# Patient Record
Sex: Male | Born: 1965 | Race: Black or African American | Hispanic: No | Marital: Single | Smoking: Never smoker
Health system: Southern US, Community
[De-identification: ages and names within clinical notes are randomized; demographics above are authoritative.]

## PROBLEM LIST (undated history)

## (undated) DIAGNOSIS — I219 Acute myocardial infarction, unspecified: Secondary | ICD-10-CM

## (undated) DIAGNOSIS — Z9289 Personal history of other medical treatment: Secondary | ICD-10-CM

## (undated) DIAGNOSIS — I442 Atrioventricular block, complete: Secondary | ICD-10-CM

## (undated) DIAGNOSIS — I214 Non-ST elevation (NSTEMI) myocardial infarction: Secondary | ICD-10-CM

## (undated) DIAGNOSIS — F419 Anxiety disorder, unspecified: Secondary | ICD-10-CM

## (undated) DIAGNOSIS — F32A Depression, unspecified: Secondary | ICD-10-CM

## (undated) DIAGNOSIS — I10 Essential (primary) hypertension: Secondary | ICD-10-CM

## (undated) DIAGNOSIS — F329 Major depressive disorder, single episode, unspecified: Secondary | ICD-10-CM

## (undated) DIAGNOSIS — T8859XA Other complications of anesthesia, initial encounter: Secondary | ICD-10-CM

## (undated) DIAGNOSIS — T4145XA Adverse effect of unspecified anesthetic, initial encounter: Secondary | ICD-10-CM

## (undated) DIAGNOSIS — I255 Ischemic cardiomyopathy: Secondary | ICD-10-CM

## (undated) DIAGNOSIS — I639 Cerebral infarction, unspecified: Secondary | ICD-10-CM

## (undated) DIAGNOSIS — I499 Cardiac arrhythmia, unspecified: Secondary | ICD-10-CM

## (undated) DIAGNOSIS — N189 Chronic kidney disease, unspecified: Secondary | ICD-10-CM

## (undated) DIAGNOSIS — J189 Pneumonia, unspecified organism: Secondary | ICD-10-CM

## (undated) DIAGNOSIS — K219 Gastro-esophageal reflux disease without esophagitis: Secondary | ICD-10-CM

## (undated) DIAGNOSIS — H547 Unspecified visual loss: Secondary | ICD-10-CM

## (undated) HISTORY — PX: ARTERIOVENOUS GRAFT PLACEMENT: SUR1029

## (undated) HISTORY — PX: CARDIAC CATHETERIZATION: SHX172

## (undated) HISTORY — PX: PERITONEAL CATHETER REMOVAL: SUR1106

## (undated) HISTORY — PX: NEPHROSTOMY: SHX1014

## (undated) HISTORY — PX: PERITONEAL CATHETER INSERTION: SHX2223

## (undated) HISTORY — PX: COLON RESECTION: SHX5231

## (undated) HISTORY — PX: OTHER SURGICAL HISTORY: SHX169

## (undated) HISTORY — PX: KIDNEY TRANSPLANT: SHX239

---

## 1998-04-09 ENCOUNTER — Ambulatory Visit (HOSPITAL_COMMUNITY): Admission: RE | Admit: 1998-04-09 | Discharge: 1998-04-09 | Payer: Self-pay | Admitting: *Deleted

## 1998-04-10 ENCOUNTER — Ambulatory Visit (HOSPITAL_COMMUNITY): Admission: RE | Admit: 1998-04-10 | Discharge: 1998-04-10 | Payer: Self-pay | Admitting: *Deleted

## 1998-06-28 ENCOUNTER — Emergency Department (HOSPITAL_COMMUNITY): Admission: EM | Admit: 1998-06-28 | Discharge: 1998-06-28 | Payer: Self-pay | Admitting: Emergency Medicine

## 1998-09-14 ENCOUNTER — Ambulatory Visit: Admission: RE | Admit: 1998-09-14 | Discharge: 1998-09-14 | Payer: Self-pay | Admitting: Nephrology

## 1998-10-07 ENCOUNTER — Ambulatory Visit (HOSPITAL_COMMUNITY): Admission: RE | Admit: 1998-10-07 | Discharge: 1998-10-07 | Payer: Self-pay | Admitting: Vascular Surgery

## 1998-10-18 HISTORY — PX: ARTERIOVENOUS GRAFT PLACEMENT: SUR1029

## 1998-10-27 DIAGNOSIS — I219 Acute myocardial infarction, unspecified: Secondary | ICD-10-CM

## 1998-10-27 HISTORY — DX: Acute myocardial infarction, unspecified: I21.9

## 1998-11-06 ENCOUNTER — Inpatient Hospital Stay (HOSPITAL_COMMUNITY): Admission: RE | Admit: 1998-11-06 | Discharge: 1998-11-09 | Payer: Self-pay

## 1998-11-07 HISTORY — PX: ARTERIOVENOUS GRAFT PLACEMENT: SUR1029

## 1999-04-07 ENCOUNTER — Inpatient Hospital Stay (HOSPITAL_COMMUNITY): Admission: EM | Admit: 1999-04-07 | Discharge: 1999-04-13 | Payer: Self-pay | Admitting: Emergency Medicine

## 1999-04-07 ENCOUNTER — Encounter: Payer: Self-pay | Admitting: Nephrology

## 1999-04-12 ENCOUNTER — Encounter: Payer: Self-pay | Admitting: Emergency Medicine

## 1999-05-15 ENCOUNTER — Inpatient Hospital Stay (HOSPITAL_COMMUNITY): Admission: AD | Admit: 1999-05-15 | Discharge: 1999-05-18 | Payer: Self-pay | Admitting: Nephrology

## 2001-01-18 ENCOUNTER — Encounter: Payer: Self-pay | Admitting: General Surgery

## 2001-01-20 ENCOUNTER — Encounter: Payer: Self-pay | Admitting: General Surgery

## 2001-01-20 ENCOUNTER — Inpatient Hospital Stay (HOSPITAL_COMMUNITY): Admission: RE | Admit: 2001-01-20 | Discharge: 2001-01-30 | Payer: Self-pay | Admitting: General Surgery

## 2001-01-20 ENCOUNTER — Encounter (INDEPENDENT_AMBULATORY_CARE_PROVIDER_SITE_OTHER): Payer: Self-pay | Admitting: Specialist

## 2001-01-28 ENCOUNTER — Encounter: Payer: Self-pay | Admitting: General Surgery

## 2001-10-27 DIAGNOSIS — I639 Cerebral infarction, unspecified: Secondary | ICD-10-CM

## 2001-10-27 HISTORY — DX: Cerebral infarction, unspecified: I63.9

## 2004-08-10 ENCOUNTER — Ambulatory Visit: Payer: Self-pay | Admitting: Nephrology

## 2004-08-20 ENCOUNTER — Ambulatory Visit: Payer: Self-pay

## 2004-09-17 ENCOUNTER — Ambulatory Visit: Payer: Self-pay | Admitting: Nephrology

## 2004-09-26 ENCOUNTER — Ambulatory Visit: Payer: Self-pay | Admitting: Nephrology

## 2004-11-02 ENCOUNTER — Ambulatory Visit: Payer: Self-pay | Admitting: Nephrology

## 2004-11-05 ENCOUNTER — Ambulatory Visit: Payer: Self-pay | Admitting: Nephrology

## 2004-11-07 ENCOUNTER — Ambulatory Visit: Payer: Self-pay | Admitting: Nephrology

## 2004-11-12 ENCOUNTER — Ambulatory Visit: Payer: Self-pay | Admitting: Nephrology

## 2004-11-16 ENCOUNTER — Ambulatory Visit: Payer: Self-pay | Admitting: Nephrology

## 2004-11-21 ENCOUNTER — Ambulatory Visit: Payer: Self-pay | Admitting: Nephrology

## 2004-11-23 ENCOUNTER — Ambulatory Visit: Payer: Self-pay | Admitting: Nephrology

## 2004-11-26 ENCOUNTER — Ambulatory Visit: Payer: Self-pay | Admitting: Nephrology

## 2006-03-26 ENCOUNTER — Ambulatory Visit: Payer: Self-pay | Admitting: Nephrology

## 2007-03-04 ENCOUNTER — Ambulatory Visit: Payer: Self-pay | Admitting: Nephrology

## 2007-03-05 ENCOUNTER — Ambulatory Visit (HOSPITAL_COMMUNITY): Admission: RE | Admit: 2007-03-05 | Discharge: 2007-03-05 | Payer: Self-pay | Admitting: Nephrology

## 2007-05-08 ENCOUNTER — Ambulatory Visit: Payer: Self-pay | Admitting: Nephrology

## 2007-06-05 ENCOUNTER — Ambulatory Visit: Payer: Self-pay | Admitting: Nephrology

## 2007-06-08 ENCOUNTER — Ambulatory Visit: Payer: Self-pay | Admitting: Nephrology

## 2007-08-07 ENCOUNTER — Ambulatory Visit: Payer: Self-pay | Admitting: Nephrology

## 2007-10-16 ENCOUNTER — Ambulatory Visit: Payer: Self-pay | Admitting: Nephrology

## 2007-12-18 ENCOUNTER — Ambulatory Visit: Payer: Self-pay | Admitting: Nephrology

## 2008-03-25 ENCOUNTER — Ambulatory Visit: Payer: Self-pay | Admitting: Nephrology

## 2008-03-28 ENCOUNTER — Ambulatory Visit: Payer: Self-pay | Admitting: Nephrology

## 2008-09-30 ENCOUNTER — Ambulatory Visit: Payer: Self-pay | Admitting: Nephrology

## 2008-11-21 ENCOUNTER — Ambulatory Visit: Payer: Self-pay | Admitting: Nephrology

## 2008-12-22 ENCOUNTER — Ambulatory Visit: Payer: Self-pay | Admitting: Nephrology

## 2008-12-23 ENCOUNTER — Ambulatory Visit (HOSPITAL_COMMUNITY): Admission: RE | Admit: 2008-12-23 | Discharge: 2008-12-23 | Payer: Self-pay | Admitting: Nephrology

## 2008-12-25 ENCOUNTER — Ambulatory Visit (HOSPITAL_COMMUNITY): Admission: RE | Admit: 2008-12-25 | Discharge: 2008-12-25 | Payer: Self-pay | Admitting: Nephrology

## 2009-01-06 ENCOUNTER — Ambulatory Visit: Payer: Self-pay | Admitting: Nephrology

## 2009-02-02 ENCOUNTER — Ambulatory Visit: Payer: Self-pay | Admitting: Vascular Surgery

## 2009-02-06 ENCOUNTER — Ambulatory Visit: Payer: Self-pay | Admitting: Nephrology

## 2009-03-02 ENCOUNTER — Ambulatory Visit: Payer: Self-pay | Admitting: Vascular Surgery

## 2009-03-30 ENCOUNTER — Ambulatory Visit: Payer: Self-pay | Admitting: Vascular Surgery

## 2009-05-16 ENCOUNTER — Ambulatory Visit: Payer: Self-pay | Admitting: Vascular Surgery

## 2009-08-17 ENCOUNTER — Ambulatory Visit: Payer: Self-pay | Admitting: Vascular Surgery

## 2009-08-29 ENCOUNTER — Inpatient Hospital Stay (HOSPITAL_COMMUNITY): Admission: EM | Admit: 2009-08-29 | Discharge: 2009-09-05 | Payer: Self-pay | Admitting: Emergency Medicine

## 2009-09-06 ENCOUNTER — Ambulatory Visit: Payer: Self-pay | Admitting: Nephrology

## 2009-09-17 ENCOUNTER — Ambulatory Visit: Payer: Self-pay | Admitting: Nephrology

## 2009-11-01 ENCOUNTER — Ambulatory Visit: Payer: Self-pay | Admitting: Nephrology

## 2009-11-05 ENCOUNTER — Ambulatory Visit: Payer: Self-pay | Admitting: Vascular Surgery

## 2009-11-21 ENCOUNTER — Ambulatory Visit: Payer: Self-pay | Admitting: Vascular Surgery

## 2009-11-30 ENCOUNTER — Ambulatory Visit: Payer: Self-pay | Admitting: Vascular Surgery

## 2009-11-30 ENCOUNTER — Ambulatory Visit (HOSPITAL_COMMUNITY): Admission: RE | Admit: 2009-11-30 | Discharge: 2009-11-30 | Payer: Self-pay | Admitting: Vascular Surgery

## 2009-12-28 ENCOUNTER — Ambulatory Visit (HOSPITAL_COMMUNITY): Admission: RE | Admit: 2009-12-28 | Discharge: 2009-12-28 | Payer: Self-pay | Admitting: Nephrology

## 2010-01-07 ENCOUNTER — Ambulatory Visit: Payer: Self-pay | Admitting: Surgery

## 2010-01-08 ENCOUNTER — Ambulatory Visit: Payer: Self-pay | Admitting: Nephrology

## 2010-02-01 ENCOUNTER — Ambulatory Visit: Payer: Self-pay | Admitting: Pulmonary Disease

## 2010-02-01 ENCOUNTER — Inpatient Hospital Stay (HOSPITAL_COMMUNITY): Admission: EM | Admit: 2010-02-01 | Discharge: 2010-02-09 | Payer: Self-pay | Admitting: Emergency Medicine

## 2010-02-01 ENCOUNTER — Ambulatory Visit: Payer: Self-pay | Admitting: Cardiovascular Disease

## 2010-02-04 ENCOUNTER — Ambulatory Visit: Payer: Self-pay | Admitting: Infectious Diseases

## 2010-02-14 ENCOUNTER — Ambulatory Visit: Payer: Self-pay | Admitting: Nephrology

## 2010-03-08 ENCOUNTER — Ambulatory Visit (HOSPITAL_COMMUNITY)
Admission: RE | Admit: 2010-03-08 | Discharge: 2010-03-08 | Payer: Self-pay | Source: Home / Self Care | Admitting: Nephrology

## 2010-04-03 ENCOUNTER — Ambulatory Visit: Payer: Self-pay | Admitting: Vascular Surgery

## 2010-04-10 ENCOUNTER — Ambulatory Visit: Payer: Self-pay | Admitting: Vascular Surgery

## 2010-04-19 ENCOUNTER — Ambulatory Visit (HOSPITAL_COMMUNITY): Admission: RE | Admit: 2010-04-19 | Discharge: 2010-04-19 | Payer: Self-pay | Admitting: Vascular Surgery

## 2010-05-01 ENCOUNTER — Emergency Department: Payer: Self-pay | Admitting: Emergency Medicine

## 2010-05-01 ENCOUNTER — Inpatient Hospital Stay: Payer: Self-pay | Admitting: Internal Medicine

## 2010-05-05 ENCOUNTER — Inpatient Hospital Stay (HOSPITAL_COMMUNITY): Admission: EM | Admit: 2010-05-05 | Discharge: 2010-05-16 | Payer: Self-pay | Admitting: Pulmonary Disease

## 2010-05-05 ENCOUNTER — Ambulatory Visit: Payer: Self-pay | Admitting: Surgery

## 2010-05-05 ENCOUNTER — Ambulatory Visit: Payer: Self-pay | Admitting: Pulmonary Disease

## 2010-07-30 ENCOUNTER — Ambulatory Visit (HOSPITAL_COMMUNITY): Admission: RE | Admit: 2010-07-30 | Discharge: 2010-07-30 | Payer: Self-pay | Admitting: Vascular Surgery

## 2010-07-30 ENCOUNTER — Ambulatory Visit: Payer: Self-pay | Admitting: Vascular Surgery

## 2010-08-01 ENCOUNTER — Ambulatory Visit (HOSPITAL_COMMUNITY): Admission: RE | Admit: 2010-08-01 | Discharge: 2010-08-01 | Payer: Self-pay | Admitting: Vascular Surgery

## 2010-08-09 ENCOUNTER — Ambulatory Visit: Payer: Self-pay | Admitting: Vascular Surgery

## 2010-08-19 ENCOUNTER — Ambulatory Visit (HOSPITAL_COMMUNITY): Admission: RE | Admit: 2010-08-19 | Discharge: 2010-08-19 | Payer: Self-pay | Admitting: Vascular Surgery

## 2010-09-06 ENCOUNTER — Ambulatory Visit: Payer: Self-pay | Admitting: Vascular Surgery

## 2010-10-11 ENCOUNTER — Ambulatory Visit (HOSPITAL_COMMUNITY): Admission: RE | Admit: 2010-10-11 | Payer: Self-pay | Source: Home / Self Care | Admitting: Vascular Surgery

## 2010-11-17 ENCOUNTER — Encounter: Payer: Self-pay | Admitting: Nephrology

## 2010-11-22 ENCOUNTER — Ambulatory Visit: Admit: 2010-11-22 | Payer: Self-pay | Admitting: Vascular Surgery

## 2010-11-30 ENCOUNTER — Emergency Department: Payer: Self-pay | Admitting: Emergency Medicine

## 2010-12-04 ENCOUNTER — Inpatient Hospital Stay (HOSPITAL_COMMUNITY)
Admission: RE | Admit: 2010-12-04 | Discharge: 2010-12-04 | DRG: 314 | Disposition: A | Discharge status: Home / Self Care | Payer: Medicare Other | Source: Ambulatory Visit | Attending: Vascular Surgery | Admitting: Vascular Surgery

## 2010-12-04 ENCOUNTER — Inpatient Hospital Stay (HOSPITAL_COMMUNITY): Payer: Medicare Other

## 2010-12-04 DIAGNOSIS — H548 Legal blindness, as defined in USA: Secondary | ICD-10-CM | POA: Diagnosis present

## 2010-12-04 DIAGNOSIS — T80212A Local infection due to central venous catheter, initial encounter: Secondary | ICD-10-CM

## 2010-12-04 DIAGNOSIS — Y849 Medical procedure, unspecified as the cause of abnormal reaction of the patient, or of later complication, without mention of misadventure at the time of the procedure: Secondary | ICD-10-CM | POA: Diagnosis present

## 2010-12-04 DIAGNOSIS — N186 End stage renal disease: Secondary | ICD-10-CM

## 2010-12-04 DIAGNOSIS — T82898A Other specified complication of vascular prosthetic devices, implants and grafts, initial encounter: Secondary | ICD-10-CM

## 2010-12-04 DIAGNOSIS — I509 Heart failure, unspecified: Secondary | ICD-10-CM | POA: Diagnosis present

## 2010-12-04 DIAGNOSIS — Z9861 Coronary angioplasty status: Secondary | ICD-10-CM

## 2010-12-04 DIAGNOSIS — I120 Hypertensive chronic kidney disease with stage 5 chronic kidney disease or end stage renal disease: Secondary | ICD-10-CM

## 2010-12-04 DIAGNOSIS — Z940 Kidney transplant status: Secondary | ICD-10-CM

## 2010-12-04 DIAGNOSIS — I2510 Atherosclerotic heart disease of native coronary artery without angina pectoris: Secondary | ICD-10-CM | POA: Diagnosis present

## 2010-12-04 LAB — POCT I-STAT 4, (NA,K, GLUC, HGB,HCT)
Glucose, Bld: 98 mg/dL (ref 70–99)
Sodium: 138 mEq/L (ref 135–145)

## 2010-12-04 LAB — PROTIME-INR: Prothrombin Time: 16.2 seconds — ABNORMAL HIGH (ref 11.6–15.2)

## 2010-12-04 LAB — SURGICAL PCR SCREEN: Staphylococcus aureus: NEGATIVE

## 2010-12-06 LAB — CATH TIP CULTURE

## 2010-12-11 NOTE — Op Note (Signed)
NAME:  [name] NO.:  [id number]  MEDICAL RECORD NO.:  [id number]           PATIENT TYPE:  I  LOCATION:  2899                         FACILITY:  [facility]  PHYSICIAN:  Fransisco Hertz, MD       DATE OF BIRTH:  [birth year]  DATE OF PROCEDURE:  12/04/2010 DATE OF DISCHARGE:  12/04/2010                              OPERATIVE REPORT   PROCEDURE:  Exchange of right femoral tunneled dialysis catheter x 2.  PREOPERATIVE DIAGNOSIS:  Possible infected right tunneled dialysis catheter.  POSTOPERATIVE DIAGNOSIS:  Possible infected right tunneled dialysis catheter.  SURGEON:  Arlys John L. Imogene Burn, MD  ANESTHESIA:  Monitored anesthesia care and local.  ESTIMATED BLOOD LOSS:  Minimal.  FINDINGS: tips of the tunneled dialysis catheter in the right atrium and early clotting of first tunneled dialysis catheter placed.  SPECIMEN: tip of tunneled dialysis catheter for anaerobic and aerobic cultures.  INDICATIONS:  This is a 45 year old gentleman who has end-stage renal disease requiring hemodialysis.  He previously has undergone bilateral upper arm graft placements.  At this point, his options in his upper arm are exhausted.  He also had a right femoral graft placed previously that has thrombosed.  His only option at this point is a left thigh graft.  He previously has had a right femoral tunneled dialysis catheter placed. Per discussion with the patient, he was currently getting antibiotics with dialysis with concern of possible graft infection.  This morning also he had a mild temperature at 100.2 F.  I had concerns with placement  of a thigh graft in the setting of possibly infected tunneled dialysis catheter. So I discussed with this patient exchanging out the tunneled dialysis catheter  and possibly placement of a temporary dialysis catheter if I felt that the findings  were consistent with an infected tunneled dialysis catheter.  He was fine with proceeding  with just an  exchange of the catheter.  DESCRIPTION OF OPERATION:  After full informed written consent was obtained, the patient was brought back to the operating room, placed supine upon the operating table.  He was prepped and draped in the standard fashion for right femoral tunneled dialysis catheter exchange. The entirety of the catheter was prepped into the field.  I had originally planned to possibly cut down onto the catheter, but it became evident immediately that the radiologists had placed this tunneled dialysis catheter immediately adjacent to the previous arteriovenous graft and that if I cut down to the catheter I would in fact be exposing the femoral graft, so this was not an option.  I opened one of the ports of this catheter. It became apparent to me that the blue port was in fact clotted, the red port however had blood flow that could be aspirated, so I passed a wire up through this port.  Under fluoroscopic guidance, this wire came out the end of the catheter up into the right atrium/inferior vena cava junction.  Over the wire, this catheter was removed leaving the wire in place.  I then at this point had a 55-cm Diatek catheter placed over the wire.  This  was advanced over the wire back into the inferior vena cava and under fluoroscopic guidance advanced back into the right atrium/inferior vena cava boundary.  At this point, then I transected the back end of this catheter revealing the two lumens.  The two ports were then docked onto this and then the catheter hub was then screwed into place.  I aspirated the red port.  It appeared flush and aspirated without any difficulties.  The blue port, however, initially I was sucking essential clot out of this and eventually it clotted off altogether and I was no longer able to aspirate.  At this point, I had some concern that there was presence of some clot within the catheter and then flushing it out would in fact result in  embolization, so I drew the red port which was aspirated and flushed without any difficulties.  I then placed a wire through this catheter.  This catheter was then exchanged for another catheter.  The removed catheter demonstrated clot  at the end of the catheter.  In a similar fashion, I had placed a new 55-cm Diatek  catheter over the wire and advanced it back in the right atrium.  In a similar  fashion, I transected the back end of this catheter and connected it to the two ports, and then at this point I was able to flush and aspirate both lumens of this catheter.  Initially, I did once again get a small amount of clot in the blue port after rotating it slightly; however, I was able to aspirate freely and flush without any difficulties.  Each port was then flushed with heparinized saline.  At this point immediately loaded each port with concentrated heparin at 1000 units per milliliter at the manufacturer recommended volumes and sterilely capped off each port.  I then secured this catheter in place with the cuff about a centimeter away from the exit point with interrupted stitches of 3-0 nylon tied to the catheter.  The patient tolerated this without any difficulties and then we applied sterile bandages to his right groin.  COMPLICATIONS:  None.  CONDITION:  Stable.  I then after completing this case, I called the patient's nephrologist and discussed the findings on this case.  The feeling was that there was no clear-cut evidence of an infected tunneled dialysis catheter.  I had passed off the tops of the original tunneled dialysis catheter as a specimen  for anaerobic and aerobic cultures, so we will be awaiting the outcomes of  those cultures.  Meanwhile, the nephrologist will continue with outpatient  dialysis and antibiotics during that period.  I also strongly recommended the  patient once again resume his Coumadin.  I discussed the findings of clot on  aspiration of the  catheter with the nephrologist.  They felt that the best  route of action in this case would be to have him resume his anticoagulation. Unfortunately due to the patient's end-stage renal status, he is not a candidate for the subcutaneous administration of either Lovenox or Arixtra.  Additionally as he is an access challenge and his only other remaining easily accessible access would be his left femoral vein which is the vein to be used for his permanent access.  Placement of a access in this vein would compromise that surgical option.  Placement of such access would have been necessary to start him on IV heparin, so I discussed options with the nephrologist and the feeling at this point was that he would best off  just being anticoagulated on Coumadin.     Fransisco Hertz, MD     BLC/MEDQ  D:  12/04/2010  T:  12/05/2010  Job:  [id number]  Electronically Signed by Leonides Sake MD on 12/11/2010 10:35:41 AM

## 2010-12-14 ENCOUNTER — Other Ambulatory Visit: Payer: Self-pay | Admitting: Nephrology

## 2010-12-19 ENCOUNTER — Other Ambulatory Visit: Payer: Self-pay

## 2010-12-20 ENCOUNTER — Ambulatory Visit: Payer: Medicare Other | Admitting: Vascular Surgery

## 2011-01-01 ENCOUNTER — Inpatient Hospital Stay (HOSPITAL_COMMUNITY): Payer: Medicare Other

## 2011-01-01 ENCOUNTER — Inpatient Hospital Stay (HOSPITAL_COMMUNITY)
Admission: RE | Admit: 2011-01-01 | Discharge: 2011-01-02 | DRG: 673 | Disposition: A | Discharge status: Home / Self Care | Payer: Medicare Other | Source: Ambulatory Visit | Attending: Vascular Surgery | Admitting: Vascular Surgery

## 2011-01-01 DIAGNOSIS — Z992 Dependence on renal dialysis: Secondary | ICD-10-CM

## 2011-01-01 DIAGNOSIS — Z9861 Coronary angioplasty status: Secondary | ICD-10-CM

## 2011-01-01 DIAGNOSIS — T82898A Other specified complication of vascular prosthetic devices, implants and grafts, initial encounter: Secondary | ICD-10-CM

## 2011-01-01 DIAGNOSIS — N186 End stage renal disease: Secondary | ICD-10-CM | POA: Diagnosis present

## 2011-01-01 DIAGNOSIS — E875 Hyperkalemia: Secondary | ICD-10-CM | POA: Diagnosis present

## 2011-01-01 DIAGNOSIS — I120 Hypertensive chronic kidney disease with stage 5 chronic kidney disease or end stage renal disease: Principal | ICD-10-CM | POA: Diagnosis present

## 2011-01-01 DIAGNOSIS — N2581 Secondary hyperparathyroidism of renal origin: Secondary | ICD-10-CM | POA: Diagnosis present

## 2011-01-01 DIAGNOSIS — D649 Anemia, unspecified: Secondary | ICD-10-CM | POA: Diagnosis present

## 2011-01-01 DIAGNOSIS — I2510 Atherosclerotic heart disease of native coronary artery without angina pectoris: Secondary | ICD-10-CM | POA: Diagnosis present

## 2011-01-01 LAB — POCT I-STAT 4, (NA,K, GLUC, HGB,HCT)
Glucose, Bld: 89 mg/dL (ref 70–99)
Hemoglobin: 10.2 g/dL — ABNORMAL LOW (ref 13.0–17.0)
Potassium: 4.3 mEq/L (ref 3.5–5.1)

## 2011-01-02 ENCOUNTER — Inpatient Hospital Stay (HOSPITAL_COMMUNITY): Payer: Medicare Other

## 2011-01-02 LAB — CBC
HCT: 29.9 % — ABNORMAL LOW (ref 39.0–52.0)
Hemoglobin: 9 g/dL — ABNORMAL LOW (ref 13.0–17.0)
RBC: 3.18 MIL/uL — ABNORMAL LOW (ref 4.22–5.81)
RDW: 17.1 % — ABNORMAL HIGH (ref 11.5–15.5)
WBC: 7.2 10*3/uL (ref 4.0–10.5)

## 2011-01-02 LAB — POTASSIUM: Potassium: 3.1 mEq/L — ABNORMAL LOW (ref 3.5–5.1)

## 2011-01-03 LAB — RENAL FUNCTION PANEL
Chloride: 97 mEq/L (ref 96–112)
Glucose, Bld: 74 mg/dL (ref 70–99)
Phosphorus: 5.1 mg/dL — ABNORMAL HIGH (ref 2.3–4.6)
Potassium: >7.5 mEq/L (ref 3.5–5.1)
Sodium: 135 mEq/L (ref 135–145)

## 2011-01-03 NOTE — Op Note (Signed)
NAME:  [name], [name] NO.:  [id number]  MEDICAL RECORD NO.:  [id number]           PATIENT TYPE:  I  LOCATION:  2002                         FACILITY:  [facility]  PHYSICIAN:  Fransisco Hertz, MD       DATE OF BIRTH:  Nov 15, 1965  DATE OF PROCEDURE:  01/01/2011 DATE OF DISCHARGE:                              OPERATIVE REPORT   PROCEDURE:   1. Creation of left thigh arteriovenous graft and inferior vena cavogram. 2. Left iliac venogram 3. Inferior vena cavogram  SURGEON:  Fransisco Hertz, MD  ASSISTANT:  Della Goo, PA-C  ANESTHESIA:  General.  FINDINGS: at the end of the case a palpable pulse in his left foot and also a palpable thrill in his femoral vein.  SPECIMENS:  None.  ESTIMATED BLOOD LOSS:  100 mL.  COMPLICATIONS:  None.  CONDITION:  Stable.  INDICATIONS:  This is a 45 year old gentleman who has now unfortunately undergone multiple bilateral upper extremity access procedures and also right thigh graft.  His only remaining option was placement of a left thigh graft.  Previously, I performed venograms on him that demonstrated patent iliac venous system even though there is an area of narrowing about 6 mm in the left iliac system.  Unfortunately, this gentleman continues to have multiple medical problems which have resulted in cancelation of nearly three of his previous attempts at placing the thigh graft.  He came in today finely afebrile, with no active signs of any type of infection.  I felt due to some issues in regard to clot from a previous right femoral tunneled dialysis exchange that an inferior vena cavogram would be necessary in order to determine whether or not placement of this graft would be of any value to him as if he had an inferior vena caval stenosis or thrombosis that any graft placement would be fundamentally flawed.  He is aware of the risks of this procedure includes bleeding, infection, possible embolization of clot to his  lung, possible steal syndrome resulting in malperfusion of his left leg, possible need for additional procedures, and possible nerve damage.  He is aware of all these risks and agreed to proceed forward with such.  DESCRIPTION OF THE OPERATION:  After full informed written consent was obtained from the patient, he was brought back to the operating room, placed supine upon the operating table.  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  Both of his legs had to be propped upon pillows as he could not fully extend his leg.  There is a permanent contracture in both legs and arms, so extensive amount of cushioning was needed in order to secure his body from developing any contracture-related injuries.  Prior to induction, he had received IV antibiotics.  He was then prepped and draped in the standard fashion for a left groin and thigh graft after obtaining adequate anesthesia, which was difficult also.  I turned my attention first to his left groin.  I made a longitudinal incision over where I felt the common femoral artery to be. Using  blunt dissection and electrocautery, I developed a plane into the area of the femoral sheath.  Note, there was significant scar tissue everywhere.  Due to his fibrosis related to his gadolinium reaction, this was the equivalent of a redo exploration and this dissection was extremely difficult.  Eventually, I was able to identify the femoral sheath and opened it up.  I was able to dissect out first the common femoral vein and then dissected it distally down to the saphenofemoral junction. The saphenous vein was noted to be relatively small at about 4 mm so I did not feel I could use the distal saphenous as inflow.  I was able to dissect out enough of the saphenous vein entereing into the common femoral vein  to utilize a branching point which I felt that once spatulated would be about  5-6 mm in diameter.  I at this point cannulated the saphenous vein distal to this point  that I was going to use as the outflow and then placed in a microwire through the microneedle and then exchanged the micro needle for the micro sheath, connected the extension tubing and then I completed this patient's inferior vena cavogram and also the left iliac venogram.  This demonstrated no obvious stenosis or any clot within this patient's superior vena cava.  At this point, I felt that his outflow was patent, so at this point it made sense to make an attempt at placement of this graft.  It took almost another 2 hours to dissect out the common femoral artery.  This is extremely difficult and multiple venous or arterial side  branches had to be repaired in this dissection.  Eventually, I was able  to get down to a segment of the common femoral artery just distal to the inguinal ligament that was patent without any disease.  At this point, I marked out on the skin the route for a thigh graft, and took a 6-mm Propaten graft, stretched to the full length, and then marked out the route.  I then made stab incisions medially and laterally and dissected little pockets.  I then using metal tunneler dissected from the lateral tunnel to the groin and then placed the metal tunnel.  I then passed the graft from the groin to the lateral incision. Then taking a curved metal tunneler, I dissected from the lateral incision to the medial incision placing the metal tunneler and then placed a graft through this metal tunnel.  Then, I finally completed the route by tunneling from the groin to the medial incision and then passed the graft.  In this process, I maintained orientation of the graft.  The patient was given 5000 units of heparin intravenously for a therapeutic bolus.  After waiting 3 minutes, then I clamped the artery proximally and distally, and the left common femoral artery and made an arteriotomy with  11 blade, extended it with Potts scissor for about a 6-mm arteriotomy, spatulated the graft, and meet the dimensions of this arteriotomy.  The graft was sewn to the artery in an end-to-side fashion with running stitch of 5-0 Prolene.  I had completed this anastomosis. Prior to completing it, I pulled up on the suture line with a nerve hook and then tied it down as usual.  I then allowed the graft to pressurize. After releasing all the clamps, there was good pulsatile flow throughout this graft.  At this point, then I clamped the graft near its arterial anastomosis and then sucked out all the blood,  then instilled heparinized saline throughout this graft.  At this point, then I turned my attention to the saphenous vein.  I clamped the saphenous vein as it entered into the saphenofemoral junction and I removed the sheath, and at this point I tied off the two branches of the saphenous vein and then transected the saphenous vein.  I opened up the proximal saphenous vein through this bifurcation point and this was easily a lumen greater than 6 mm.  I then passed a 5-mm dilator through this open saphenous vein. There was a little bit of clot that came out immediately and a little bit of resistance with passing the 5 mm, but after allowing the clot out and readjusting where I clamped it, actually the 5 mm passed without any difficulties.  There was good backbleeding from the vein, so at this point I reclamped the saphenofemoral junction in a more deep position. I irrigated out the saphenous vein and then I anastomosed the graft to the vein in end-to-end fashion after cutting the graft to appropriate length.  Prior to completing anastomosis, I allowed the vein to back bleed  and I blew out the graft in an antegrade fashion and then filled the graft up with heparinized saline.  At this point, the anastomosis was completed in the usual fashion.  I then released all the clamps and there  was immediately a good thrill in the femoral vein and also there was a palpable pulse in the graft.  At this point, we applied thrombin and Gelfoam to all anastomoses, gave the patient 15 mg of protamine initially to start reversing the heparin.  After waiting another 3 minutes, there continued to be some raw surface bleeding, so I gave another 15 mg of protamine and then irrigated out the wound and applied more thrombin and Gelfoam.  There were few areas of diffuse surface bleeding that I controlled with electrocautery.  At this point, I irrigated out the groin.  There was no more active bleeding.  There was some limited raw surface bleeding which I felt would stop with reapproximation of tissue, so then I repaired the left groin with a double layer of 2-0 Vicryl in a running fashion.  Then, I placed an interrupted layer of 3-0 Vicryl in the subdermal tissue to reapproximate that tissue.  I then closed the skin with a running 4-0 Monocryl in a running subcuticular fashion.  The skin was then cleaned, dried at the groin and reinforced with Dermabond.  I then turned my attention to the two stab incisions that were repaired with a layer of 3-0 Vicryl and then the skin reapproximated with running subcuticular of 4-0 Monocryl.  The skin was cleaned, dried, and also reinforced with Dermabond.  COMPLICATIONS:  None.  CONDITION:  Stable. Fransisco Hertz, MD     BLC/MEDQ  D:  01/01/2011  T:  01/02/2011  Job:  [id number]  Electronically Signed by Leonides Sake MD on 01/02/2011 10:19:31 AM

## 2011-01-04 ENCOUNTER — Inpatient Hospital Stay: Payer: Self-pay | Admitting: Internal Medicine

## 2011-01-05 ENCOUNTER — Inpatient Hospital Stay (HOSPITAL_COMMUNITY): Payer: Medicare Other

## 2011-01-05 ENCOUNTER — Inpatient Hospital Stay (HOSPITAL_COMMUNITY)
Admission: AD | Admit: 2011-01-05 | Discharge: 2011-01-17 | DRG: 314 | Disposition: A | Discharge status: Home Health | Payer: Medicare Other | Source: Other Acute Inpatient Hospital | Attending: Internal Medicine | Admitting: Internal Medicine

## 2011-01-05 DIAGNOSIS — T827XXA Infection and inflammatory reaction due to other cardiac and vascular devices, implants and grafts, initial encounter: Principal | ICD-10-CM | POA: Diagnosis present

## 2011-01-05 DIAGNOSIS — R894 Abnormal immunological findings in specimens from other organs, systems and tissues: Secondary | ICD-10-CM | POA: Diagnosis present

## 2011-01-05 DIAGNOSIS — A419 Sepsis, unspecified organism: Secondary | ICD-10-CM | POA: Diagnosis present

## 2011-01-05 DIAGNOSIS — Z9861 Coronary angioplasty status: Secondary | ICD-10-CM

## 2011-01-05 DIAGNOSIS — I120 Hypertensive chronic kidney disease with stage 5 chronic kidney disease or end stage renal disease: Secondary | ICD-10-CM | POA: Diagnosis present

## 2011-01-05 DIAGNOSIS — Y841 Kidney dialysis as the cause of abnormal reaction of the patient, or of later complication, without mention of misadventure at the time of the procedure: Secondary | ICD-10-CM | POA: Diagnosis present

## 2011-01-05 DIAGNOSIS — T8131XA Disruption of external operation (surgical) wound, not elsewhere classified, initial encounter: Secondary | ICD-10-CM | POA: Diagnosis not present

## 2011-01-05 DIAGNOSIS — N039 Chronic nephritic syndrome with unspecified morphologic changes: Secondary | ICD-10-CM | POA: Diagnosis present

## 2011-01-05 DIAGNOSIS — D631 Anemia in chronic kidney disease: Secondary | ICD-10-CM | POA: Diagnosis present

## 2011-01-05 DIAGNOSIS — R7989 Other specified abnormal findings of blood chemistry: Secondary | ICD-10-CM

## 2011-01-05 DIAGNOSIS — N186 End stage renal disease: Secondary | ICD-10-CM | POA: Diagnosis present

## 2011-01-05 DIAGNOSIS — I252 Old myocardial infarction: Secondary | ICD-10-CM

## 2011-01-05 DIAGNOSIS — H543 Unqualified visual loss, both eyes: Secondary | ICD-10-CM | POA: Diagnosis present

## 2011-01-05 DIAGNOSIS — R6520 Severe sepsis without septic shock: Secondary | ICD-10-CM | POA: Diagnosis present

## 2011-01-05 DIAGNOSIS — G4700 Insomnia, unspecified: Secondary | ICD-10-CM | POA: Diagnosis not present

## 2011-01-05 DIAGNOSIS — E785 Hyperlipidemia, unspecified: Secondary | ICD-10-CM | POA: Diagnosis present

## 2011-01-05 DIAGNOSIS — K219 Gastro-esophageal reflux disease without esophagitis: Secondary | ICD-10-CM | POA: Diagnosis present

## 2011-01-05 DIAGNOSIS — I509 Heart failure, unspecified: Secondary | ICD-10-CM | POA: Diagnosis present

## 2011-01-05 DIAGNOSIS — N2581 Secondary hyperparathyroidism of renal origin: Secondary | ICD-10-CM | POA: Diagnosis present

## 2011-01-05 DIAGNOSIS — Z992 Dependence on renal dialysis: Secondary | ICD-10-CM

## 2011-01-05 DIAGNOSIS — R6521 Severe sepsis with septic shock: Secondary | ICD-10-CM

## 2011-01-05 DIAGNOSIS — I2510 Atherosclerotic heart disease of native coronary artery without angina pectoris: Secondary | ICD-10-CM | POA: Diagnosis present

## 2011-01-05 DIAGNOSIS — I4891 Unspecified atrial fibrillation: Secondary | ICD-10-CM | POA: Diagnosis present

## 2011-01-05 LAB — CBC
HCT: 28.7 % — ABNORMAL LOW (ref 39.0–52.0)
Hemoglobin: 8.9 g/dL — ABNORMAL LOW (ref 13.0–17.0)
MCH: 28.4 pg (ref 26.0–34.0)
MCHC: 31 g/dL (ref 30.0–36.0)
MCV: 91.7 fL (ref 78.0–100.0)
Platelets: 208 10*3/uL (ref 150–400)
RBC: 3.13 MIL/uL — ABNORMAL LOW (ref 4.22–5.81)
RDW: 16.9 % — ABNORMAL HIGH (ref 11.5–15.5)
WBC: 12.2 10*3/uL — ABNORMAL HIGH (ref 4.0–10.5)

## 2011-01-05 LAB — CARDIAC PANEL(CRET KIN+CKTOT+MB+TROPI): CK, MB: 1.9 ng/mL (ref 0.3–4.0)

## 2011-01-05 LAB — BASIC METABOLIC PANEL
BUN: 34 mg/dL — ABNORMAL HIGH (ref 6–23)
CO2: 22 mEq/L (ref 19–32)
Calcium: 8.6 mg/dL (ref 8.4–10.5)
Chloride: 96 mEq/L (ref 96–112)
Creatinine, Ser: 10.7 mg/dL — ABNORMAL HIGH (ref 0.4–1.5)
GFR calc Af Amer: 6 mL/min — ABNORMAL LOW (ref >60)
GFR calc non Af Amer: 5 mL/min — ABNORMAL LOW (ref >60)
Glucose, Bld: 113 mg/dL — ABNORMAL HIGH (ref 70–99)
Potassium: 4.2 mEq/L (ref 3.5–5.1)
Sodium: 130 mEq/L — ABNORMAL LOW (ref 135–145)

## 2011-01-05 LAB — MRSA PCR SCREENING: MRSA by PCR: NEGATIVE

## 2011-01-05 LAB — GLUCOSE, CAPILLARY
Glucose-Capillary: 118 mg/dL — ABNORMAL HIGH (ref 70–99)
Glucose-Capillary: 101 mg/dL — ABNORMAL HIGH (ref 70–99)

## 2011-01-05 LAB — PROCALCITONIN: Procalcitonin: 164.4 ng/mL

## 2011-01-05 LAB — PHOSPHORUS: Phosphorus: 4.4 mg/dL (ref 2.3–4.6)

## 2011-01-05 LAB — MAGNESIUM: Magnesium: 2.9 mg/dL — ABNORMAL HIGH (ref 1.5–2.5)

## 2011-01-06 ENCOUNTER — Inpatient Hospital Stay (HOSPITAL_COMMUNITY): Payer: Medicare Other

## 2011-01-06 DIAGNOSIS — N186 End stage renal disease: Secondary | ICD-10-CM

## 2011-01-06 DIAGNOSIS — A419 Sepsis, unspecified organism: Secondary | ICD-10-CM

## 2011-01-06 DIAGNOSIS — T827XXA Infection and inflammatory reaction due to other cardiac and vascular devices, implants and grafts, initial encounter: Secondary | ICD-10-CM

## 2011-01-06 DIAGNOSIS — R6521 Severe sepsis with septic shock: Secondary | ICD-10-CM

## 2011-01-06 LAB — GLUCOSE, CAPILLARY
Glucose-Capillary: 104 mg/dL — ABNORMAL HIGH (ref 70–99)
Glucose-Capillary: 94 mg/dL (ref 70–99)
Glucose-Capillary: 95 mg/dL (ref 70–99)
Glucose-Capillary: 98 mg/dL (ref 70–99)

## 2011-01-06 LAB — CBC
HCT: 28.9 % — ABNORMAL LOW (ref 39.0–52.0)
Hemoglobin: 8.8 g/dL — ABNORMAL LOW (ref 13.0–17.0)
MCH: 27.8 pg (ref 26.0–34.0)
MCHC: 30.4 g/dL (ref 30.0–36.0)
MCV: 91.5 fL (ref 78.0–100.0)
Platelets: 233 10*3/uL (ref 150–400)
RBC: 3.16 MIL/uL — ABNORMAL LOW (ref 4.22–5.81)
RDW: 16.7 % — ABNORMAL HIGH (ref 11.5–15.5)
WBC: 12.1 10*3/uL — ABNORMAL HIGH (ref 4.0–10.5)

## 2011-01-06 LAB — CARDIAC PANEL(CRET KIN+CKTOT+MB+TROPI)
CK, MB: 2.2 ng/mL (ref 0.3–4.0)
Relative Index: INVALID (ref 0.0–2.5)
Total CK: 36 U/L (ref 7–232)
Total CK: 55 U/L (ref 7–232)
Troponin I: 0.15 ng/mL — ABNORMAL HIGH (ref 0.00–0.06)
Troponin I: 0.29 ng/mL — ABNORMAL HIGH (ref 0.00–0.06)

## 2011-01-06 LAB — PROTIME-INR: Prothrombin Time: 28.9 seconds — ABNORMAL HIGH (ref 11.6–15.2)

## 2011-01-06 LAB — RENAL FUNCTION PANEL
CO2: 19 mEq/L (ref 19–32)
Calcium: 8.6 mg/dL (ref 8.4–10.5)
Creatinine, Ser: 11.22 mg/dL — ABNORMAL HIGH (ref 0.4–1.5)
Glucose, Bld: 105 mg/dL — ABNORMAL HIGH (ref 70–99)

## 2011-01-07 ENCOUNTER — Inpatient Hospital Stay (HOSPITAL_COMMUNITY): Payer: Medicare Other

## 2011-01-07 LAB — HEPATIC FUNCTION PANEL
AST: 12 U/L (ref 0–37)
Albumin: 2.2 g/dL — ABNORMAL LOW (ref 3.5–5.2)

## 2011-01-07 LAB — RENAL FUNCTION PANEL
CO2: 22 mEq/L (ref 19–32)
Calcium: 8.8 mg/dL (ref 8.4–10.5)
Creatinine, Ser: 13.58 mg/dL — ABNORMAL HIGH (ref 0.4–1.5)
GFR calc Af Amer: 5 mL/min — ABNORMAL LOW (ref >60)
GFR calc non Af Amer: 4 mL/min — ABNORMAL LOW (ref >60)

## 2011-01-07 LAB — CBC
HCT: 29.8 % — ABNORMAL LOW (ref 39.0–52.0)
HCT: 27.7 % — ABNORMAL LOW (ref 39.0–52.0)
Hemoglobin: 9.2 g/dL — ABNORMAL LOW (ref 13.0–17.0)
MCH: 27.8 pg (ref 26.0–34.0)
MCHC: 31 g/dL (ref 30.0–36.0)
Platelets: 268 10*3/uL (ref 150–400)
RBC: 3.31 MIL/uL — ABNORMAL LOW (ref 4.22–5.81)
RDW: 16.9 % — ABNORMAL HIGH (ref 11.5–15.5)

## 2011-01-07 LAB — GLUCOSE, CAPILLARY
Glucose-Capillary: 104 mg/dL — ABNORMAL HIGH (ref 70–99)
Glucose-Capillary: 76 mg/dL (ref 70–99)
Glucose-Capillary: 76 mg/dL (ref 70–99)
Glucose-Capillary: 78 mg/dL (ref 70–99)
Glucose-Capillary: 83 mg/dL (ref 70–99)
Glucose-Capillary: 94 mg/dL (ref 70–99)

## 2011-01-07 LAB — BASIC METABOLIC PANEL
GFR calc non Af Amer: 4 mL/min — ABNORMAL LOW (ref >60)
Potassium: 4.7 mEq/L (ref 3.5–5.1)
Sodium: 131 mEq/L — ABNORMAL LOW (ref 135–145)

## 2011-01-07 LAB — PROTIME-INR
INR: 3.72 — ABNORMAL HIGH (ref 0.00–1.49)
Prothrombin Time: 36.8 seconds — ABNORMAL HIGH (ref 11.6–15.2)

## 2011-01-07 LAB — CORTISOL: Cortisol, Plasma: 11.2 ug/dL

## 2011-01-07 MED ORDER — IOHEXOL 300 MG/ML  SOLN
50.0000 mL | Freq: Once | INTRAMUSCULAR | Status: AC | PRN
  Administered 2011-01-07: 20 mL via INTRAVENOUS

## 2011-01-08 ENCOUNTER — Inpatient Hospital Stay (HOSPITAL_COMMUNITY): Payer: Medicare Other

## 2011-01-08 DIAGNOSIS — I059 Rheumatic mitral valve disease, unspecified: Secondary | ICD-10-CM

## 2011-01-08 DIAGNOSIS — A419 Sepsis, unspecified organism: Secondary | ICD-10-CM

## 2011-01-08 DIAGNOSIS — R6520 Severe sepsis without septic shock: Secondary | ICD-10-CM

## 2011-01-08 LAB — CBC
HCT: 29.2 % — ABNORMAL LOW (ref 39.0–52.0)
Hemoglobin: 8.9 g/dL — ABNORMAL LOW (ref 13.0–17.0)
MCH: 28 pg (ref 26.0–34.0)
MCHC: 30.5 g/dL (ref 30.0–36.0)

## 2011-01-08 LAB — RENAL FUNCTION PANEL
BUN: 15 mg/dL (ref 6–23)
CO2: 28 mEq/L (ref 19–32)
Chloride: 100 mEq/L (ref 96–112)
Glucose, Bld: 102 mg/dL — ABNORMAL HIGH (ref 70–99)
Potassium: 3.7 mEq/L (ref 3.5–5.1)

## 2011-01-08 LAB — PROTIME-INR
INR: 1.05 (ref 0.00–1.49)
INR: 1.1 (ref 0.00–1.49)
Prothrombin Time: 21.5 seconds — ABNORMAL HIGH (ref 11.6–15.2)

## 2011-01-08 LAB — POCT I-STAT 4, (NA,K, GLUC, HGB,HCT)
HCT: 44 % (ref 39.0–52.0)
Sodium: 138 mEq/L (ref 135–145)

## 2011-01-08 LAB — APTT: aPTT: 30 seconds (ref 24–37)

## 2011-01-08 LAB — CATH TIP CULTURE

## 2011-01-08 LAB — POCT I-STAT, CHEM 8
Glucose, Bld: 102 mg/dL — ABNORMAL HIGH (ref 70–99)
HCT: 40 % (ref 39.0–52.0)
Hemoglobin: 13.6 g/dL (ref 13.0–17.0)
Potassium: 4 mEq/L (ref 3.5–5.1)
Sodium: 135 mEq/L (ref 135–145)

## 2011-01-08 LAB — GLUCOSE, CAPILLARY

## 2011-01-08 MED ORDER — IOHEXOL 300 MG/ML  SOLN
50.0000 mL | Freq: Once | INTRAMUSCULAR | Status: AC | PRN
  Administered 2011-01-08: 40 mL via INTRAVENOUS

## 2011-01-09 ENCOUNTER — Inpatient Hospital Stay (HOSPITAL_COMMUNITY): Payer: Medicare Other

## 2011-01-09 LAB — COMPREHENSIVE METABOLIC PANEL
ALT: 13 U/L (ref 0–53)
AST: 13 U/L (ref 0–37)
Albumin: 2.2 g/dL — ABNORMAL LOW (ref 3.5–5.2)
Alkaline Phosphatase: 269 U/L — ABNORMAL HIGH (ref 39–117)
GFR calc Af Amer: 8 mL/min — ABNORMAL LOW (ref >60)
Glucose, Bld: 109 mg/dL — ABNORMAL HIGH (ref 70–99)
Potassium: 3.9 mEq/L (ref 3.5–5.1)
Sodium: 135 mEq/L (ref 135–145)
Total Protein: 7.3 g/dL (ref 6.0–8.3)

## 2011-01-09 LAB — PROTIME-INR
INR: 2.18 — ABNORMAL HIGH (ref 0.00–1.49)
Prothrombin Time: 24.4 seconds — ABNORMAL HIGH (ref 11.6–15.2)

## 2011-01-10 LAB — HEPATIC FUNCTION PANEL
ALT: 10 U/L (ref 0–53)
AST: 11 U/L (ref 0–37)
Albumin: 2.3 g/dL — ABNORMAL LOW (ref 3.5–5.2)
Bilirubin, Direct: <0.1 mg/dL (ref 0.0–0.3)
Total Bilirubin: 0.8 mg/dL (ref 0.3–1.2)

## 2011-01-10 LAB — RENAL FUNCTION PANEL
BUN: 16 mg/dL (ref 6–23)
CO2: 24 mEq/L (ref 19–32)
Calcium: 8.9 mg/dL (ref 8.4–10.5)
Glucose, Bld: 106 mg/dL — ABNORMAL HIGH (ref 70–99)
Phosphorus: 5.1 mg/dL — ABNORMAL HIGH (ref 2.3–4.6)

## 2011-01-11 ENCOUNTER — Inpatient Hospital Stay (HOSPITAL_COMMUNITY): Payer: Medicare Other

## 2011-01-11 LAB — BASIC METABOLIC PANEL
BUN: 32 mg/dL — ABNORMAL HIGH (ref 6–23)
BUN: 30 mg/dL — ABNORMAL HIGH (ref 6–23)
BUN: 34 mg/dL — ABNORMAL HIGH (ref 6–23)
CO2: 25 mEq/L (ref 19–32)
Calcium: 8.9 mg/dL (ref 8.4–10.5)
Calcium: 9.2 mg/dL (ref 8.4–10.5)
Calcium: 9.7 mg/dL (ref 8.4–10.5)
Chloride: 100 mEq/L (ref 96–112)
Chloride: 101 mEq/L (ref 96–112)
Creatinine, Ser: 6.86 mg/dL — ABNORMAL HIGH (ref 0.4–1.5)
GFR calc Af Amer: 11 mL/min — ABNORMAL LOW (ref >60)
GFR calc Af Amer: 9 mL/min — ABNORMAL LOW (ref >60)
GFR calc non Af Amer: 7 mL/min — ABNORMAL LOW (ref >60)
GFR calc non Af Amer: 11 mL/min — ABNORMAL LOW (ref >60)
Glucose, Bld: 120 mg/dL — ABNORMAL HIGH (ref 70–99)
Glucose, Bld: 96 mg/dL (ref 70–99)
Potassium: 5.3 mEq/L — ABNORMAL HIGH (ref 3.5–5.1)
Sodium: 133 mEq/L — ABNORMAL LOW (ref 135–145)
Sodium: 138 mEq/L (ref 135–145)

## 2011-01-11 LAB — PROTIME-INR
INR: 1.93 — ABNORMAL HIGH (ref 0.00–1.49)
INR: 1.2 (ref 0.00–1.49)
INR: 1.31 (ref 0.00–1.49)
INR: 1.44 (ref 0.00–1.49)
INR: 1.67 — ABNORMAL HIGH (ref 0.00–1.49)
INR: 2.99 — ABNORMAL HIGH (ref 0.00–1.49)
INR: 3.22 — ABNORMAL HIGH (ref 0.00–1.49)
Prothrombin Time: 15.1 seconds (ref 11.6–15.2)
Prothrombin Time: 30.8 seconds — ABNORMAL HIGH (ref 11.6–15.2)
Prothrombin Time: 32.7 seconds — ABNORMAL HIGH (ref 11.6–15.2)

## 2011-01-11 LAB — RENAL FUNCTION PANEL
Albumin: 2.5 g/dL — ABNORMAL LOW (ref 3.5–5.2)
CO2: 23 mEq/L (ref 19–32)
CO2: 26 mEq/L (ref 19–32)
Calcium: 9.4 mg/dL (ref 8.4–10.5)
Chloride: 101 mEq/L (ref 96–112)
GFR calc Af Amer: 7 mL/min — ABNORMAL LOW (ref >60)
GFR calc Af Amer: 8 mL/min — ABNORMAL LOW (ref >60)
GFR calc non Af Amer: 5 mL/min — ABNORMAL LOW (ref >60)
GFR calc non Af Amer: 7 mL/min — ABNORMAL LOW (ref >60)
Phosphorus: 9 mg/dL — ABNORMAL HIGH (ref 2.3–4.6)
Potassium: 4.7 mEq/L (ref 3.5–5.1)
Sodium: 134 mEq/L — ABNORMAL LOW (ref 135–145)
Sodium: 138 mEq/L (ref 135–145)

## 2011-01-11 LAB — CBC
HCT: 31.7 % — ABNORMAL LOW (ref 39.0–52.0)
HCT: 26.1 % — ABNORMAL LOW (ref 39.0–52.0)
HCT: 26.7 % — ABNORMAL LOW (ref 39.0–52.0)
Hemoglobin: 9.8 g/dL — ABNORMAL LOW (ref 13.0–17.0)
Hemoglobin: 10.4 g/dL — ABNORMAL LOW (ref 13.0–17.0)
Hemoglobin: 10.7 g/dL — ABNORMAL LOW (ref 13.0–17.0)
Hemoglobin: 8.9 g/dL — ABNORMAL LOW (ref 13.0–17.0)
Hemoglobin: 9.8 g/dL — ABNORMAL LOW (ref 13.0–17.0)
MCH: 30.9 pg (ref 26.0–34.0)
MCH: 30.9 pg (ref 26.0–34.0)
MCHC: 33.2 g/dL (ref 30.0–36.0)
MCHC: 33.2 g/dL (ref 30.0–36.0)
MCHC: 33.5 g/dL (ref 30.0–36.0)
MCHC: 33.6 g/dL (ref 30.0–36.0)
MCV: 91.1 fL (ref 78.0–100.0)
MCV: 90.6 fL (ref 78.0–100.0)
Platelets: 221 10*3/uL (ref 150–400)
Platelets: 226 10*3/uL (ref 150–400)
RBC: 2.92 MIL/uL — ABNORMAL LOW (ref 4.22–5.81)
RBC: 2.99 MIL/uL — ABNORMAL LOW (ref 4.22–5.81)
RBC: 3.19 MIL/uL — ABNORMAL LOW (ref 4.22–5.81)
RBC: 3.46 MIL/uL — ABNORMAL LOW (ref 4.22–5.81)
RDW: 17.1 % — ABNORMAL HIGH (ref 11.5–15.5)
RDW: 16.6 % — ABNORMAL HIGH (ref 11.5–15.5)
RDW: 16.7 % — ABNORMAL HIGH (ref 11.5–15.5)
RDW: 16.8 % — ABNORMAL HIGH (ref 11.5–15.5)
RDW: 17 % — ABNORMAL HIGH (ref 11.5–15.5)
WBC: 8.4 10*3/uL (ref 4.0–10.5)
WBC: 10.1 10*3/uL (ref 4.0–10.5)
WBC: 10.9 10*3/uL — ABNORMAL HIGH (ref 4.0–10.5)
WBC: 12.6 10*3/uL — ABNORMAL HIGH (ref 4.0–10.5)
WBC: 7.9 10*3/uL (ref 4.0–10.5)

## 2011-01-11 LAB — GLUCOSE, CAPILLARY
Glucose-Capillary: 101 mg/dL — ABNORMAL HIGH (ref 70–99)
Glucose-Capillary: 103 mg/dL — ABNORMAL HIGH (ref 70–99)
Glucose-Capillary: 103 mg/dL — ABNORMAL HIGH (ref 70–99)
Glucose-Capillary: 110 mg/dL — ABNORMAL HIGH (ref 70–99)
Glucose-Capillary: 117 mg/dL — ABNORMAL HIGH (ref 70–99)
Glucose-Capillary: 120 mg/dL — ABNORMAL HIGH (ref 70–99)
Glucose-Capillary: 128 mg/dL — ABNORMAL HIGH (ref 70–99)
Glucose-Capillary: 99 mg/dL (ref 70–99)

## 2011-01-11 LAB — IRON AND TIBC: Saturation Ratios: 11 % — ABNORMAL LOW (ref 20–55)

## 2011-01-11 LAB — HEPARIN LEVEL (UNFRACTIONATED)
Heparin Unfractionated: 0.23 IU/mL — ABNORMAL LOW (ref 0.30–0.70)
Heparin Unfractionated: 0.37 IU/mL (ref 0.30–0.70)
Heparin Unfractionated: 0.56 IU/mL (ref 0.30–0.70)
Heparin Unfractionated: >2 IU/mL — ABNORMAL HIGH (ref 0.30–0.70)

## 2011-01-11 LAB — CULTURE, BLOOD (ROUTINE X 2)
Culture  Setup Time: 201203112355
Culture: NO GROWTH

## 2011-01-11 LAB — MAGNESIUM: Magnesium: 2.8 mg/dL — ABNORMAL HIGH (ref 1.5–2.5)

## 2011-01-12 LAB — CBC
HCT: 30.5 % — ABNORMAL LOW (ref 39.0–52.0)
HCT: 30.9 % — ABNORMAL LOW (ref 39.0–52.0)
Hemoglobin: 10.6 g/dL — ABNORMAL LOW (ref 13.0–17.0)
Hemoglobin: 10.8 g/dL — ABNORMAL LOW (ref 13.0–17.0)
MCH: 27.7 pg (ref 26.0–34.0)
MCH: 30.5 pg (ref 26.0–34.0)
MCH: 31.1 pg (ref 26.0–34.0)
MCH: 31.1 pg (ref 26.0–34.0)
MCHC: 30.5 g/dL (ref 30.0–36.0)
MCHC: 33.2 g/dL (ref 30.0–36.0)
MCHC: 33.3 g/dL (ref 30.0–36.0)
MCHC: 34.3 g/dL (ref 30.0–36.0)
MCV: 91 fL (ref 78.0–100.0)
MCV: 90.5 fL (ref 78.0–100.0)
MCV: 91.6 fL (ref 78.0–100.0)
MCV: 91.8 fL (ref 78.0–100.0)
Platelets: 373 10*3/uL (ref 150–400)
Platelets: 219 10*3/uL (ref 150–400)
Platelets: 227 10*3/uL (ref 150–400)
RBC: 3.46 MIL/uL — ABNORMAL LOW (ref 4.22–5.81)
RBC: 3.54 MIL/uL — ABNORMAL LOW (ref 4.22–5.81)
RBC: 3.6 MIL/uL — ABNORMAL LOW (ref 4.22–5.81)
RDW: 16.7 % — ABNORMAL HIGH (ref 11.5–15.5)
RDW: 16.9 % — ABNORMAL HIGH (ref 11.5–15.5)
RDW: 17.2 % — ABNORMAL HIGH (ref 11.5–15.5)
WBC: 5.9 10*3/uL (ref 4.0–10.5)

## 2011-01-12 LAB — MAGNESIUM
Magnesium: 2.6 mg/dL — ABNORMAL HIGH (ref 1.5–2.5)
Magnesium: 2 mg/dL (ref 1.5–2.5)
Magnesium: 2.5 mg/dL (ref 1.5–2.5)
Magnesium: 2.6 mg/dL — ABNORMAL HIGH (ref 1.5–2.5)

## 2011-01-12 LAB — PROTIME-INR
INR: 1.6 — ABNORMAL HIGH (ref 0.00–1.49)
INR: 3.11 — ABNORMAL HIGH (ref 0.00–1.49)
INR: 3.93 — ABNORMAL HIGH (ref 0.00–1.49)
INR: 4.2 — ABNORMAL HIGH (ref 0.00–1.49)
Prothrombin Time: 19.2 seconds — ABNORMAL HIGH (ref 11.6–15.2)

## 2011-01-12 LAB — CARDIAC PANEL(CRET KIN+CKTOT+MB+TROPI)
CK, MB: 1.8 ng/mL (ref 0.3–4.0)
Relative Index: INVALID (ref 0.0–2.5)
Total CK: 23 U/L (ref 7–232)
Total CK: 24 U/L (ref 7–232)
Troponin I: 0.01 ng/mL (ref 0.00–0.06)
Troponin I: 0.02 ng/mL (ref 0.00–0.06)

## 2011-01-12 LAB — BASIC METABOLIC PANEL
BUN: 30 mg/dL — ABNORMAL HIGH (ref 6–23)
CO2: 27 mEq/L (ref 19–32)
CO2: 29 mEq/L (ref 19–32)
Calcium: 9.1 mg/dL (ref 8.4–10.5)
Chloride: 87 mEq/L — ABNORMAL LOW (ref 96–112)
Chloride: 91 mEq/L — ABNORMAL LOW (ref 96–112)
Creatinine, Ser: 6.94 mg/dL — ABNORMAL HIGH (ref 0.4–1.5)
Creatinine, Ser: 6.5 mg/dL — ABNORMAL HIGH (ref 0.4–1.5)
GFR calc Af Amer: 11 mL/min — ABNORMAL LOW (ref >60)
GFR calc Af Amer: 7 mL/min — ABNORMAL LOW (ref >60)
GFR calc non Af Amer: 9 mL/min — ABNORMAL LOW (ref >60)
Glucose, Bld: 109 mg/dL — ABNORMAL HIGH (ref 70–99)
Glucose, Bld: 104 mg/dL — ABNORMAL HIGH (ref 70–99)
Potassium: 4.1 mEq/L (ref 3.5–5.1)
Sodium: 128 mEq/L — ABNORMAL LOW (ref 135–145)

## 2011-01-12 LAB — COMPREHENSIVE METABOLIC PANEL
ALT: <8 U/L (ref 0–53)
Alkaline Phosphatase: 102 U/L (ref 39–117)
CO2: 27 mEq/L (ref 19–32)
Chloride: 93 mEq/L — ABNORMAL LOW (ref 96–112)
GFR calc non Af Amer: 7 mL/min — ABNORMAL LOW (ref >60)
Glucose, Bld: 132 mg/dL — ABNORMAL HIGH (ref 70–99)
Potassium: 4.9 mEq/L (ref 3.5–5.1)
Sodium: 135 mEq/L (ref 135–145)
Total Bilirubin: 1.1 mg/dL (ref 0.3–1.2)

## 2011-01-12 LAB — RENAL FUNCTION PANEL
BUN: 47 mg/dL — ABNORMAL HIGH (ref 6–23)
CO2: 27 mEq/L (ref 19–32)
Calcium: 8.6 mg/dL (ref 8.4–10.5)
Creatinine, Ser: 10.84 mg/dL — ABNORMAL HIGH (ref 0.4–1.5)
Glucose, Bld: 79 mg/dL (ref 70–99)
Phosphorus: 7.4 mg/dL — ABNORMAL HIGH (ref 2.3–4.6)
Sodium: 126 mEq/L — ABNORMAL LOW (ref 135–145)

## 2011-01-12 LAB — GLUCOSE, CAPILLARY
Glucose-Capillary: 109 mg/dL — ABNORMAL HIGH (ref 70–99)
Glucose-Capillary: 109 mg/dL — ABNORMAL HIGH (ref 70–99)
Glucose-Capillary: 121 mg/dL — ABNORMAL HIGH (ref 70–99)
Glucose-Capillary: 126 mg/dL — ABNORMAL HIGH (ref 70–99)
Glucose-Capillary: 128 mg/dL — ABNORMAL HIGH (ref 70–99)
Glucose-Capillary: 68 mg/dL — ABNORMAL LOW (ref 70–99)
Glucose-Capillary: 77 mg/dL (ref 70–99)
Glucose-Capillary: 80 mg/dL (ref 70–99)
Glucose-Capillary: 81 mg/dL (ref 70–99)
Glucose-Capillary: 84 mg/dL (ref 70–99)
Glucose-Capillary: 85 mg/dL (ref 70–99)
Glucose-Capillary: 90 mg/dL (ref 70–99)
Glucose-Capillary: 98 mg/dL (ref 70–99)

## 2011-01-12 LAB — CULTURE, BLOOD (ROUTINE X 2): Culture: NO GROWTH

## 2011-01-12 LAB — VANCOMYCIN, RANDOM
Vancomycin Rm: 25.5 ug/mL
Vancomycin Rm: 43.5 ug/mL

## 2011-01-12 LAB — LACTIC ACID, PLASMA: Lactic Acid, Venous: 1.4 mmol/L (ref 0.5–2.2)

## 2011-01-12 LAB — ALKALINE PHOSPHATASE: Alkaline Phosphatase: 169 U/L — ABNORMAL HIGH (ref 39–117)

## 2011-01-12 LAB — CORTISOL: Cortisol, Plasma: 27.8 ug/dL

## 2011-01-12 LAB — PREPARE FRESH FROZEN PLASMA

## 2011-01-12 LAB — MRSA PCR SCREENING: MRSA by PCR: NEGATIVE

## 2011-01-12 LAB — PHOSPHORUS: Phosphorus: 9.1 mg/dL (ref 2.3–4.6)

## 2011-01-13 LAB — MAGNESIUM: Magnesium: 2.6 mg/dL — ABNORMAL HIGH (ref 1.5–2.5)

## 2011-01-13 LAB — BASIC METABOLIC PANEL
Calcium: 8.8 mg/dL (ref 8.4–10.5)
Creatinine, Ser: 8.96 mg/dL — ABNORMAL HIGH (ref 0.4–1.5)
GFR calc Af Amer: 8 mL/min — ABNORMAL LOW (ref >60)

## 2011-01-13 LAB — CULTURE, BLOOD (ROUTINE X 2)
Culture: NO GROWTH
Culture: NO GROWTH

## 2011-01-13 LAB — CBC
MCH: 27.9 pg (ref 26.0–34.0)
MCHC: 30.2 g/dL (ref 30.0–36.0)
Platelets: 444 10*3/uL — ABNORMAL HIGH (ref 150–400)
RBC: 3.4 MIL/uL — ABNORMAL LOW (ref 4.22–5.81)
RDW: 17.8 % — ABNORMAL HIGH (ref 11.5–15.5)

## 2011-01-13 LAB — PHOSPHORUS: Phosphorus: 4.7 mg/dL — ABNORMAL HIGH (ref 2.3–4.6)

## 2011-01-14 ENCOUNTER — Inpatient Hospital Stay (HOSPITAL_COMMUNITY): Payer: Self-pay

## 2011-01-14 LAB — RENAL FUNCTION PANEL
Albumin: 2.8 g/dL — ABNORMAL LOW (ref 3.5–5.2)
Albumin: 3.1 g/dL — ABNORMAL LOW (ref 3.5–5.2)
BUN: 75 mg/dL — ABNORMAL HIGH (ref 6–23)
CO2: 18 mEq/L — ABNORMAL LOW (ref 19–32)
CO2: 22 mEq/L (ref 19–32)
Calcium: 8.9 mg/dL (ref 8.4–10.5)
Calcium: 8.5 mg/dL (ref 8.4–10.5)
Calcium: 8.8 mg/dL (ref 8.4–10.5)
Chloride: 96 mEq/L (ref 96–112)
Creatinine, Ser: 10.93 mg/dL — ABNORMAL HIGH (ref 0.4–1.5)
GFR calc Af Amer: 6 mL/min — ABNORMAL LOW (ref >60)
GFR calc Af Amer: 9 mL/min — ABNORMAL LOW (ref >60)
GFR calc non Af Amer: 5 mL/min — ABNORMAL LOW (ref >60)
GFR calc non Af Amer: 7 mL/min — ABNORMAL LOW (ref >60)
Glucose, Bld: 108 mg/dL — ABNORMAL HIGH (ref 70–99)
Glucose, Bld: 82 mg/dL (ref 70–99)
Phosphorus: 5.3 mg/dL — ABNORMAL HIGH (ref 2.3–4.6)
Potassium: 3.5 mEq/L (ref 3.5–5.1)
Sodium: 135 mEq/L (ref 135–145)
Sodium: 137 mEq/L (ref 135–145)

## 2011-01-14 LAB — CBC
HCT: 30.7 % — ABNORMAL LOW (ref 39.0–52.0)
Hemoglobin: 10.5 g/dL — ABNORMAL LOW (ref 13.0–17.0)
MCH: 28.5 pg (ref 26.0–34.0)
MCHC: 30.7 g/dL (ref 30.0–36.0)
MCV: 90 fL (ref 78.0–100.0)
Platelets: 479 10*3/uL — ABNORMAL HIGH (ref 150–400)
Platelets: 196 10*3/uL (ref 150–400)
WBC: 7.5 10*3/uL (ref 4.0–10.5)

## 2011-01-14 LAB — GLUCOSE, CAPILLARY: Glucose-Capillary: 82 mg/dL (ref 70–99)

## 2011-01-14 LAB — PROTIME-INR
INR: 2.62 — ABNORMAL HIGH (ref 0.00–1.49)
Prothrombin Time: 27.8 seconds — ABNORMAL HIGH (ref 11.6–15.2)

## 2011-01-14 NOTE — Consult Note (Signed)
NAME:  EYTAN, CARRIGAN NO.:  [id number]  MEDICAL RECORD NO.:  [id number]           PATIENT TYPE:  LOCATION:                                 FACILITY:  PHYSICIAN:  Fransisco Hertz, MD       DATE OF BIRTH:  Jul 21, 1966  DATE OF CONSULTATION: DATE OF DISCHARGE:                                CONSULTATION   This consultation is from the Pulmonary Critical Care Team.  REASON FOR CONSULTATION:  Rule out left arteriovenous graft infection.  HISTORY OF PRESENT ILLNESS:  This is a 45 year old gentleman well known to our Vascular Service from multiple access procedures that I recently placed a left thigh arteriovenous graft on January 01, 2011.  The patient's postoperative course from that procedure was unremarkable and he was discharged today after his graft was placed after undergoing hemodialysis via his right graft.  At that point of his discharge, he did not have any wound issues and had a normal white count.  Apparently since then about Saturday, he started having some drainage from the left groin.  He denies any subjective fevers, or chills.  Apparently, he was seen at [facility] with generalized weakness.  He [place] was febrile up to a temperature 103.5 F and was tachycardic and hypotensive. He was subsequently transferred to [facility] for management of his presumed sepsis.  MEDICAL HISTORY: 1. Hypertension. 2. Coronary artery disease. 3. Previous MI. 4. Hyperlipidemia. 5. Legally blind. 6. Nephrogenic systemic fibrosis. 7. Atrial fibrillation. 8. Congestive heart failure. 9. History of partial small-bowel obstruction. 10.Antiphospholipid syndrome. 11.End-stage renal disease.  PAST SURGICAL HISTORY:  Multiple on the upper extremity fistulas and grafts, also a right thigh graft and now a left thigh graft.  Had coronary artery stenting and a prior history of left nephrectomy for unknown reasons.  Most recently also had a tunneled dialysis  catheter exchanged on December 04, 2010, in the right groin.  SOCIAL HISTORY:  No tobacco, alcohol, or illicit drug use.  FAMILY HISTORY:  He does not know if his mother or father had any medical problems.  MEDICATIONS:  Zocor, Renedil, Patanol, Nephro-Vite, midodrine, Ativan, hydralazine, Pepcid, Coumadin, Atarax, aspirin, and Ambien.  ALLERGIES:  He had no known drug allergies.  REVIEW OF SYSTEMS:  As noted above, otherwise noted to be negative.  PHYSICAL EXAMINATION:  VITAL SIGNS:  He came in with a temperature of 97.9 with a blood pressure of 88/45, heart rate of 95, respirations were 19 with 100% saturation on room air.  Note this blood pressure is with norepinephrine at 15 mcg per minute. GENERAL:  Melton Krebs is blind, in no apparent distress.  He is alert and oriented x3. HEAD:  Normocephalic and atraumatic. ENT:  His oropharynx without any erythema or exudate.  His nares without any drainage or erythema.  His hearing is grossly intact. NECK:  Supple neck without any nuchal rigidity. EYE:  He had minimal pupillary reaction bilaterally.  He is able to movehis eyes to command demonstrating intact extraocular movements. PULMONARY:  Symmetric expansion.  Good air movement.  No rales, rhonchi, or wheezing. CARDIAC:  Regular rate and  rhythm.  Normal S1 and S2.  No murmurs, rubs, thrills, or gallops. VASCULAR:  He had palpable radial and brachial pulses and carotids without any bruits.  I did not note any pulsatile midline mass.  He also had a weakly palpable femoral.  I could not feel the left as this is postsurgical and he wound not tolerate a significant exam, however, he did have palpable DP on the left side and no palpable pulse in the pedal positions on the right side.  There was no popliteal on either side. GI:  He had a soft abdomen, nontender, nondistended.  No guarding.  No rebound.  No hepatosplenomegaly. MUSCULOSKELETAL:  He had 5/5 hand grip today.  I was able to move  his upper extremities to instruction with some mild contractures on both arms.  Multiple grafts in the upper extremities and both to his lower legs he has contractures which limit his motor testing, but he does have good 5/5 plantarflexion and dorsiflexion and on the left groin at the incision site there is a superficial wound separation at the femoral incision with serous fluid draining, but no frank purulence on the dressings coming out of wound at this point.  Medial and lateral thigh incisions are intact with Dermabond in place. NEURO:  Cranial nerves II-XII were intact except for him being blind. He is able to follow instructions and follow the testing.  His sensation was grossly intact in extremities and motor was as listed above. PSYCH:  His judgment was intact.  His mood and affect were appropriate for his clinical situation. SKIN:  I did not appreciate any rashes elsewhere. LYMPHATIC:  He did not have any cervical, axillary, or inguinal lymphadenopathy that was evident.  LABORATORY STUDIES:  He had a set of blood cultures from an outside facility which today were negative.  He had a CBC with white count of 7.0, H and H 9.2 and 28.6, and platelet count of 188.  He had an outside comprehensive metabolic with glucose 76, BUN 26, creatinine 9.1, sodium was 136, potassium 4.0, chloride 99, bicarb 22, total calcium 8.7, total bilirubin 0.5, alk phos was 200, ALT was 12, AST was 19, total protein was 7.8, serum albumin was 2.7.  He also had a troponin which was slightly elevated at 0.09.  Phosphorus was at 2.0.  INR was 1.9 with PT of 21.3.  He had an outside chest x-ray which was read as possible low- grade congestive heart failure and atelectasis.  He also had a CAT scan of his head which demonstrated possible sinusitis, but no other acute abnormalities in his brain.  MEDICAL DECISION MAKING:  This is a 45 year old gentleman who is presenting with a presentation consistent with  septic shock.  At this point, it is not clear to me the exact etiology of this patient's septic shock.  Usually, it requires more days than postop day #4 for a significant infection to develop.  There is serous fluid draining from his left groin at this point and no frank purulence.  Also, on examination of the thigh graft externally, he has appropriate tenderness, but he does not have true pain to light touch, which would be more consistent with a frankly infected left thigh graft.  His right tunnel dialysis catheter was also looked at, the exit site does not look infected.  There was no tenderness at the site.  It previously was exchanged due to concern with possible infection of the previous tunneled dialysis catheter.  Given that this  patient has limited access, this was done as exchange of catheter and so inherently this catheter is under some degree of suspicion of possibly being infected.  Given the long term indwelling nature of this right femoral tunnel dialysis catheter, also consideration of possible cardiac seeding should be considered.  Based on the current findings, I think he is going to need wide spectrum antibiotics, supportive care with pressor support while his sepsis is resolving.  This left thigh graft is unfortunately this gentleman's last possible permanent access, so every attempt to try to salvage tissue occurred in terms of his left groin.  I would just continue with local wound care at this point placing sterile bandages at this site every 6 hours and also paint this wound with Betadine to try to keep it as sterile as possible.  Once his pressure stabilizes, I think the only way to definitively determine whether or not this graft is at all infected would be to take him back to the operating room in a sterile condition, re-prep him and then do a limited interrogation of the left groin wound.  If it demonstrates the graft within the wound, then this is  presumptively infected.  At that point, I would take out this graft, tie off his saphenous vein and then patch his artery, and then I would debride any devitalized tissue, wash it out, and place wound VAC in his left groin, but at this point to my suspicion that this is infected graft is low.  We will continue to follow up along in this patient's care.     Fransisco Hertz, MD     BLC/MEDQ  D:  01/05/2011  T:  01/06/2011  Job:  [id number]  Electronically Signed by Leonides Sake MD on 01/14/2011 10:58:49 AM

## 2011-01-15 DIAGNOSIS — R6521 Severe sepsis with septic shock: Secondary | ICD-10-CM

## 2011-01-15 DIAGNOSIS — R6520 Severe sepsis without septic shock: Secondary | ICD-10-CM

## 2011-01-15 DIAGNOSIS — A419 Sepsis, unspecified organism: Secondary | ICD-10-CM

## 2011-01-15 LAB — BASIC METABOLIC PANEL
BUN: 24 mg/dL — ABNORMAL HIGH (ref 6–23)
BUN: 39 mg/dL — ABNORMAL HIGH (ref 6–23)
BUN: 43 mg/dL — ABNORMAL HIGH (ref 6–23)
BUN: 48 mg/dL — ABNORMAL HIGH (ref 6–23)
CO2: 24 mEq/L (ref 19–32)
CO2: 23 mEq/L (ref 19–32)
Calcium: 8.9 mg/dL (ref 8.4–10.5)
Calcium: 9.6 mg/dL (ref 8.4–10.5)
Chloride: 103 mEq/L (ref 96–112)
Chloride: 95 mEq/L — ABNORMAL LOW (ref 96–112)
Chloride: 97 mEq/L (ref 96–112)
Chloride: 97 mEq/L (ref 96–112)
Creatinine, Ser: 10.28 mg/dL — ABNORMAL HIGH (ref 0.4–1.5)
Creatinine, Ser: 6.23 mg/dL — ABNORMAL HIGH (ref 0.4–1.5)
Creatinine, Ser: 8.87 mg/dL — ABNORMAL HIGH (ref 0.4–1.5)
Creatinine, Ser: 9.47 mg/dL — ABNORMAL HIGH (ref 0.4–1.5)
GFR calc Af Amer: 8 mL/min — ABNORMAL LOW (ref >60)
GFR calc Af Amer: 8 mL/min — ABNORMAL LOW (ref >60)
GFR calc non Af Amer: 10 mL/min — ABNORMAL LOW (ref >60)
GFR calc non Af Amer: 6 mL/min — ABNORMAL LOW (ref >60)
GFR calc non Af Amer: 6 mL/min — ABNORMAL LOW (ref >60)
GFR calc non Af Amer: 7 mL/min — ABNORMAL LOW (ref >60)
Glucose, Bld: 113 mg/dL — ABNORMAL HIGH (ref 70–99)
Glucose, Bld: 182 mg/dL — ABNORMAL HIGH (ref 70–99)
Glucose, Bld: 93 mg/dL (ref 70–99)
Potassium: 4.3 mEq/L (ref 3.5–5.1)
Potassium: 3.8 mEq/L (ref 3.5–5.1)
Potassium: 4.2 mEq/L (ref 3.5–5.1)
Potassium: 4.6 mEq/L (ref 3.5–5.1)
Potassium: 5.3 mEq/L — ABNORMAL HIGH (ref 3.5–5.1)
Potassium: 6.2 mEq/L — ABNORMAL HIGH (ref 3.5–5.1)
Sodium: 139 mEq/L (ref 135–145)
Sodium: 135 mEq/L (ref 135–145)
Sodium: 138 mEq/L (ref 135–145)

## 2011-01-15 LAB — MAGNESIUM
Magnesium: 2.6 mg/dL — ABNORMAL HIGH (ref 1.5–2.5)
Magnesium: 2.3 mg/dL (ref 1.5–2.5)
Magnesium: 2.6 mg/dL — ABNORMAL HIGH (ref 1.5–2.5)
Magnesium: 2.7 mg/dL — ABNORMAL HIGH (ref 1.5–2.5)

## 2011-01-15 LAB — PROTIME-INR
INR: 2.44 — ABNORMAL HIGH (ref 0.00–1.49)
INR: 1.44 (ref 0.00–1.49)
INR: 1.57 — ABNORMAL HIGH (ref 0.00–1.49)
INR: 1.74 — ABNORMAL HIGH (ref 0.00–1.49)
INR: 2.11 — ABNORMAL HIGH (ref 0.00–1.49)
Prothrombin Time: 26.3 seconds — ABNORMAL HIGH (ref 11.6–15.2)
Prothrombin Time: 17.2 seconds — ABNORMAL HIGH (ref 11.6–15.2)
Prothrombin Time: 17.4 seconds — ABNORMAL HIGH (ref 11.6–15.2)
Prothrombin Time: 18.6 seconds — ABNORMAL HIGH (ref 11.6–15.2)

## 2011-01-15 LAB — POCT I-STAT, CHEM 8
BUN: 20 mg/dL (ref 6–23)
Calcium, Ion: 1.04 mmol/L — ABNORMAL LOW (ref 1.12–1.32)
Chloride: 98 mEq/L (ref 96–112)
HCT: 34 % — ABNORMAL LOW (ref 39.0–52.0)
Potassium: 4.1 mEq/L (ref 3.5–5.1)
Sodium: 139 mEq/L (ref 135–145)

## 2011-01-15 LAB — CBC
HCT: 30.3 % — ABNORMAL LOW (ref 39.0–52.0)
HCT: 31.6 % — ABNORMAL LOW (ref 39.0–52.0)
HCT: 34.5 % — ABNORMAL LOW (ref 39.0–52.0)
HCT: 40.9 % (ref 39.0–52.0)
Hemoglobin: 9.9 g/dL — ABNORMAL LOW (ref 13.0–17.0)
Hemoglobin: 11.1 g/dL — ABNORMAL LOW (ref 13.0–17.0)
MCH: 28 pg (ref 26.0–34.0)
MCHC: 32.8 g/dL (ref 30.0–36.0)
MCHC: 33.2 g/dL (ref 30.0–36.0)
MCHC: 33.8 g/dL (ref 30.0–36.0)
MCV: 89.7 fL (ref 78.0–100.0)
MCV: 90.6 fL (ref 78.0–100.0)
MCV: 91 fL (ref 78.0–100.0)
MCV: 91.1 fL (ref 78.0–100.0)
Platelets: 416 10*3/uL — ABNORMAL HIGH (ref 150–400)
Platelets: 141 10*3/uL — ABNORMAL LOW (ref 150–400)
Platelets: 154 10*3/uL (ref 150–400)
Platelets: 160 10*3/uL (ref 150–400)
RBC: 3.54 MIL/uL — ABNORMAL LOW (ref 4.22–5.81)
RBC: 3.79 MIL/uL — ABNORMAL LOW (ref 4.22–5.81)
RBC: 3.94 MIL/uL — ABNORMAL LOW (ref 4.22–5.81)
RDW: 19.2 % — ABNORMAL HIGH (ref 11.5–15.5)
RDW: 19.8 % — ABNORMAL HIGH (ref 11.5–15.5)
WBC: 9.1 10*3/uL (ref 4.0–10.5)
WBC: 12 10*3/uL — ABNORMAL HIGH (ref 4.0–10.5)
WBC: 14.7 10*3/uL — ABNORMAL HIGH (ref 4.0–10.5)
WBC: 6.3 10*3/uL (ref 4.0–10.5)
WBC: 7.1 10*3/uL (ref 4.0–10.5)

## 2011-01-15 LAB — DIFFERENTIAL
Basophils Absolute: 0 10*3/uL (ref 0.0–0.1)
Basophils Absolute: 0 10*3/uL (ref 0.0–0.1)
Basophils Absolute: 0 10*3/uL (ref 0.0–0.1)
Basophils Relative: 0 % (ref 0–1)
Basophils Relative: 0 % (ref 0–1)
Eosinophils Absolute: 0 10*3/uL (ref 0.0–0.7)
Eosinophils Absolute: 0 10*3/uL (ref 0.0–0.7)
Eosinophils Absolute: 0.1 10*3/uL (ref 0.0–0.7)
Eosinophils Relative: 0 % (ref 0–5)
Eosinophils Relative: 0 % (ref 0–5)
Eosinophils Relative: 1 % (ref 0–5)
Lymphocytes Relative: 13 % (ref 12–46)
Lymphocytes Relative: 6 % — ABNORMAL LOW (ref 12–46)
Lymphs Abs: 0.4 10*3/uL — ABNORMAL LOW (ref 0.7–4.0)
Monocytes Absolute: 0.5 10*3/uL (ref 0.1–1.0)
Monocytes Absolute: 0.6 10*3/uL (ref 0.1–1.0)
Monocytes Relative: 8 % (ref 3–12)
Neutro Abs: 7.5 10*3/uL (ref 1.7–7.7)
Neutro Abs: 6.2 10*3/uL (ref 1.7–7.7)
Neutrophils Relative %: 83 % — ABNORMAL HIGH (ref 43–77)
Neutrophils Relative %: 77 % (ref 43–77)
Neutrophils Relative %: 83 % — ABNORMAL HIGH (ref 43–77)

## 2011-01-15 LAB — COMPREHENSIVE METABOLIC PANEL
AST: 29 U/L (ref 0–37)
Albumin: 2.9 g/dL — ABNORMAL LOW (ref 3.5–5.2)
Alkaline Phosphatase: 66 U/L (ref 39–117)
BUN: 32 mg/dL — ABNORMAL HIGH (ref 6–23)
BUN: 48 mg/dL — ABNORMAL HIGH (ref 6–23)
CO2: 22 mEq/L (ref 19–32)
CO2: 26 mEq/L (ref 19–32)
Chloride: 98 mEq/L (ref 96–112)
Chloride: 99 mEq/L (ref 96–112)
Creatinine, Ser: 11.47 mg/dL — ABNORMAL HIGH (ref 0.4–1.5)
Creatinine, Ser: 9.45 mg/dL — ABNORMAL HIGH (ref 0.4–1.5)
GFR calc non Af Amer: 5 mL/min — ABNORMAL LOW (ref >60)
GFR calc non Af Amer: 6 mL/min — ABNORMAL LOW (ref >60)
Glucose, Bld: 140 mg/dL — ABNORMAL HIGH (ref 70–99)
Glucose, Bld: 81 mg/dL (ref 70–99)
Potassium: 5.8 mEq/L — ABNORMAL HIGH (ref 3.5–5.1)
Total Bilirubin: 1 mg/dL (ref 0.3–1.2)
Total Bilirubin: 1.4 mg/dL — ABNORMAL HIGH (ref 0.3–1.2)

## 2011-01-15 LAB — CULTURE, BLOOD (ROUTINE X 2)
Culture: NO GROWTH
Culture: NO GROWTH

## 2011-01-15 LAB — PHOSPHORUS
Phosphorus: 4.9 mg/dL — ABNORMAL HIGH (ref 2.3–4.6)
Phosphorus: 4.7 mg/dL — ABNORMAL HIGH (ref 2.3–4.6)

## 2011-01-15 LAB — GLUCOSE, CAPILLARY: Glucose-Capillary: 57 mg/dL — ABNORMAL LOW (ref 70–99)

## 2011-01-15 LAB — WOUND CULTURE

## 2011-01-15 LAB — LACTIC ACID, PLASMA
Lactic Acid, Venous: 2.1 mmol/L (ref 0.5–2.2)
Lactic Acid, Venous: 2.3 mmol/L — ABNORMAL HIGH (ref 0.5–2.2)
Lactic Acid, Venous: 2.4 mmol/L — ABNORMAL HIGH (ref 0.5–2.2)

## 2011-01-15 LAB — MRSA PCR SCREENING: MRSA by PCR: NEGATIVE

## 2011-01-15 LAB — RENAL FUNCTION PANEL
BUN: 72 mg/dL — ABNORMAL HIGH (ref 6–23)
CO2: 24 mEq/L (ref 19–32)
Chloride: 93 mEq/L — ABNORMAL LOW (ref 96–112)
Creatinine, Ser: 11.83 mg/dL — ABNORMAL HIGH (ref 0.4–1.5)
Glucose, Bld: 108 mg/dL — ABNORMAL HIGH (ref 70–99)

## 2011-01-15 LAB — APTT: aPTT: 55 seconds — ABNORMAL HIGH (ref 24–37)

## 2011-01-15 LAB — CORTISOL: Cortisol, Plasma: 15 ug/dL

## 2011-01-16 ENCOUNTER — Inpatient Hospital Stay (HOSPITAL_COMMUNITY): Payer: Medicare Other

## 2011-01-16 DIAGNOSIS — I120 Hypertensive chronic kidney disease with stage 5 chronic kidney disease or end stage renal disease: Secondary | ICD-10-CM

## 2011-01-16 DIAGNOSIS — N186 End stage renal disease: Secondary | ICD-10-CM

## 2011-01-16 LAB — BASIC METABOLIC PANEL
CO2: 20 mEq/L (ref 19–32)
Calcium: 8.9 mg/dL (ref 8.4–10.5)
Creatinine, Ser: 10.44 mg/dL — ABNORMAL HIGH (ref 0.4–1.5)
GFR calc Af Amer: 6 mL/min — ABNORMAL LOW (ref >60)
GFR calc non Af Amer: 5 mL/min — ABNORMAL LOW (ref >60)
Glucose, Bld: 114 mg/dL — ABNORMAL HIGH (ref 70–99)
Sodium: 135 mEq/L (ref 135–145)

## 2011-01-16 LAB — RENAL FUNCTION PANEL
Albumin: 2.9 g/dL — ABNORMAL LOW (ref 3.5–5.2)
BUN: 84 mg/dL — ABNORMAL HIGH (ref 6–23)
Calcium: 8.4 mg/dL (ref 8.4–10.5)
Glucose, Bld: 105 mg/dL — ABNORMAL HIGH (ref 70–99)
Phosphorus: 6.4 mg/dL — ABNORMAL HIGH (ref 2.3–4.6)

## 2011-01-16 LAB — CBC
HCT: 34.6 % — ABNORMAL LOW (ref 39.0–52.0)
Hemoglobin: 10.4 g/dL — ABNORMAL LOW (ref 13.0–17.0)
MCH: 28 pg (ref 26.0–34.0)
MCHC: 30.5 g/dL (ref 30.0–36.0)
MCHC: 30.6 g/dL (ref 30.0–36.0)
MCV: 92.3 fL (ref 78.0–100.0)
Platelets: 348 10*3/uL (ref 150–400)
RDW: 18.8 % — ABNORMAL HIGH (ref 11.5–15.5)
RDW: 19.1 % — ABNORMAL HIGH (ref 11.5–15.5)

## 2011-01-16 LAB — VANCOMYCIN, RANDOM: Vancomycin Rm: 40.7 ug/mL

## 2011-01-16 LAB — DIFFERENTIAL
Basophils Absolute: 0 10*3/uL (ref 0.0–0.1)
Basophils Relative: 0 % (ref 0–1)
Eosinophils Absolute: 0 10*3/uL (ref 0.0–0.7)
Eosinophils Relative: 0 % (ref 0–5)
Lymphocytes Relative: 8 % — ABNORMAL LOW (ref 12–46)
Lymphs Abs: 0.8 10*3/uL (ref 0.7–4.0)
Neutrophils Relative %: 85 % — ABNORMAL HIGH (ref 43–77)

## 2011-01-17 ENCOUNTER — Ambulatory Visit: Payer: Medicare Other | Admitting: Vascular Surgery

## 2011-01-27 NOTE — Discharge Summary (Signed)
NAME:  [name], [name]               ACCOUNT NO.:  [id number]  MEDICAL RECORD NO.:  [id number]           PATIENT TYPE:  I  LOCATION:  3735                         FACILITY:  [facility]  PHYSICIAN:  Rock Nephew, MD       DATE OF BIRTH:  1966-03-17  DATE OF ADMISSION:  01/05/2011 DATE OF DISCHARGE:  01/17/2011                        DISCHARGE SUMMARY - REFERRING   PRIMARY CARE PHYSICIAN:  Dr. Darrick Penna of Renal.  DISCHARGE DIAGNOSES: 1. Status post septic shock, resolved. 2. End-stage renal disease, on hemodialysis Tuesday, Thursday,     Saturday. 3. Hypertension. 4. Atrial fibrillation, on Coumadin. 5. Possible atrial clot, cannot rule out atrial mass or clot. 6. Antiphospholipid antibody syndrome, on anticoagulation. 7. Anemia of chronic kidney disease. 8. Blindness. 9. History of anxiety. 10.Insomnia. 11.History of gastroesophageal reflux disease. 12.History of small-bowel obstruction.  DISCHARGE MEDICATIONS: 1. Ciprofloxacin 500 mg p.o. daily till January 29, 2011. 2. Lovenox 80 mg subcu q.24 h. 5 days. 3. Metronidazole 500 mg p.o. 3 times a day till January 29, 2011. 4. Nutritional supplement soy for impaired renal function 237 mL by     mouth 3 times a day as needed. 5. Vancomycin injection take with hemodialysis till January 29, 2011. 6. Ambien 5 mg p.o. daily at bedtime. 7. Aspirin 81 mg p.o. daily at bedtime. 8. Benadryl 25 mg 2 tablets by mouth twice daily as needed with     dialysis. 9. Excedrin 1 tablet by mouth every 4 hours as needed. 10.Famotidine 20 mg 1 tablet by mouth daily at bedtime. 11.Hydroxyzine 25 mg 1 tablet by mouth every 8 hours as needed. 12.Lorazepam 1 mg 2 tablets by mouth twice daily as needed. 13.Metamucil 2 capsules by mouth daily at bedtime. 14.Midodrine 5 mg 2 tablets by mouth before dialysis. 15.Patanol 1 drop in both eyes twice daily as needed. 16.Renagel 800 mg 3 tablets by mouth 3 times a day with meals. 17.Renal vitamin 1 tablet p.o. daily at  bedtime. 18.Simvastatin 40 mg p.o. at bedtime. 19.Sorbitol over-the-counter one couple by mouth daily as needed. 20.Vitamin D over-the-counter 1 tablet by mouth daily at bedtime. 21.Vitamin B12 over-the-counter 1 tablet by mouth daily at bedtime. 22.Vitamin E over-the-counter 1 capsule by mouth daily at bedtime. 23.Warfarin, he takes 1.5 tablets on Tuesdays, Thursdays, and     Saturdays and 1 tablet rest of the week.  DISPOSITION:  The patient is discharged home with home wound care and home nursing.  DIET:  Renal.  CONSULTATIONS ON THIS CASE:  [employer].  Infectious Diseases, Dr. Cliffton Asters.  Initially, he was on Critical Care Service, Leonides Sake, Vascular Surgery.  FOLLOWUP:  The patient should follow up with the following physicians: 1. The patient should follow up with Dr. Darrick Penna with hemodialysis     on January 18, 2011.  At that time, the patient should have a PT/INR     checked.  He will be followed by Dr.  Darrick Penna. 2. The patient should also advanced home care for would dressing     changes as well as RN for Lovenox injections. 3. The patient should also have follow up with Dr.  Leonides Sake in 1     week.  The patient should also have an outpatient transesophageal     echocardiogram to rule out right atrial mass, thrombus.  PROCEDURES PERFORMED:  The patient had placement of a new femoral 55-cm Diatek catheter as the right femoral catheter was thought to be the source of infection for the septic shock.  The patient also had a creation of left thigh AV graft inferior vena cavogram, left iliac venogram, and left inferior venacavagram done by Dr. Imogene Burn on January 01, 2011.  The patient also had 2-D echocardiogram on January 08, 2011, which showed a left ventricular ejection fraction of 60-65%, wall motion was normal.  No regional wall motion abnormalities.  Dopplers are consistent with high ventricular filling pressures.  BRIEF HISTORY OF PRESENT ILLNESS:   This is a 45 year old male who had left femoral hemodialysis graft on March 07 and right femoral cath also. He complains of pain in the left graft site and was admitted to [facility] for sepsis as well as drainage from the graft site, transferred to [facility] for the care.  HOSPITAL COURSE: 1. Status post septic shock.  The patient was placed on broad-spectrum     antibiotics.  The patient had exploration of the left graft, and it     was thought that the source of the infection was actually the right     femoral catheter, which has since been removed.  The patient has     been seen by Dr. Imogene Burn in Vascular Surgery as well as ID.     Currently, the patient's antibiotic regimen is vancomycin, Cipro,     and Flagyl, which should run till January 29, 2011.  All the     patient's cultures have been negative so far. 2. End-stage renal disease, on hemodialysis Tuesday, Thursday, and     Saturday.  The patient should continue Tuesday, Thursday, Saturday     hemodialysis. 3. Hypertension.  The patient briefly had hypotension and required     Levophed, secondary to septic shock.  The patient's blood pressure     is controlled right now.  The patient is actually not receiving any     antihypertensives anymore.  The patient actually received midodrine     before hemodialysis. 4. Atrial fibrillation.  The patient has AFib.  He seems to be rate     controlled, and he is on Coumadin also for the antiphospholipid     antibody syndrome.  The patient received Lovenox.  Coumadin was     held, secondary to need for procedures.  Right now, the patient's     Coumadin has been restarted.  The patient will be sent home on     Coumadin and 5-day Lovenox bridge. 5. Cannot rule out right atrial mass.  The patient was noted to have a     2-D echocardiogram again on January 08, 2011.  A 2-D echocardiogram     impression was cannot rule out small right atrial mass on apical     four-chamber view, suggest TEE to further  evaluate.  The patient     should have an outpatient TEE done. 6. Antiphospholipid antibody syndrome.  The patient has a diagnosis of     antiphospholipid antibody syndrome.  He is on Coumadin for that.     Again, the patient is discharged home on Lovenox 5 days as well as     Lovenox 80 mg subcu q.24 h. as well as home dose  Coumadin. 7. Anemia of chronic kidney disease.  The patient received Aranesp. 8. Blindness.  The patient is legally blind.  He told me that the     reason for his blindness is hypertension.  He lives alone, and he     is able to take care of himself.  He was also seen by PT/OT, and he     was deemed okay to go home. 9. For DVT prophylaxis, he is receiving warfarin.     Rock Nephew, MD     NH/MEDQ  D:  01/17/2011  T:  01/17/2011  Job:  [id number]  cc:   Dr. Nathaniel Man, MD Cliffton Asters, M.D.  Electronically Signed by Rock Nephew MD on 01/27/2011 10:36:06 AM

## 2011-01-27 NOTE — Discharge Summary (Addendum)
  NAME:  [name], NEPHEW NO.:  [id number]  MEDICAL RECORD NO.:  [id number]           PATIENT TYPE:  I  LOCATION:  2002                         FACILITY:  [facility]  PHYSICIAN:  Fransisco Hertz, MD       DATE OF BIRTH:  06-02-1966  DATE OF ADMISSION:  01/01/2011 DATE OF DISCHARGE:  01/02/2011                              DISCHARGE SUMMARY   CHIEF COMPLAINT:  Need for hemodialysis access.  The patient has end- stage renal disease.  HISTORY OF PRESENT ILLNESS:  Mr. [name] is a 45 year old gentleman who has undergone multiple bilateral upper extremity access, procedures, and right thigh graft, only remaining option was left thigh graft. Venograms performed on him demonstrated patent iliac venous system and area of narrowing in the left iliac artery.  PAST MEDICAL HISTORY:  Significant for end-stage renal disease, anemia of chronic disease, atrial fibrillation.  HOSPITAL COURSE:  The patient was taken to the operating room on January 01, 2011, for creation of left thigh AV graft and left iliac venogram and inferior vena cavagram.  Postoperatively, the patient did well.  He had no signs of steal.  The left lower extremity was warm and pink.  He had palpable pulses.  His wounds were healing well.  He was discharged home on January 02, 2011.  DISCHARGE MEDICATIONS: 1. Midodrine 5 mg 2 tablets before dialysis. 2. Multiple vitamins. 3. Sorbitol as needed. 4. Simvastatin 40 mg every other day. 5. Renal vitamins daily. 6. Renagel 800 mg 3 tabs 3 times a day with meals. 7. Some ophthalmic solution for his allergies. 8. Metamucil over the counter as needed. 9. Lorazepam 1 mg twice daily as needed for anxiety. 10.Hydroxyzine 25 mg for itching as needed every 8 hours. 11.Famotidine 20 mg daily at bedtime. 12.Excedrin Migraine every 4 hours as needed. 13.Benadryl 25 mg 2 tablets twice daily as needed. 14.Aspirin 81 mg daily. 15.Ambien 5 mg daily at bedtime as needed. 16.Warfarin 5  mg 1 tablet daily except 1.5 tablets Tuesdays,     Thursdays, and Saturdays.  FINAL DIAGNOSIS:  End-stage renal disease, need for hemodialysis access status post left femoral AV Gore-Tex graft.  All his other chronic medical conditions were stable while in-house.  DISPOSITION:  The patient will be discharged home.  He will follow up in 2 weeks with Dr. Imogene Burn.     Della Goo, PA-C   ______________________________ Fransisco Hertz, MD    RR/MEDQ  D:  01/23/2011  T:  01/24/2011  Job:  [id number]  Electronically Signed by Della Goo PA on 01/27/2011 12:40:25 PM Electronically Signed by Leonides Sake MD on 01/31/2011 04:41:59 PM

## 2011-01-28 ENCOUNTER — Ambulatory Visit: Payer: Self-pay

## 2011-01-28 NOTE — Op Note (Signed)
  NAME:  [name], [name]               ACCOUNT NO.:  [id number]  MEDICAL RECORD NO.:  [id number]           PATIENT TYPE:  I  LOCATION:  3735                         FACILITY:  [facility]  PHYSICIAN:  Di Kindle. Edilia Bo, M.D.DATE OF BIRTH:  1966/10/11  DATE OF PROCEDURE:  01/16/2011 DATE OF DISCHARGE:                              OPERATIVE REPORT   PREOPERATIVE DIAGNOSIS:  Stage V chronic kidney disease  POSTOPERATIVE DIAGNOSIS:  Stage V chronic kidney disease.  PROCEDURE:  Placement of a right femoral 55-cm Diatek catheter.  SURGEON:  Di Kindle. Edilia Bo, MD  ANESTHESIA:  Local with sedation.  TECHNIQUE:  The patient was taken to the operating room, sedated by Anesthesia.  The patient had a temporary catheter in the right groin. The patient had a functioning thigh graft on the left side.  The right groin was prepped and draped in the usual sterile fashion.  The catheter was clamped and divided.  A guidewire was introduced into the right atrium under fluoroscopic control and the old catheter was removed.  The 55-cm Diatek was threaded over the wire and advanced through the tunnel up to the right atrium.  The wire was then removed.  The exit site for the catheter was selected.  The skin anesthetized between the two areas. The catheter was then brought through the tunnel, cut to the appropriate length, and distal ports were attached.  Both ports withdrew easily.  We then flushed with heparinized saline and filled with concentrated heparin.  Catheter was secured at its exit site with a 3-0 nylon suture. The cannulation site was closed with a 4-0 Monocryl suture.  Dermabond was applied.  Catheter was filled with concentrated heparin and a sterile dressing was applied.  The patient tolerated the procedure well and was transferred to the recovery room in a stable condition.  All needle and sponge counts were correct.     Di Kindle. Edilia Bo, M.D.     CSD/MEDQ  D:   01/16/2011  T:  01/17/2011  Job:  [id number]  Electronically Signed by Waverly Ferrari M.D. on 01/28/2011 07:42:11 AM

## 2011-01-29 LAB — PROTIME-INR
INR: 1.36 (ref 0.00–1.49)
INR: 1.56 — ABNORMAL HIGH (ref 0.00–1.49)
INR: 2.17 — ABNORMAL HIGH (ref 0.00–1.49)
INR: 2.79 — ABNORMAL HIGH (ref 0.00–1.49)
INR: 3.38 — ABNORMAL HIGH (ref 0.00–1.49)
Prothrombin Time: 16.7 seconds — ABNORMAL HIGH (ref 11.6–15.2)
Prothrombin Time: 18.5 seconds — ABNORMAL HIGH (ref 11.6–15.2)
Prothrombin Time: 29.2 seconds — ABNORMAL HIGH (ref 11.6–15.2)

## 2011-01-29 LAB — DIFFERENTIAL
Basophils Absolute: 0.1 10*3/uL (ref 0.0–0.1)
Basophils Relative: 1 % (ref 0–1)
Eosinophils Absolute: 1.5 10*3/uL — ABNORMAL HIGH (ref 0.0–0.7)
Monocytes Relative: 9 % (ref 3–12)
Neutro Abs: 6.6 10*3/uL (ref 1.7–7.7)
Neutrophils Relative %: 61 % (ref 43–77)

## 2011-01-29 LAB — CBC
HCT: 29.3 % — ABNORMAL LOW (ref 39.0–52.0)
HCT: 30.5 % — ABNORMAL LOW (ref 39.0–52.0)
HCT: 32.7 % — ABNORMAL LOW (ref 39.0–52.0)
HCT: 34.1 % — ABNORMAL LOW (ref 39.0–52.0)
Hemoglobin: 9.9 g/dL — ABNORMAL LOW (ref 13.0–17.0)
MCHC: 33.6 g/dL (ref 30.0–36.0)
MCHC: 33.6 g/dL (ref 30.0–36.0)
MCHC: 33.7 g/dL (ref 30.0–36.0)
MCHC: 34 g/dL (ref 30.0–36.0)
MCV: 95.2 fL (ref 78.0–100.0)
MCV: 95.3 fL (ref 78.0–100.0)
MCV: 95.7 fL (ref 78.0–100.0)
Platelets: 152 10*3/uL (ref 150–400)
Platelets: 191 10*3/uL (ref 150–400)
Platelets: 192 10*3/uL (ref 150–400)
Platelets: 209 10*3/uL (ref 150–400)
Platelets: 215 10*3/uL (ref 150–400)
RBC: 3.07 MIL/uL — ABNORMAL LOW (ref 4.22–5.81)
RBC: 3.4 MIL/uL — ABNORMAL LOW (ref 4.22–5.81)
RBC: 3.81 MIL/uL — ABNORMAL LOW (ref 4.22–5.81)
RDW: 16.7 % — ABNORMAL HIGH (ref 11.5–15.5)
RDW: 16.9 % — ABNORMAL HIGH (ref 11.5–15.5)
RDW: 16.9 % — ABNORMAL HIGH (ref 11.5–15.5)
RDW: 17.2 % — ABNORMAL HIGH (ref 11.5–15.5)
RDW: 17.3 % — ABNORMAL HIGH (ref 11.5–15.5)
RDW: 17.3 % — ABNORMAL HIGH (ref 11.5–15.5)
WBC: 7.2 10*3/uL (ref 4.0–10.5)
WBC: 9.4 10*3/uL (ref 4.0–10.5)

## 2011-01-29 LAB — GLUCOSE, CAPILLARY
Glucose-Capillary: 104 mg/dL — ABNORMAL HIGH (ref 70–99)
Glucose-Capillary: 124 mg/dL — ABNORMAL HIGH (ref 70–99)
Glucose-Capillary: 81 mg/dL (ref 70–99)
Glucose-Capillary: 81 mg/dL (ref 70–99)
Glucose-Capillary: 90 mg/dL (ref 70–99)
Glucose-Capillary: 90 mg/dL (ref 70–99)
Glucose-Capillary: 92 mg/dL (ref 70–99)

## 2011-01-29 LAB — RENAL FUNCTION PANEL
Albumin: 2.9 g/dL — ABNORMAL LOW (ref 3.5–5.2)
Albumin: 3 g/dL — ABNORMAL LOW (ref 3.5–5.2)
BUN: 18 mg/dL (ref 6–23)
BUN: 20 mg/dL (ref 6–23)
Calcium: 9.2 mg/dL (ref 8.4–10.5)
Chloride: 97 mEq/L (ref 96–112)
Chloride: 99 mEq/L (ref 96–112)
Creatinine, Ser: 7.88 mg/dL — ABNORMAL HIGH (ref 0.4–1.5)
Creatinine, Ser: 8.1 mg/dL — ABNORMAL HIGH (ref 0.4–1.5)
GFR calc Af Amer: 5 mL/min — ABNORMAL LOW (ref >60)
GFR calc non Af Amer: 4 mL/min — ABNORMAL LOW (ref >60)
Glucose, Bld: 86 mg/dL (ref 70–99)
Phosphorus: 4.8 mg/dL — ABNORMAL HIGH (ref 2.3–4.6)
Potassium: 4.4 mEq/L (ref 3.5–5.1)
Potassium: 5.2 mEq/L — ABNORMAL HIGH (ref 3.5–5.1)

## 2011-01-29 LAB — BASIC METABOLIC PANEL
BUN: 38 mg/dL — ABNORMAL HIGH (ref 6–23)
BUN: 47 mg/dL — ABNORMAL HIGH (ref 6–23)
CO2: 25 mEq/L (ref 19–32)
CO2: 27 mEq/L (ref 19–32)
CO2: 28 mEq/L (ref 19–32)
Calcium: 8.9 mg/dL (ref 8.4–10.5)
Calcium: 9 mg/dL (ref 8.4–10.5)
Calcium: 9.3 mg/dL (ref 8.4–10.5)
Calcium: 9.6 mg/dL (ref 8.4–10.5)
Chloride: 101 mEq/L (ref 96–112)
Creatinine, Ser: 10.8 mg/dL — ABNORMAL HIGH (ref 0.4–1.5)
Creatinine, Ser: 12.17 mg/dL — ABNORMAL HIGH (ref 0.4–1.5)
Creatinine, Ser: 12.9 mg/dL — ABNORMAL HIGH (ref 0.4–1.5)
Creatinine, Ser: 6.22 mg/dL — ABNORMAL HIGH (ref 0.4–1.5)
GFR calc Af Amer: 12 mL/min — ABNORMAL LOW (ref >60)
GFR calc Af Amer: 12 mL/min — ABNORMAL LOW (ref >60)
GFR calc Af Amer: 6 mL/min — ABNORMAL LOW (ref >60)
GFR calc non Af Amer: 4 mL/min — ABNORMAL LOW (ref >60)
GFR calc non Af Amer: 5 mL/min — ABNORMAL LOW (ref >60)
Glucose, Bld: 64 mg/dL — ABNORMAL LOW (ref 70–99)
Glucose, Bld: 88 mg/dL (ref 70–99)
Glucose, Bld: 92 mg/dL (ref 70–99)
Potassium: 4.2 mEq/L (ref 3.5–5.1)
Potassium: 6.1 mEq/L — ABNORMAL HIGH (ref 3.5–5.1)
Sodium: 138 mEq/L (ref 135–145)

## 2011-01-29 LAB — PHOSPHORUS: Phosphorus: 4.4 mg/dL (ref 2.3–4.6)

## 2011-01-30 ENCOUNTER — Emergency Department (HOSPITAL_COMMUNITY)
Admission: EM | Admit: 2011-01-30 | Discharge: 2011-01-31 | Discharge status: Against Medical Advice | Payer: Medicare Other | Attending: Emergency Medicine | Admitting: Emergency Medicine

## 2011-01-30 ENCOUNTER — Emergency Department (HOSPITAL_COMMUNITY): Payer: Medicare Other

## 2011-01-30 DIAGNOSIS — Z79899 Other long term (current) drug therapy: Secondary | ICD-10-CM | POA: Insufficient documentation

## 2011-01-30 DIAGNOSIS — R079 Chest pain, unspecified: Secondary | ICD-10-CM | POA: Insufficient documentation

## 2011-01-30 DIAGNOSIS — N189 Chronic kidney disease, unspecified: Secondary | ICD-10-CM | POA: Insufficient documentation

## 2011-01-30 DIAGNOSIS — R000 Tachycardia, unspecified: Secondary | ICD-10-CM | POA: Insufficient documentation

## 2011-01-30 DIAGNOSIS — R609 Edema, unspecified: Secondary | ICD-10-CM | POA: Insufficient documentation

## 2011-01-30 DIAGNOSIS — H543 Unqualified visual loss, both eyes: Secondary | ICD-10-CM | POA: Insufficient documentation

## 2011-01-30 DIAGNOSIS — I4891 Unspecified atrial fibrillation: Secondary | ICD-10-CM | POA: Insufficient documentation

## 2011-01-30 DIAGNOSIS — I129 Hypertensive chronic kidney disease with stage 1 through stage 4 chronic kidney disease, or unspecified chronic kidney disease: Secondary | ICD-10-CM | POA: Insufficient documentation

## 2011-01-30 DIAGNOSIS — M7989 Other specified soft tissue disorders: Secondary | ICD-10-CM | POA: Insufficient documentation

## 2011-01-30 LAB — BASIC METABOLIC PANEL
Chloride: 98 mEq/L (ref 96–112)
GFR calc Af Amer: 8 mL/min — ABNORMAL LOW (ref >60)
Potassium: 4.5 mEq/L (ref 3.5–5.1)
Sodium: 137 mEq/L (ref 135–145)

## 2011-01-30 LAB — CBC
HCT: 38.1 % — ABNORMAL LOW (ref 39.0–52.0)
MCV: 94.3 fL (ref 78.0–100.0)
RBC: 4.04 MIL/uL — ABNORMAL LOW (ref 4.22–5.81)
WBC: 6.5 10*3/uL (ref 4.0–10.5)

## 2011-01-30 LAB — DIFFERENTIAL
Lymphocytes Relative: 17 % (ref 12–46)
Lymphs Abs: 1.1 10*3/uL (ref 0.7–4.0)
Monocytes Relative: 7 % (ref 3–12)
Neutrophils Relative %: 69 % (ref 43–77)

## 2011-01-30 LAB — D-DIMER, QUANTITATIVE: D-Dimer, Quant: 3.18 ug/mL-FEU — ABNORMAL HIGH (ref 0.00–0.48)

## 2011-01-31 ENCOUNTER — Ambulatory Visit (HOSPITAL_COMMUNITY)
Admission: RE | Admit: 2011-01-31 | Discharge: 2011-01-31 | Disposition: A | Discharge status: Home / Self Care | Payer: Medicare Other | Source: Ambulatory Visit | Attending: Emergency Medicine | Admitting: Emergency Medicine

## 2011-01-31 DIAGNOSIS — M79609 Pain in unspecified limb: Secondary | ICD-10-CM

## 2011-02-01 ENCOUNTER — Other Ambulatory Visit: Payer: Self-pay | Admitting: Nephrology

## 2011-02-04 ENCOUNTER — Other Ambulatory Visit: Payer: Self-pay | Admitting: Nephrology

## 2011-02-06 LAB — BASIC METABOLIC PANEL
CO2: 24 mEq/L (ref 19–32)
Calcium: 9 mg/dL (ref 8.4–10.5)
Chloride: 98 mEq/L (ref 96–112)
GFR calc Af Amer: 6 mL/min — ABNORMAL LOW (ref >60)
Potassium: 4.3 mEq/L (ref 3.5–5.1)
Sodium: 137 mEq/L (ref 135–145)

## 2011-02-06 LAB — CBC
Hemoglobin: 11.9 g/dL — ABNORMAL LOW (ref 13.0–17.0)
MCHC: 33.9 g/dL (ref 30.0–36.0)
MCV: 93.7 fL (ref 78.0–100.0)
RBC: 3.76 MIL/uL — ABNORMAL LOW (ref 4.22–5.81)

## 2011-02-06 LAB — PROTIME-INR: INR: 1.1 (ref 0.00–1.49)

## 2011-02-14 ENCOUNTER — Ambulatory Visit: Payer: Medicare Other

## 2011-02-21 ENCOUNTER — Ambulatory Visit: Payer: Medicare Other

## 2011-02-28 ENCOUNTER — Ambulatory Visit (INDEPENDENT_AMBULATORY_CARE_PROVIDER_SITE_OTHER): Payer: Medicare Other

## 2011-02-28 DIAGNOSIS — N186 End stage renal disease: Secondary | ICD-10-CM

## 2011-03-01 NOTE — Assessment & Plan Note (Signed)
OFFICE VISIT  BACH, ROCCHI DOB:  28-May-1966                                       02/28/2011 EAVWU#:[id number]  Dr. Imogene Burn is the attending physician in the office.  CHIEF COMPLAINT:  Postop check left femoral AV Gore-Tex graft.  The patient is a 45 year old gentleman who had a left femoral AV Gore- Tex graft with iliac venogram on 01/11/2011 by Dr. Imogene Burn.  He was sent here for evaluation of the graft by the dialysis center to see if it was ready for access.  The wounds in the left lower extremity are healing well.  He has a positive pulse in the graft and a good bruit throughout. Per Doppler he had good flow in the graft.  The graft was somewhat deep but palpable at the lower pole at the curvature of the graft and should be able to be accessed without difficulty.  He will return to short stay once they have accessed the graft and are pleased with flow in the graft and we will remove his right femoral Diatek graft.  Della Goo, PA-C  Fransisco Hertz, MD Electronically Signed  RR/MEDQ  D:  03/01/2011  T:  03/01/2011  Job:  [id number]

## 2011-03-11 NOTE — Assessment & Plan Note (Signed)
OFFICE VISIT   ESA, RADEN  DOB:  November 09, 1965                                       09/06/2010  [id number]#:[id number]   This is a 45 year old gentleman status post a left leg venogram that I  did to demonstrate patency of his left iliac and femoral vein system for  a left thigh graft.  He has previously had a tunneled dialysis  catheters, so I had concerns whether or not he has some of stenosis but  it looks that in the mid segment of this iliofemoral segment, there is  an area of some of stenosis but this appears to be at least 5-6 mm in  diameter even at this segment.  We spent about 15 minutes discussing the  procedure that was being proposed, the left thigh arteriovenous graft.  We discussed extensively the risks of this procedure which included but  were not limited to nerve damage, steal syndrome, possible ischemic  monomelic neuropathy, possible failure to mature need or additional  procedures.  He is aware of these and has agreed to proceed forward with  the procedure which tentatively is going to be scheduled for September 25, 2010.     Leonides Sake, MD  Electronically Signed   BC/MEDQ  D:  09/06/2010  T:  09/09/2010  Job:  [id number]

## 2011-03-11 NOTE — Procedures (Signed)
CEPHALIC VEIN MAPPING   INDICATION:  End-stage renal disease, preop AVF placement.   HISTORY:  Diabetic.   EXAM:  The right cephalic vein is compressible.   Diameter measurements range from 0.15 cm to 0.22 cm.   The left cephalic vein is compressible.   Diameter measurements range from 0.35 to 0.40 cm.   See attached worksheet for all measurements.   IMPRESSION:  1. Patent bilateral cephalic veins.  2. Due to the presence of acoustic shadowing caused by the failed      graft in both arms, the basilic veins were unable to be visualized.   ___________________________________________  Leonides Sake, MD   EM/MEDQ  D:  08/09/2010  T:  08/09/2010  Job:  [id number]

## 2011-03-11 NOTE — Assessment & Plan Note (Signed)
OFFICE VISIT   [name], [name]  DOB:  [birth year]                                       04/10/2010  ZOXWR#:[id number]   The patient is a 45 year old male who has been on hemodialysis greater  than 20 years.  He was seen in January of this year by me for  consideration of a new AV graft.  At that time it was noted he has  contractures in his leg from nephrosclerosis from an MRI.  Although it  was discussed with the patient that a left thigh graft might be possible  he never received the central venogram on the left side that was  scheduled for him.  He has previously had multiple accesses in both arms  and in the right leg.  He was then further seen by [name] in March  of 2011 for consideration for placement of a left thigh graft but  apparently was lost to followup.  He recently had a catheter problem on  the right side and this was recently replaced in radiology on 05/13.  He  presents today for further evaluation and placement of a new access.   He currently dialyzes on Tuesday, Thursday and Saturday.  His catheter  has been working okay so far.   PHYSICAL EXAM:  Blood pressure is 86/54 in the left arm, heart rate 92  and regular.  Temperature is 97.4.  The patient was placed in a supine  position.  He has a 30 degree contracture of his left hip and a 30  degree contracture of his left knee.  This is also symmetric on the  right side.  He does have a 2+ left femoral pulse.  Blood pressure today  was 86/54 in the left arm, heart rate 92 and regular.  Temperature 97.4.   He denies any shortness of breath or chest pain today.   In summary, the patient is still a potential candidate for placement of  a left thigh AV graft.  However, we do need to obtain a central venogram  of his left side to make sure his venous outflow is patent.  I did  discuss with the patient today that the graft would be at increased risk  of early occlusion due to contracture  in his left leg.  He understands  and agrees to proceed if his central veins are patent.  He will have his  central venogram scheduled in the near future and will follow up with me  afterwards to discuss the findings and plan left thigh AV graft for him.     Janetta Hora. Fields, MD  Electronically Signed   CEF/MEDQ  D:  04/10/2010  T:  04/11/2010  Job:  [id number]   cc:   Zetta Bills, MD

## 2011-03-11 NOTE — Assessment & Plan Note (Signed)
OFFICE VISIT   Richard, [name]  DOB:  [birth year]                                       08/09/2010  HQION#:[id number]   This is an established patient followup.   HISTORY OF PRESENT ILLNESS:  This is a 45 year old gentleman well-known  to this practice from over 27 separate access procedures that presents  for chief complaint of new access.  Previously he has had multiple upper  extremity procedures and most recently required a right femoral tunneled  dialysis catheter placement due to possible infection of his left  femoral tunneled dialysis catheter.  Per the patient his previous left  femoral tunneled dialysis catheter has been in place for over a year.  He denies any complications of steal or any permanent motor problems  from his previous access.   PAST MEDICAL HISTORY:  1. Hypertension.  2. Coronary artery disease with previous MI.  3. Hyperlipidemia.  4. Legally blind.  5. Nephrogenic systemic fibrosis.  6. Atrial fibrillation.  7. Congestive heart failure.  8. History of partial small bowel obstruction.  9. Antiphospholipid syndrome.   PAST SURGICAL HISTORY:  Extensive bilateral upper extremity grafts and  fistulas.  Per the patient a history of also a right thigh graft.  Additionally, he has had peritoneal dialysis catheter placed previously  that required removal, partial colectomy and cardiac catheterization.   SOCIAL HISTORY:  Denies any tobacco, alcohol or illicit drug use.   FAMILY HISTORY:  He does not know the family history of his mother or  father in terms of any medical problems.   MEDICATIONS:  He was on Rena-Vite, Coumadin, Ambien, aspirin, Ativan,  multivitamin, Prevacid, Renagel, simvastatin, Percocet, Epogen and  Xanax.   ALLERGIES:  He has no known drug allergies.   REVIEW OF SYSTEMS:  Today he noted headache, pain with walking, pain  with lying flat, temporary blindness, arthritis, joint pain, muscle  pain, kidney  disease, depression, anxiety and rashes.  Otherwise the  rest of a 12 point review of system was noted to be negative.   PHYSICAL EXAMINATION:  Vital signs:  He had vital signs of blood  pressure 95/61, heart rate of 105, Respiratory rate 12.  General:  He is well-developed, well-nourished, in no apparent distress.  Head:  Normocephalic, atraumatic.  ENT:  Hearing was grossly intact.  Nares without any erythema or obvious  drainage.  Oropharynx without any erythema or exudate.  Neck:  Supple without any nuchal rigidity.  Eyes: He had minimal pupillary reaction bilaterally.  He was able to  move his eyes to command demonstrating intact extraocular movements.  Pulmonary:  He had symmetric expansion, good air movement, clear to  auscultation bilaterally.  He had no rales, rhonchi or wheezes.  Cardiac:  Regular rate and rhythm.  Normal S1-S2.  No murmurs, rubs,  thrills or gallops.  Vascular:  He had palpable radial, brachial pulses  and carotid pulses without any bruits.  I cannot appreciate any aortic  pulse.  He had palpable femoral pulses, the left was more difficult to  palpate due to his contracture on this side.  I did not appreciate  popliteal or pedal pulses on this gentleman.  GI:  Soft, nontender, nondistended.  No guarding.  No rebound.  No  hepatosplenomegaly.  No masses.  Musculoskeletal:  He had 4/5 strength in the upper extremities.  There  are multiple incisions throughout the upper extremity with pretty much  graft both in the forearms and upper arms throughout both sides.  On his  lower extremity he has contractures of his left leg both in the knee  level and at the hip.  This limited his musculoskeletal exam as he was  not able to exert much force in either lower leg.  Skin:  He had the extremities and upper arm were described as above.  His lower extremities have a sheen in the skin without any signs of any  gangrene or ulcers.  Also of note in the right groin there is  a tunneled  dialysis catheter placed.  On the left groin there is evidence of an old  tunneled dialysis catheter exit site.  Neuro:  Cranial III-XII were intact.  Motor exam was limited by his  contractures of the lower extremities but was as noted above.  Sensory:  He did have intact sensation upper extremities and in the lower  extremities there seems to be decreased sensation in the feet.  Psychiatric:  Judgment was intact.  Mood and affect were appropriate for  his situation.  Lymphatic:  I did not appreciate any cervical, inguinal or axillary  lymphadenopathy.   He had noninvasive vascular studies completed including bilateral upper  extremity vein mapping.  All of his veins in the upper extremities were  inadequate for any additional access.   I then also obtained a SonoSite imaging of his left saphenous vein.  There was a small saphenous vein medially that was noted to be patent.  The femoral vein cannot be fully interrogated on SonoSite due to intense  scarring in the left groin.   MEDICAL DECISION MAKING:  The patient is a 45 year old patient with  multiple previous upper extremity accesses.  He is no longer a candidate  for any additional work in his upper extremities.  By report he  previously has had a right arteriovenous graft and currently the [facility] in  this groin.  In reviewing the chart Dr. Darrick Penna has previously discussed  with him placement of a left arteriovenous graft in the thigh on this  side.  Also by report he has had a tunneled dialysis catheter in this  left groin for extended period and recently notes having the tunneled  dialysis catheter in that groin removed due to infection. I think that  he will need a central venogram to ensure that the femoral vein on the  left side is intact prior to consideration of placement of any graft.  So my plan is to bring him back to the peripheral lab and under  ultrasound guidance place a micropuncture sheath into the left  saphenous  vein and perform a proximal left leg venogram which should image the  proximal saphenous vein to its junction with the femoral vein and also  then image the iliac system and the inferior vena cava.  If this is open  at that point then I think he would be a candidate possibly for a left  thigh graft though it would be somewhat complicated by his contractures  he has in this left side.     Richard Sake, MD  Electronically Signed   BC/MEDQ  D:  08/09/2010  T:  08/12/2010  Job:  [id number]

## 2011-03-11 NOTE — Assessment & Plan Note (Signed)
OFFICE VISIT   [name], [name]  DOB:  [birth year]                                       01/07/2010  VWUJW#:[id number]   Patient comes back in today to discuss options for permanent access.  He  was seen by Dr. Darrick Penna on January 26th.  In short, he is a 45 year old  gentleman, on long-term hemodialysis, greater than 20 years.  He  currently dialyzes through a left femoral catheter.  He has had multiple  grafts in his right forearm, right upper arm, left forearm, left upper  arm, and a right thigh graft which lasted approximately 3 years.  He is  blind and suffers from NSF secondary to MRI contrast and has some fairly  significant contractures in his lower extremities.  He was seen in  [place], and they discussed placing a HeRO graft.  He underwent a  central venogram in early March.  He comes back in today to discuss his  options.   PHYSICAL EXAMINATION:  His heart rate is 89.  Blood pressure is 119/80.  Temperature is 97.9.  General:  He is well-appearing in no distress.  Lungs are clear bilaterally.  He has multiple occluded access sites in  both upper extremities, occluded right femoral graft, and a catheter in  his left groin.  He has a palpable left femoral pulse.  He has fairly  significant contractures of his left hip and left knee.   DIAGNOSTIC STUDIES:  I have independently reviewed his venogram.  This  shows he has patent right brachial axillary and subclavian veins.  However, he has unoccluded superior vena cava.   PLAN:  Access in this patient is very limited, based on his superior  vena caval occlusion.  I would be reluctant to place a HeRO graft, even  though he does have a robust azygos system.  I would be concerned that  he would be at high risk for arm and neck swelling as well as poor flows  with dialysis.  I think our only option would be to place a left femoral  graft; however, with his contractures in his leg, this would be very  challenging.  It would require moving his current catheter from the left  groin through to the right groin or potentially in the right upper  extremity.  I will plan on speaking with Dr. Hyman Hopes later this week to  discuss our options.  This was reiterated with the patient.     Jorge Ny, MD  Electronically Signed   VWB/MEDQ  D:  01/07/2010  T:  01/08/2010  Job:  [id number]   cc:   Garnetta Buddy, M.D.

## 2011-03-11 NOTE — Assessment & Plan Note (Signed)
OFFICE VISIT   [name], [name]  DOB:  1966-06-30                                       11/21/2009  FAOZH#:[id number]   The patient is a 45 year old male who has been a long-term hemodialysis  access patient.  He has been on dialysis for almost 20 years.  He is  sent today for evaluation for placement of a new hemodialysis access.  He previously has had multiple grafts in the right forearm, right upper  arm, left forearm, left upper arm and most recently a right thigh graft  which lasted approximately 3 years.  The patient is blind.  He also has  nephrogenic systemic fibrosis secondary to MRI contrast.  Due to this he  has fairly severe contractures of his knee and hip joint bilaterally.   He apparently was seen by the vascular surgeons in [place] and they  discussed with him the potential of doing a HeRO graft and wanted to  proceed with central venogram.  He came here for a second opinion.  Apparently at the time they put in his left femoral dialysis catheter  they attempted to place a right-sided catheter but were unsuccessful.   He currently dialyzes on Tuesday, Thursday and Saturday at [place].   REVIEW OF SYSTEMS:  Full review of systems was performed with the  patient.  Please see intake referral form for details regarding this.   PHYSICAL EXAM:  Vital signs:  Blood pressure is 104/64 in the right arm,  his heart rate is 106, temperature is 97.8.  lower extremities:  He has  2+ femoral pulses bilaterally.  He has a 20 degree contracture of his  hip and a 30-40 degree contracture of his knee bilaterally.  When laying  him flat on the table he is unable to fully straighten his legs.  He is  a 2+ right brachial pulse.  He has absent brachial and radial pulses on  the left side.  He has absent radial pulse on the right side.  He has  multiple scars from grafts.  Chest:  Exam of the chest shows multiple  collaterals across the chest wall.   The  patient is a difficult access problem and has essentially limited  sites at this point and may have no further sites.  He is currently  dialyzing via left femoral catheter.  I believe the best option would be  as suggested in [place] would be to perform a central venogram from  above and below.  If he has a patent superior vena cava and patent  subclavian or jugular veins we could consider placing a right arm HeRO  graft since he does have a brachial pulse on that side.  If he does not  we could also study his left iliac venous system.  However, I am a  little bit reluctant to consider placing a left thigh graft as this  would be quite difficult to place due to the contracture in his left  leg.  All of this was discussed with the patient today.  He will return  for further followup after his venogram.  I also discussed the patient  with Dr. Fredia Sorrow today.  He is on Coumadin.  Dr. Fredia Sorrow said that  would be okay to continue that during his venogram procedure.     Janetta Hora. Fields, MD  Electronically  Signed   CEF/MEDQ  D:  11/21/2009  T:  11/22/2009  Job:  [id number]   cc:   Sherrine Maples T. Fredia Sorrow, M.D.  James L. Deterding, M.D.

## 2011-03-13 ENCOUNTER — Inpatient Hospital Stay (HOSPITAL_COMMUNITY)
Admission: AD | Admit: 2011-03-13 | Discharge: 2011-03-20 | DRG: 871 | Disposition: A | Discharge status: Home / Self Care | Payer: Medicare Other | Source: Ambulatory Visit | Attending: Nephrology | Admitting: Nephrology

## 2011-03-13 ENCOUNTER — Inpatient Hospital Stay (HOSPITAL_COMMUNITY): Payer: Medicare Other

## 2011-03-13 DIAGNOSIS — A419 Sepsis, unspecified organism: Secondary | ICD-10-CM | POA: Diagnosis present

## 2011-03-13 DIAGNOSIS — D649 Anemia, unspecified: Secondary | ICD-10-CM | POA: Diagnosis present

## 2011-03-13 DIAGNOSIS — Z992 Dependence on renal dialysis: Secondary | ICD-10-CM

## 2011-03-13 DIAGNOSIS — I4891 Unspecified atrial fibrillation: Secondary | ICD-10-CM | POA: Diagnosis present

## 2011-03-13 DIAGNOSIS — T827XXA Infection and inflammatory reaction due to other cardiac and vascular devices, implants and grafts, initial encounter: Secondary | ICD-10-CM | POA: Diagnosis present

## 2011-03-13 DIAGNOSIS — Y849 Medical procedure, unspecified as the cause of abnormal reaction of the patient, or of later complication, without mention of misadventure at the time of the procedure: Secondary | ICD-10-CM | POA: Diagnosis present

## 2011-03-13 DIAGNOSIS — R894 Abnormal immunological findings in specimens from other organs, systems and tissues: Secondary | ICD-10-CM | POA: Diagnosis present

## 2011-03-13 DIAGNOSIS — F341 Dysthymic disorder: Secondary | ICD-10-CM | POA: Diagnosis present

## 2011-03-13 DIAGNOSIS — A4901 Methicillin susceptible Staphylococcus aureus infection, unspecified site: Secondary | ICD-10-CM | POA: Diagnosis present

## 2011-03-13 DIAGNOSIS — A4101 Sepsis due to Methicillin susceptible Staphylococcus aureus: Principal | ICD-10-CM | POA: Diagnosis present

## 2011-03-13 DIAGNOSIS — R6520 Severe sepsis without septic shock: Secondary | ICD-10-CM | POA: Diagnosis present

## 2011-03-13 DIAGNOSIS — E785 Hyperlipidemia, unspecified: Secondary | ICD-10-CM | POA: Diagnosis present

## 2011-03-13 DIAGNOSIS — N2581 Secondary hyperparathyroidism of renal origin: Secondary | ICD-10-CM | POA: Diagnosis present

## 2011-03-13 DIAGNOSIS — T45515A Adverse effect of anticoagulants, initial encounter: Secondary | ICD-10-CM | POA: Diagnosis present

## 2011-03-13 DIAGNOSIS — D638 Anemia in other chronic diseases classified elsewhere: Secondary | ICD-10-CM | POA: Diagnosis present

## 2011-03-13 DIAGNOSIS — Z7901 Long term (current) use of anticoagulants: Secondary | ICD-10-CM

## 2011-03-13 DIAGNOSIS — N186 End stage renal disease: Secondary | ICD-10-CM | POA: Diagnosis present

## 2011-03-13 LAB — MRSA PCR SCREENING: MRSA by PCR: NEGATIVE

## 2011-03-14 ENCOUNTER — Inpatient Hospital Stay (HOSPITAL_COMMUNITY): Payer: Medicare Other

## 2011-03-14 DIAGNOSIS — N186 End stage renal disease: Secondary | ICD-10-CM

## 2011-03-14 DIAGNOSIS — A419 Sepsis, unspecified organism: Secondary | ICD-10-CM

## 2011-03-14 DIAGNOSIS — R6521 Severe sepsis with septic shock: Secondary | ICD-10-CM

## 2011-03-14 DIAGNOSIS — R6520 Severe sepsis without septic shock: Secondary | ICD-10-CM

## 2011-03-14 LAB — DIFFERENTIAL
Basophils Absolute: 0 10*3/uL (ref 0.0–0.1)
Basophils Absolute: 0 10*3/uL (ref 0.0–0.1)
Basophils Absolute: 0.1 10*3/uL (ref 0.0–0.1)
Eosinophils Absolute: 0 10*3/uL (ref 0.0–0.7)
Eosinophils Relative: 1 % (ref 0–5)
Lymphocytes Relative: 11 % — ABNORMAL LOW (ref 12–46)
Lymphocytes Relative: 12 % (ref 12–46)
Lymphocytes Relative: 13 % (ref 12–46)
Monocytes Relative: 17 % — ABNORMAL HIGH (ref 3–12)
Monocytes Relative: 11 % (ref 3–12)
Neutro Abs: 6.7 10*3/uL (ref 1.7–7.7)
Neutrophils Relative %: 72 % (ref 43–77)
Neutrophils Relative %: 74 % (ref 43–77)
Smear Review: DECREASED

## 2011-03-14 LAB — CARDIAC PANEL(CRET KIN+CKTOT+MB+TROPI)
CK, MB: 2.8 ng/mL (ref 0.3–4.0)
CK, MB: 3.6 ng/mL (ref 0.3–4.0)
Relative Index: 1.4 (ref 0.0–2.5)
Total CK: 316 U/L — ABNORMAL HIGH (ref 7–232)
Total CK: 251 U/L — ABNORMAL HIGH (ref 7–232)
Troponin I: 1.16 ng/mL (ref <0.30)
Troponin I: 1.61 ng/mL (ref <0.30)

## 2011-03-14 LAB — PROTIME-INR
INR: 2.59 — ABNORMAL HIGH (ref 0.00–1.49)
INR: 2.24 — ABNORMAL HIGH (ref 0.00–1.49)
Prothrombin Time: 27.9 seconds — ABNORMAL HIGH (ref 11.6–15.2)

## 2011-03-14 LAB — COMPREHENSIVE METABOLIC PANEL
Albumin: 3.1 g/dL — ABNORMAL LOW (ref 3.5–5.2)
BUN: 37 mg/dL — ABNORMAL HIGH (ref 6–23)
Chloride: 94 mEq/L — ABNORMAL LOW (ref 96–112)
Creatinine, Ser: 9.4 mg/dL — ABNORMAL HIGH (ref 0.4–1.5)
GFR calc non Af Amer: 6 mL/min — ABNORMAL LOW (ref >60)
Glucose, Bld: 61 mg/dL — ABNORMAL LOW (ref 70–99)
Total Bilirubin: 0.4 mg/dL (ref 0.3–1.2)

## 2011-03-14 LAB — BLOOD GAS, ARTERIAL
Bicarbonate: 25.1 mEq/L — ABNORMAL HIGH (ref 20.0–24.0)
Drawn by: 12206
FIO2: 0.21 %
Patient temperature: 98.6
pH, Arterial: 7.473 — ABNORMAL HIGH (ref 7.350–7.450)
pO2, Arterial: 62.4 mmHg — ABNORMAL LOW (ref 80.0–100.0)

## 2011-03-14 LAB — BASIC METABOLIC PANEL
GFR calc non Af Amer: 6 mL/min — ABNORMAL LOW (ref >60)
Glucose, Bld: 50 mg/dL — ABNORMAL LOW (ref 70–99)
Potassium: 5 mEq/L (ref 3.5–5.1)
Sodium: 139 mEq/L (ref 135–145)

## 2011-03-14 LAB — CBC
HCT: 36.9 % — ABNORMAL LOW (ref 39.0–52.0)
Hemoglobin: 12 g/dL — ABNORMAL LOW (ref 13.0–17.0)
Hemoglobin: 11.1 g/dL — ABNORMAL LOW (ref 13.0–17.0)
Platelets: 125 10*3/uL — ABNORMAL LOW (ref 150–400)
RBC: 4.25 MIL/uL (ref 4.22–5.81)
RBC: 4 MIL/uL — ABNORMAL LOW (ref 4.22–5.81)
RDW: 17.9 % — ABNORMAL HIGH (ref 11.5–15.5)
WBC: 8.5 10*3/uL (ref 4.0–10.5)

## 2011-03-14 LAB — RENAL FUNCTION PANEL
CO2: 24 mEq/L (ref 19–32)
Chloride: 90 mEq/L — ABNORMAL LOW (ref 96–112)
GFR calc Af Amer: 8 mL/min — ABNORMAL LOW (ref >60)
GFR calc non Af Amer: 7 mL/min — ABNORMAL LOW (ref >60)
Potassium: 4.8 mEq/L (ref 3.5–5.1)
Sodium: 137 mEq/L (ref 135–145)

## 2011-03-14 LAB — PROCALCITONIN: Procalcitonin: >175 ng/mL

## 2011-03-14 LAB — APTT: aPTT: 40 seconds — ABNORMAL HIGH (ref 24–37)

## 2011-03-14 LAB — LACTIC ACID, PLASMA: Lactic Acid, Venous: 3.2 mmol/L — ABNORMAL HIGH (ref 0.5–2.2)

## 2011-03-14 LAB — PHOSPHORUS: Phosphorus: 4.6 mg/dL (ref 2.3–4.6)

## 2011-03-14 LAB — CORTISOL: Cortisol, Plasma: 18.7 ug/dL

## 2011-03-14 LAB — MAGNESIUM: Magnesium: 2.6 mg/dL — ABNORMAL HIGH (ref 1.5–2.5)

## 2011-03-14 LAB — CARBOXYHEMOGLOBIN: O2 Saturation: 63.5 %

## 2011-03-14 LAB — TYPE AND SCREEN

## 2011-03-14 LAB — D-DIMER, QUANTITATIVE: D-Dimer, Quant: 5.03 ug/mL-FEU — ABNORMAL HIGH (ref 0.00–0.48)

## 2011-03-14 NOTE — Op Note (Signed)
[name]. [facility]  Patient:    [name], [name]                      MRN: [id number] Proc. Date: 01/20/01 Adm. Date:  [id number] Attending:  Henrene [name] CC:         [name], M.D.   Operative Report  PREOPERATIVE DIAGNOSES: 1. Obstructed chronic ambulatory peritoneal dialysis catheter. 2. Chronic renal failure. 3. History of cardiac disease.  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 2. Chronic renal failure. 3. Episode of atrial tachycardia at the completion of surgery.  OPERATION: 1. Exploratory laparotomy. 2. Removal of chronic ambulatory peritoneal dialysis catheter. 3. Lysis of adhesions. 4. Sigmoid colectomy with low pelvic anastomosis.  SURGEON:  [name]. [name], M.D.  ASSISTANT:  [name]. [name], M.D.  ANESTHESIA:  General  HISTORY: [name] is a 45 year old black male chronic renal failure patient for eight years who more recently has been managed at [facility]. He desires peritoneal dialysis.  He is presently being hemodialyzed.  He has a CAPD catheter in the lower abdomen that has not been used for approximately a month.  The patient stated that he became disenchanted with the doctors in [place]. Previously he had been dialyzed and managed by the local physician and he saw Dr. Charlann [name] approximately two weeks ago who basically accepted back into the [facility] Hemodialysis [facility] that is being managed by local nephrologist.  The patient was referred to our. On examination he had a catheter in the lower abdomen that was not being used and not any definite acute tenderness. I saw him last Wednesday.  He wanted to save the catheter if possible.  I did not have any definite information from the [facility] but recommended that we try to flush his catheter and manipulate it at the x-ray and he was scheduled for this on Monday. On Monday, he presented having undergone  hemodialysis earlier in the day and I flushed the catheter. I saw that it was covered with a sheath.  I did not see any actual frank purulence in the fluid but you could not get enough fluid in to really do a good flush. Attempted to reposition the catheter but it is basically chronically scarred and recommended that we plan on doing this in the operating room.  Whether or not the catheter can be removed and a new one placed would be determined on rather or not (1) what we find, (2) whether any evidence of an active infection could be documented.  The patient presented today as instructed, having undergone hemodialysis this morning.  His white count was 15,900 and his abdomen was slightly more tender in the lower abdomen than he had been when I had saw him on Monday.  He does not really give a definite history of obviously tenderness and obvious infection but yet he was slightly more tenderness than he was 48 hours earlier.  DESCRIPTION OF PROCEDURE:  The patient was taken to the operating room. Induction of general anesthesia with endotracheal tube long tapered to the stomach and then with the muscle relaxed you could feel a definite fullness in the lower midline abdomen that was not appreciated before.  A small incision was made.  This catheter at [facility] had been placed through a little midline incision and you could feel the internal cuff that was really right nicely outside of the fascia and I just dissected to that.  I could not see any frank purulence around the internal cuff but an opening with a sheath that extends from that into the peritoneal cavity.  There was this turbid milky infected looking fluid. I placed a right angle through this so I could sort of open it and distract.  I actually got down into the subfascial layer and then could feel this big inflammatory mass down in the lower abdomen.  I withdrew the catheter and then kind of stripped it of the little bit of fluid  and fibrinous tissue was in it. I sent it for Gram stain and culture.  The Gram stain returned for gram positive cocci and white cells.  I had opened the incision further trying to see if we could break up what really felt like an abscess clinically and really found just this thick inflammatory peel and exudate, of course the catheter goes in on the first line against the cecum and then the small bowel and then kind of going on down into the pelvis. I extended the incision, continued working and by this time I was able to peel this omentum off of this big inflammatory mass.  Then could actually first kind of separate the cecum and noted that we had a fibrinous tract that was going kind of coiling around and was where the catheter had been located but then down low in the pelvis there was a big thickened segment of sigmoid colon. I continued basically enlarging the incision and continued working manipulating. I had called to see if any of my partners were available.  [name] was not immediately available but was able to come in before too long. By this time, I had the sigmoid colon area so you could truly inspect it and there was areas of fat necrosis and kind of like a stricture formation of the colon. You could see tics one down in the lower pelvis area and below this was a marked inflammatory reaction chronic because of the catheter, actually it started with a perforated sigmoid colon from a tic I could not determine.  [name] after he scrubbed in was of the opinion that this segment of the colon definitely needed to be removed, whether we needed to do a colostomy or anastomosis would be determined.  Dr. Samuella [name] actually scrubbed in then and basically assisted for about two hours as we went ahead and freed up the sigmoid colon.  The mesentry was divided between [facility] and ligated with 2-0 silks and then the sigmoid colon and the proximal sigmoid was soft and normal appearing.  The  area removed was probably about 9 inches in length and then we could get really right at the peritoneal reflection.  The colon was back to  normal by palpation etc.  Fortunately the patient was not extremely constipated.  There was a little bit of solid stool within the colon but it was not like there was any loose stool, etc. I elected to go ahead and do a single layer silk anastomosis with 2-0 silk really right at the peritoneal reflection.  It was difficult because of the position of the narrow pelvis and etc. The small bowel was placed back in normal position.  There had not been much blood loss and we then placed the omentum over this under the fascia and then closed the midline with interrupted sutures of 0 Prolene.  The skin was closed with staples and then I made a little incision and removed the external cuff and  that portion of the catheter which was left open.  We basically had completed the surgery at this point, when the anesthetist noted that he had just abruptly started having basically a significant cardiac arrhythmia and then went into an episode of ventricular tachycardia. He was shocked promptly and converted back to a slow normal sinus rhythm with first a block and then a rhythm and was able to maintain his blood pressure.  He was given some Atropine, 1/2 unit of bicarbonate and Lidocaine by the anesthetist and then was breathing spontaneously and moved all extremities.  He was then sent to the recovery room.  His potassium is 6.3 on an i-STAT and we will recheck that in the recovery room.  He does need to the ICU and the question is which antibiotic should he be on.  I think that since this is gram positive organisms and he has been around hospital, chronic catheter, etc that we probably should cover Methicillin-resistant even that we do not really have any definite cultures available at this time, they are pending.  Estimated blood loss was really not much.  It was a  long operation because of the scarring, decision making, etc.  I called Dr. Charlann [name] when I was identifying and he said no he had not had any definite signs of infection but the patient has been admitted about two years ago when he had basically a fever workup with the source never definitely being determined.  They obtained a CT scan of the abdomen that did not show diverticulitis and that was two years ago.  Whether he has had a CT scan of the abdomen since then I am not sure.  The patient is awake and breathing in the recovery room at this time. DD:  01/20/01 TD:  01/21/01 Job: [id number]

## 2011-03-14 NOTE — Discharge Summary (Signed)
[place]  Patient:    [name], [name]                        MRN: [id number] Adm. Date:  01/20/01 Disc. Date: 01/30/01 Attending:  Anselm Pancoast. Zachery Dakins, M.D. CC:         Llana Aliment. Deterding, M.D.   Discharge Summary  DISCHARGE DIAGNOSES: 1. Pelvic abscess secondary to sigmoid diverticulitis and infected common bile    duct catheter. 2. Myocardial infarction immediately postoperative with ventricular    tachycardia.  PROCEDURES: 1. Exploratory laparotomy with removal of common bile duct catheter. 2. Sigmoid colectomy with anastomosis. 3. Cardiac resuscitation intraoperatively.  HISTORY OF PRESENT ILLNESS:  [name] is a 45 year old, African-American male with a long history of chronic renal failure and has been on dialysis for approximately eight years.  He has been on both hemodialysis and peritoneal dialysis and desires to continue with peritoneal dialysis if possible.  The patient lives in [place] and approximately two years ago was managed by the local nephrologist.  He then transferred his care to the [facility] clinic that his in [place].  He now recently desires to resume management with local nephrologist and plans to move back to the [place] area.  The patient has seen Dr. Charlann Boxer in the office.  He has a peritoneal dialysis catheter in the left lower quadrant that his not functioning and the patient stated to Dr. Charlann Boxer that the doctors there were planning to remove it and replace another catheter.  Dr. Charlann Boxer asked me to see the patient in the office.  On physical examination in the office, there was no obvious signs of infection.  He was not tender.  His catheter was actually in the lower quadrant and I saw him over at x-ray the following day and manipulated the catheter, but there was no evidence of any flow and manipulation was slightly painful as it always is. We gave him some Kefzol within the dialysate we were trying  to use.  Since we could not reposition the catheter, I recommended that we proceed with surgical removal and replacement.  Whether we could place a new catheter would be determined at the time of surgery.  Surgery was scheduled for 48 hours later and he was an a.m. admission for this planned procedure having had hemodialysis the preceding day.  The patient has a history of coronary artery disease and has had a previous myocardial infarction and also gives history of basically episodes of passing out, according to his mother.  The patient is on Atenolol 50 mg in the morning, but did not take it the morning of surgery.  HOSPITAL COURSE:  The patient was taken to surgery.  You could feel definitely a fullness in the lower abdomen after induction of general anesthesia.  A small opening was made opening into the peritoneal cavity.  Obviously, infection was going down around the catheter from the large abscess in the pelvis.  I extended the incision so we could do a full laparotomy.  We removed the catheter and then was impressed that there was an area of definite inflammation of the sigmoid colon.  Whether the origin of the infection was related to the catheter or it was from the sigmoid diverticulitis, I could not tell clinically.  This area of colon was so involved that we elected to proceed.  Dr. Samuella Cota scrubbed in and a sigmoid colectomy was performed. Fortunately, the patient was cleaned out enough  that we could go ahead and do an anastomosis, but it was in the lower pelvis with difficult anastomosis. The pathology was sent and the pathologist says this is definitely sigmoid diverticulitis/diverticulosis and still whether this was an origin or whether he has an abscess that was actually around the colon, could not definitely be determined.  As we were completing the surgery, the patient was awakened.  He suddenly went into an episode of ventricular tachycardia.  He was shocked and converted  fairly quickly.  Postoperatively, Dr. Darrick Penna asked the [facility] Cardiologist to see him.  He was having some ventricular arrhythmias postoperatively, but besides a slight low blood pressure, mentally was awake and appeared to suffer no ill effects from the short arrest that did occur in the operating room.  Postoperatively, he had hemodialysis on the day of surgery for correction of his fluid status.  He remained in the SICU for approximately five to six days. He was on Unasyn.  The cultures grew a Staphylococcus aureus.  By postop day #4, he started passing flatus.  He had a cardiac evaluation on April 4, which showed no evidence of ischemia and ejection fraction of 44% and a focal scar on the left septal area.  He basically was started on a liquid diet about postop day #5 and this kind of gradually advanced.  He was continued on both vancomycin and Unasyn and was transferred to the dialysis floor.  His wound appeared to heal without evidence of infection.  We discontinued the Unasyn on postop day #10 and he was ready for discharge on January 30, 2001.  I think that is very unlikely that he will be back on peritoneal dialysis anytime soon.  If we wait six months or so, he certainly could be considered for resuming peritoneal dialysis, but the degree of scarring that he had from the previous catheter and pelvic abscess, would prevent any peritoneal dialysis in this interim.  He is presently being dialyzed with an Ash catheter and I think he is going to the clinic after discharge.  DISCHARGE MEDICATIONS: 1. Cipro 500 mg one tablet per day x 5 days. 2. Tylox for incisional pain. 3. Lopressor 12.5 mg twice a day for blood pressure. 4. Calcium three tablets with meals. DD:  02/16/01 TD:  02/16/01 Job: [id number]

## 2011-03-14 NOTE — H&P (Signed)
[place]  Patient:    [name], [name]                      MRN: [id number] Adm. Date:  [id number] Attending:  Henrene Dodge                         History and Physical  CHIEF COMPLAINT: Desires peritoneal dialysis.  HISTORY OF PRESENT ILLNESS: [name] is a 45 year old black male with a long history of chronic renal failure and has been on dialysis for approximately eight years.  He has been on both hemodialysis and peritoneal dialysis and desires to continue with peritoneal dialysis.  The patient approximately two years ago transferred his dialysis care (he lives in [place], [place]) to the [place] [place] unit, and over the last two years he has been on both.  He had a peritoneal dialysis catheter that was working and this became occluded I think approximately five or six weeks ago and they attempted to manipulate him in the radiologist department and he could not get the catheter to work, and the doctors in [place], [place] advised removing that one and replacing another.  The patient decided to move back to [place], [place] and saw Dr. Charlann Boxer, who had previously been his nephrologist, and Dr. Charlann Boxer contacted me last week and I saw the patient in the office.  At no time have we obviously had signs of infection.  He has, of course, been off and on antibiotics.  He is presently being dialyzed with an Ash catheter in the right subclavian over the internal jugular area.  His original problem was hypertension but he has had a myocardial infarction and also has a stent.  We have obtained records from his most recent catheterization in [place], [place] and Dr. Charlann Boxer is in agreement that we will plan on seeing if we can basically get him back on peritoneal dialysis.  I tried to reposition the catheter on Monday in the radiologist department here and the catheter, which is a coil  catheter in the pelvis, appears to be kind of chronically encased in scar formation, and there was a little bit of discomfort during the procedure but certainly not like that of an obviously infected peritoneal dialysis catheter.  The patient is on hemodialysis on a Monday, Wednesday, and Friday schedule and was dialyzed this morning.  He came directly from hemodialysis for laparotomy for hopefully either trying to reposition this catheter or placing a new catheter.  He is aware that if we find any evidence of an active infection we will not be able to place a new catheter or continue with peritoneal dialysis.  CURRENT MEDICATIONS:  1. Atenolol for blood pressure, 50 mg in the morning, but he does not take     it on the morning of his dialysis.  2. Calcium three with each meal.  3. Darvocet for mild pain.  He denies problems with basically chest discomfort and has had a long-term problem with high blood pressure.  The patient is medically disabled and his closest relative is his mother.  PAST MEDICAL HISTORY: Refer to his old charts, with multiple admissions. About two years ago he was admitted here with basically fevers and they never really improved and, in fact, the problem we thought was probably an infected access problem.  ALLERGIES: No known drug allergies.  PHYSICAL EXAMINATION:  VITAL SIGNS:  Preoperative temperature was 99 degrees, pulse 120, respirations 18, blood pressure 130/81.  Weight 180 pounds.  Height 5 feet 7 inches.  HEENT: He appears adequately hydrated, no acute tenderness or problems.  CHEST: Good breath sounds bilaterally.  Ash catheter in the right internal jugular.  CARDIAC: Sinus tachycardia.  ABDOMEN: He is not acutely tender in the upper abdomen.  He is tender in the lower abdomen and appears to have more of an induration around the catheter going to the lower midline incision than he had two days ago.  RECTAL: Examination not  done.  EXTREMITIES: He has no peripheral dialysis catheter and has multiple incisions, predominantly in his upper arms, from previous vascular access.  CNS: Appears physiologic.  LABORATORY DATA: Chest x-ray shows chronic bronchitis, no recent pneumonia.  Preoperative potassium was 3.9.  WBC was noted to be elevated at 15,900, hematocrit 39%.  IMPRESSION: Chronic renal failure.  Desires continuous ambulatory peritoneal dialysis.  Presently has an obstructed peritoneal dialysis catheter in the lower abdomen.  PLAN: The patient will undergo laparotomy under general anesthesia and we will determine whether to reposition the catheter or remove this catheter, not replace it.  If we find no evidence of any active infection and feel peritoneal dialysis worked the patient hopes we can place a new peritoneal dialysis catheter at the same operation. DD:  01/20/01 TD:  01/21/01 Job: [id number]

## 2011-03-15 ENCOUNTER — Inpatient Hospital Stay (HOSPITAL_COMMUNITY): Payer: Medicare Other

## 2011-03-15 LAB — PREPARE FRESH FROZEN PLASMA

## 2011-03-15 LAB — CARDIAC PANEL(CRET KIN+CKTOT+MB+TROPI)
CK, MB: 3.7 ng/mL (ref 0.3–4.0)
Relative Index: 2.9 — ABNORMAL HIGH (ref 0.0–2.5)
Troponin I: 1.61 ng/mL (ref <0.30)

## 2011-03-15 LAB — RENAL FUNCTION PANEL
Albumin: 3.2 g/dL — ABNORMAL LOW (ref 3.5–5.2)
BUN: 49 mg/dL — ABNORMAL HIGH (ref 6–23)
Creatinine, Ser: 10.51 mg/dL — ABNORMAL HIGH (ref 0.4–1.5)
Glucose, Bld: 129 mg/dL — ABNORMAL HIGH (ref 70–99)
Phosphorus: 7.6 mg/dL — ABNORMAL HIGH (ref 2.3–4.6)

## 2011-03-15 LAB — PROTIME-INR: INR: 2.62 — ABNORMAL HIGH (ref 0.00–1.49)

## 2011-03-15 LAB — APTT: aPTT: 39 seconds — ABNORMAL HIGH (ref 24–37)

## 2011-03-15 LAB — CBC
HCT: 40.9 % (ref 39.0–52.0)
Hemoglobin: 13 g/dL (ref 13.0–17.0)
MCV: 88.1 fL (ref 78.0–100.0)
RBC: 4.64 MIL/uL (ref 4.22–5.81)
WBC: 12.3 10*3/uL — ABNORMAL HIGH (ref 4.0–10.5)

## 2011-03-15 LAB — GLUCOSE, CAPILLARY: Glucose-Capillary: 108 mg/dL — ABNORMAL HIGH (ref 70–99)

## 2011-03-16 ENCOUNTER — Inpatient Hospital Stay (HOSPITAL_COMMUNITY): Payer: Medicare Other

## 2011-03-16 LAB — COMPREHENSIVE METABOLIC PANEL
ALT: 18 U/L (ref 0–53)
AST: 25 U/L (ref 0–37)
Albumin: 3.2 g/dL — ABNORMAL LOW (ref 3.5–5.2)
CO2: 23 mEq/L (ref 19–32)
Calcium: 10.6 mg/dL — ABNORMAL HIGH (ref 8.4–10.5)
Creatinine, Ser: 6.45 mg/dL — ABNORMAL HIGH (ref 0.4–1.5)
GFR calc Af Amer: 11 mL/min — ABNORMAL LOW (ref >60)
Sodium: 137 mEq/L (ref 135–145)
Total Protein: 7.9 g/dL (ref 6.0–8.3)

## 2011-03-16 LAB — GLUCOSE, CAPILLARY
Glucose-Capillary: 105 mg/dL — ABNORMAL HIGH (ref 70–99)
Glucose-Capillary: 174 mg/dL — ABNORMAL HIGH (ref 70–99)
Glucose-Capillary: 137 mg/dL — ABNORMAL HIGH (ref 70–99)

## 2011-03-16 LAB — CBC
Hemoglobin: 12.2 g/dL — ABNORMAL LOW (ref 13.0–17.0)
MCH: 27.7 pg (ref 26.0–34.0)
MCHC: 31.9 g/dL (ref 30.0–36.0)
Platelets: 196 10*3/uL (ref 150–400)
RDW: 17.9 % — ABNORMAL HIGH (ref 11.5–15.5)

## 2011-03-16 LAB — PROCALCITONIN: Procalcitonin: >175 ng/mL

## 2011-03-17 LAB — CATH TIP CULTURE: Culture: 15

## 2011-03-17 LAB — COMPREHENSIVE METABOLIC PANEL
Alkaline Phosphatase: 128 U/L — ABNORMAL HIGH (ref 39–117)
BUN: 63 mg/dL — ABNORMAL HIGH (ref 6–23)
CO2: 23 mEq/L (ref 19–32)
Chloride: 91 mEq/L — ABNORMAL LOW (ref 96–112)
Glucose, Bld: 117 mg/dL — ABNORMAL HIGH (ref 70–99)
Potassium: 5.4 mEq/L — ABNORMAL HIGH (ref 3.5–5.1)
Total Bilirubin: 0.3 mg/dL (ref 0.3–1.2)

## 2011-03-17 LAB — CBC
HCT: 42.7 % (ref 39.0–52.0)
Hemoglobin: 13.7 g/dL (ref 13.0–17.0)
MCH: 28 pg (ref 26.0–34.0)
MCHC: 32.1 g/dL (ref 30.0–36.0)

## 2011-03-17 LAB — GLUCOSE, CAPILLARY
Glucose-Capillary: 120 mg/dL — ABNORMAL HIGH (ref 70–99)
Glucose-Capillary: 99 mg/dL (ref 70–99)
Glucose-Capillary: 160 mg/dL — ABNORMAL HIGH (ref 70–99)
Glucose-Capillary: 136 mg/dL — ABNORMAL HIGH (ref 70–99)

## 2011-03-18 ENCOUNTER — Inpatient Hospital Stay (HOSPITAL_COMMUNITY): Payer: Medicare Other

## 2011-03-18 LAB — GLUCOSE, CAPILLARY
Glucose-Capillary: 94 mg/dL (ref 70–99)
Glucose-Capillary: 120 mg/dL — ABNORMAL HIGH (ref 70–99)
Glucose-Capillary: 154 mg/dL — ABNORMAL HIGH (ref 70–99)

## 2011-03-18 LAB — RENAL FUNCTION PANEL
GFR calc Af Amer: 7 mL/min — ABNORMAL LOW (ref >60)
GFR calc non Af Amer: 6 mL/min — ABNORMAL LOW (ref >60)
Glucose, Bld: 115 mg/dL — ABNORMAL HIGH (ref 70–99)
Phosphorus: 5.5 mg/dL — ABNORMAL HIGH (ref 2.3–4.6)
Potassium: 4.8 mEq/L (ref 3.5–5.1)
Sodium: 132 mEq/L — ABNORMAL LOW (ref 135–145)

## 2011-03-18 LAB — CBC
Hemoglobin: 12.2 g/dL — ABNORMAL LOW (ref 13.0–17.0)
MCH: 28.2 pg (ref 26.0–34.0)
MCV: 86.1 fL (ref 78.0–100.0)
RBC: 4.33 MIL/uL (ref 4.22–5.81)

## 2011-03-18 LAB — PROTIME-INR: Prothrombin Time: 25.1 seconds — ABNORMAL HIGH (ref 11.6–15.2)

## 2011-03-19 LAB — PROTIME-INR
INR: 1.69 — ABNORMAL HIGH (ref 0.00–1.49)
Prothrombin Time: 20.1 seconds — ABNORMAL HIGH (ref 11.6–15.2)

## 2011-03-19 LAB — GLUCOSE, CAPILLARY
Glucose-Capillary: 130 mg/dL — ABNORMAL HIGH (ref 70–99)
Glucose-Capillary: 132 mg/dL — ABNORMAL HIGH (ref 70–99)

## 2011-03-20 ENCOUNTER — Inpatient Hospital Stay (HOSPITAL_COMMUNITY): Payer: Medicare Other

## 2011-03-20 LAB — BASIC METABOLIC PANEL
BUN: 63 mg/dL — ABNORMAL HIGH (ref 6–23)
Creatinine, Ser: 7.93 mg/dL — ABNORMAL HIGH (ref 0.4–1.5)
GFR calc non Af Amer: 7 mL/min — ABNORMAL LOW (ref >60)
Glucose, Bld: 142 mg/dL — ABNORMAL HIGH (ref 70–99)

## 2011-03-20 LAB — CULTURE, BLOOD (ROUTINE X 2)
Culture: NO GROWTH
Culture: NO GROWTH

## 2011-03-20 LAB — PROTIME-INR
INR: 1.61 — ABNORMAL HIGH (ref 0.00–1.49)
Prothrombin Time: 19.3 seconds — ABNORMAL HIGH (ref 11.6–15.2)

## 2011-03-20 LAB — GLUCOSE, CAPILLARY
Glucose-Capillary: 134 mg/dL — ABNORMAL HIGH (ref 70–99)
Glucose-Capillary: 144 mg/dL — ABNORMAL HIGH (ref 70–99)

## 2011-03-20 LAB — CBC
MCHC: 31.8 g/dL (ref 30.0–36.0)
RDW: 17.1 % — ABNORMAL HIGH (ref 11.5–15.5)

## 2011-03-21 LAB — GLUCOSE, CAPILLARY: Glucose-Capillary: 117 mg/dL — ABNORMAL HIGH (ref 70–99)

## 2011-03-25 NOTE — Discharge Summary (Signed)
NAME:  [name], [name] NO.:  [id number]  MEDICAL RECORD NO.:  [id number]           PATIENT TYPE:  I  LOCATION:  6741                         FACILITY:  [facility]  PHYSICIAN:  Garnetta Buddy, M.D.   DATE OF BIRTH:  July 05, 1966  DATE OF ADMISSION:  03/13/2011 DATE OF DISCHARGE:  03/20/2011                              DISCHARGE SUMMARY   ADMISSION DIAGNOSES: 1. Fever secondary to sepsis likely from infected hemodialysis     PermCath, cannot rule out infected hemodialysis graft. 2. Atrial fibrillation. 3. End-stage renal disease, chronic hemodialysis. 4. Anxiety and depression. 5. Antiphospholipid syndrome. 6. History of hypertension, generally requires midodrine treatment,     however, with hypotension. 7. Hyperlipidemia. 8. Secondary hyperparathyroidism. 9. Anemia of chronic disease. 10.Volume overload. 11.History of nephrogenic fibrosing dermopathy, stable.  DISCHARGE DIAGNOSES: 1. Methicillin-sensitive Staphylococcus aureus sepsis related to a     PermCath infection. 2. Septic shock secondary to methicillin-sensitive Staphylococcus     aureus sepsis. 3. End-stage renal disease, chronic hemodialysis. 4. Secondary hyperparathyroidism with mild hypercalcemia. 5. Anemia of chronic disease. 6. Atrial fibrillation, stable ventricular rate at discharge, on     Coumadin. 7. History of antiphospholipid syndrome, on Coumadin. 8. History of depression. 9. History of nephrogenic fibrosing dermopathy, stable.  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  Having rigors, noticed the patient had a left femoral AV Gore-Tex graft, which was cannulated for the first time without difficulty, mildly swollen however, and fluid overload  because he has missed treatment.  Initially, the patient had refused to have his dialysis catheter, dressing change because of leg pain, but he was decided to done this once dressing was removed.  He had copious purulent drainage from around the catheter exit site.  Blood cultures x2 were drawn from the [facility].  He was dosed with vancomycin, [name], and arrangements were made to be transfer to [facility] for further evaluation and treatment, hopefully remove the dialysis catheter.  However, the patient is on Coumadin which was the latest.  He was therapeutic on Coumadin introduced the previous week.  It should be noted the patient had an admission on January 29, 2011 with septic shock thought to be due to infected leg graft, it was later terminated from tunneled hemodialysis catheter, which was removed. Infectious Disease was consulted and they discharged with Cipro.  He is on vancomycin and continued through January 29, 2011.  The patient had negative echocardiogram and negative TEE on February 08, 2011 as an outpatient.  Noted he had missed hemodialysis treatments on February 09, 2011 due to diarrhea.  PHYSICAL EXAMINATION:  VITAL SIGNS:  On presentation of the hospital exam by [name], and [name] showed the patient's temperature is 99.9, blood pressure is 130/70, pulse of 128, respirations 28. GENERAL APPEARANCE:  Acutely ill, lying flat with rigors. HEENT:  Normocephalic.  Nares:  Without drainage.  Mouth:  Mucous  membranes moist. NECK:  Supple. CARDIAC:  Tachycardic with regular. LUNGS:  Grossly clear to auscultation anteriorly. SKIN:  Hard and dry. ABDOMEN:  Soft, nontender, nondistended.  No rebound.  No guarding. Positive bowel sounds. EXTREMITIES:  Revealed trace 1+ lower extremity edema bilaterally.  Feet was not good.  No skin breakdown.  Left thigh graft blood flow rate was at 400 venous pressure, 240 on hemodialysis.  Right femoral  catheter copious purulent draining with a dirty dressing intact.  Skin darkened proximally to the catheter site. NEUROLOGIC:  Alert and oriented x3.  No focal deficits noted.  Lab data was pending.  [name] admitted the patient because of his septic appearance thought to be due to his hemodialysis PermCath. Blood [facility]7 [facility] and he will be given vancomycin and Fortaz and culture of the exit site.  Follow up a chest x- ray.  For this history of atrial fibrillation, on Coumadin, we will follow up INRs, hold his Coumadin because PermCath need to be pulled out, resume the Lovenox.  Follow up labs and continue Tuesday, Thursday, and Saturday hemodialysis treatment as needed.  Noted, the patient was hypertensive, could be related to missing hemodialysis.  Generally required midodrine treatment as an outpatient.  His pressure was 175/93. At the Dialysis Unit, this was thought to be related to his 7 kg over his dry weight because he had missed hemodialysis because he had nausea, vomiting, and fevers as an outpatient.  COURSE IN HOSPITAL:  Problem #1.  Methicillin-sensitive Staphylococcus aureus sepsis secondary to PermCath infection:  The patient required admission to MICU his as pressure bottom down within 24 hours of admission dropping to 77/47 on the 18th.  Noted his Coumadin was held and [name] was able to pull out his femoral Diatek without difficulty and noted the cuff was not incorporated on the 18th.  He was sent for culture sensitivity and came back growing methicillin-sensitive Staphylococcus aureus as were also the positive blood cultures from [facility] grew out methicillin-sensitive Staphylococcus aureus.  He was treated appropriately with vancomycin and Fortaz.  Until cultures came back, [name] was stopped.  Noted that he has reported Ancef allergy with rash and thus he was continued on vancomycin in the hospital.   Pressure was stabilized and he was moved to 6700.  We will continue vancomycin for 2 weeks total therapy with recommendation as noted by pharmacy continue as 1 gram q. Hemodialysis stopping on Mar 27, 2011. Problem #2.  End-stage renal disease:  The patient will be continued on Tuesday, Thursday, and Saturdays scheduled in [facility].  There was some mild problems using his femoral AV Gore-Tex graft.  [facility] was consulted.  Dr. Darrick Penna saw the patient on 22nd, noted the patient did have a difficult access placement secondary to contracture.  He was recommended to start using his medial limb of the graft for now.  Until problems resolved, using the other side.  If there is continued problems, may need to have surgery to make a graft more superficial.  I planed await until current infectious issues have resolved to avoid risk of infecting the graft.  The patient had hemodialysis on Mar 20, 2011 using the medial side of the graft without difficulty.  Blood flow rates of 400 and dialysis flow rate of 800 without difficulty.  We will continue this process as an outpatient.  Noted, his outpatient dry weight was 79 kg and he will be 78 kg at time of discharge.  Problem #3.  Secondary hyperparathyroidism with mild hypercalcemia: Noted, the patient did have elevated calcium 11, phosphorous 35.5, albumin of 3.2.  Before discharge, his Zemplar which at not 10 mcg q. hemodialysis was stopped.  He will continue Renagel as his phosphate binder, follow up calcium phosphorus as an outpatient.  Restart vitamin D when his calcium is returned to normal and his followup is intact, PTH intact. Problem #4.  Anemia:  Hemoglobin was 12.6, stable throughout hospital stay, did not recur any Epogen or iron. Problem #5.  Atrial fibrillation.  The patient remained in atrial fibrillation with a controlled ventricular rate, Coumadin was continued as per pharmacy protocol.  He had an INR of 1.61 on day of  discharge, which for this patient consistent with his normal range.  As an outpatient, he has been chronically noncompliant.  We will continue, however, Coumadin and stress the need for him to be compliant.  Follow up INR next week at the Dialysis Unit on Tuesday, the 29th. Problem #6.  Antiphospholipid syndrome during hospital stay:  There is no problems with clotting factors and again, we will continue on Coumadin and check INR on the 29th as noted above.  Again, the patient was advised to be adherent to taking his Coumadin as instructed. Problem #7.  Hypotension during course in hospital.  Noted to the unit because of the septic shock and low blood pressures.  He was stabilized on this with his blood pressure returning.  He was placed on his midodrine 10 mg daily and his pressure was 104/70, which is about standard for him on day of discharge as noted by Dr. Hyman Hopes. Problem #8.  Anxiety and depression:  The patient continued on his paroxetine 10 mg daily and also p.r.n. Ativan and he takes Ambien at night p.r.n.  On Mar 20, 2011, the patient seen by Dr. Hyman Hopes deemed ready for discharge home.  He is tolerating meals, pressure was stable, using his femoral AV Gore-Tex graft without difficulty.  Continue his vancomycin for his MSSA sepsis, stopping on Mar 27, 2011 two weeks total therapy.  MEDICATIONS AT THE TIME OF DISCHARGE: 1. Percocet 5/325 one to two q.4 h. p.r.n. #30 no refills. 2. Vancomycin 1 g q. Hemodialysis, stopped on Mar 27, 2011. 3. Ambien 5 mg nightly p.r.n. 4. Aspirin 81 mg daily. 5. Benadryl 25 mg two tablets twice daily as needed. 6. Famotidine 20 mg nightly. 7. Hydroxyzine 25 mg q.8 h. p.r.n. 8. Migraine medication Aspirin/APAP caffeine q.4 h. p.r.n. 9. EMLA cream p.r.n. graph 1 hour prior to dialysis. 10.Lorazepam 2 mg b.i.d. p.r.n. 11.Metamucil two tablets at bedtime. 12.Midodrine 10 mg daily. 13.Paroxetine 10 mg daily. 14.Renvela three tablets with meals two  times a day. 15.Rena-Vite daily. 16.Simvastatin 40 mg daily. 17.Sorbitol p.r.n. 18.Vitamin A over-the-counter q. Bedtime. 19.Vitamin B12 q. bedtime. 20.Vitamin E over-the-counter q. Bedtime. 21.Warfarin 5 mg daily. 22.Patanol eye drops b.i.d. p.r.n.  DIET:  Renal failure diet, limit total fluids to 4 cups total each day.  FOLLOWUP:  Follow up with [facility] on his Tuesday, Thursday, Saturday schedule.     Emett Pore, P.A.   ______________________________ Garnetta Buddy, M.D.    DWZ/MEDQ  D:  03/20/2011  T:  03/21/2011  Job:  [id number]  cc:   [facility]  Electronically Signed by Lenny Pastel P.A. on 03/24/2011 10:39:52 AM Electronically Signed by Elvis Coil M.D. on 03/25/2011 11:33:39 AM

## 2011-03-27 NOTE — H&P (Signed)
NAME:  Richard, [name] NO.:  [id number]  MEDICAL RECORD NO.:  [id number]           PATIENT TYPE:  I  LOCATION:  6732                         FACILITY:  [facility]  PHYSICIAN:  Terrial Rhodes, M.D.DATE OF BIRTH:  September 10, 1966  DATE OF ADMISSION:  03/13/2011                             HISTORY & PHYSICAL   ADMITTING PHYSICIAN:  Terrial Rhodes, MD  CHIEF COMPLAINT:  Fever with purulent drainage from femoral dialysis catheter.  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  He was discharged from [facility] in March of this year after an admission for septic shock which was initially thought to have been due to infected thigh graft but was later determined to be secondary from a tunneled dialysis catheter which was removed.  The patient had an infectious disease consult and treatment at discharge consisted of Cipro, metronidazole, and vancomycin which were to continue through January 29, 2011.  The patient had a negative echocardiogram during his hospitalization and had a negative TEE by Dr. Eden Emms on April 14 as an outpatient.  The patient recently has missed his dialysis treatments May 8 and again on Tuesday, the 15th.  He states he admitted he missed dialysis on 15th due to diarrhea.  On questioning today, he said he had onset of fever and chills with nausea, vomiting, diarrhea on Monday, May 14.  Today he presented to dialysis with fever, temperature of 99.9, chills, and with the onset of dialysis began to have rigors.  The patient's left thigh graft was cannulated for the first time without difficulty.  It was moderately swollen, however, the patient was markedly fluid overloaded as well.  Initially the patient refused to have his dialysis catheter dressing change due to leg pain, however, he was  persuaded to allow this to be done and once the dressing was removed, he had copious purulent drainage around the catheter exit site.  The patient had blood cultures x2 done at his Dialysis Center in [place].  He was dosed with vancomycin and [name] and arrangements were made for him to be transported to [facility] for further evaluation and treatment and hopefully removal of his dialysis catheter.  He is on Coumadin which may delay this.  He had a therapeutic Coumadin level in the 2s last week.  PAST MEDICAL HISTORY:  As above plus history of hypertension, now using midodrine predialysis, atrial fibrillation on chronic Coumadin, antiphospholipid antibody syndrome on chronic Coumadin, anemia, blindness, anxiety, depression, insomnia, gastroesophageal reflux disease, multiple vascular access problems, and infections, history of partial small bowel obstruction in 2011, secondary hyperparathyroidism, medical and dialysis noncompliance, hyperlipidemia, left lower lobe pneumonia in 2010, history of CAPD with removal of dialysis catheter in 2002, sigmoid colectomy with low pelvic anastomosis secondary to chronic pelvic abscess with sigmoid diverticulitis 2002 Dr. Zachery Dakins and nephrogenic fibrosing dermopathy biopsy-proven 2003.  SOCIAL HISTORY:  The patient lives in apartment by himself in [place].  He does not smoke or use alcohol.  He is legally blind.  FAMILY  HISTORY:  Positive for hypertension.  CURRENT MEDICATIONS: 1. Coumadin 7.5 mg on Sunday, 5 mg all other days 2. Paxil 10 mg per day started late April for anxiety and relieved by     Ativan. Current Ativan dose is 2 mg b.i.d. p.r.n. 3. Rena-Vite 1 daily. 4. Midodrine 10 mg predialysis. 5. Sorbitol p.r.n. 6. Renagel 800 mg 3 tablets with meals 3 times a day. 7. Simvastatin 40 mg per day 8. Atarax prn 9. Ambien prn 10.Last discharge med list showed vitamin B12 and E, it is unclear if     he takes these  regularly.  ALLERGIES:  The patient has no known drug allergies.  DIALYSIS PRESCRIPTION:  Four and a half hours Tuesday, Thursday, Saturday at [place].  ESTIMATED DRY WEIGHT:  79 kg.  ACCESS:  Right femoral catheter, left thigh graft.  Epogen 36000 units IV every dialysis, Zemplar 10 mcg IV every dialysis, standard heparin 2K, 2.5Ca bath, profile 2 400/800, and variable sodium 148 linear.  REVIEW OF SYMPTOMS:  GENERAL:  Positive for fever, chills, diminished appetite and fatigue.  HEENT:  Chronic blindness.  SKIN:  No new noted rashes or lesions.  CARDIOPULMONARY:  No chest pain.  Positive shortness of breath.  Noted lower extremity swelling.  Positive for cough, chronic heat and chronic cold intolerance.  The patient does not make urine.  He has generalized weakness and anxiety.  He notes right thigh pain, nausea, vomiting, diarrhea.  This started Monday.  PHYSICAL EXAMINATION DONE DURING HIS DIALYSIS TREATMENT:  VITAL SIGNS: Temperature 99.9, blood pressure 130/70 with a pulse of 128, respirations 28.  His treatment sheet noted that his heart rate was 151 at 12:50. came down to 96 at 1330 GEN: acutely ill gentleman with rigors. HEENT:  Normocephalic.  Nares without drainage.  Mouth:  Mucosal membranes moist.  Glossitis. NECK:  Supple. HEART:  Tachycardic but regular. LUNGS:  Grossly clear to auscultation anteriorly. SKIN:  Hot and dry. ABDOMEN:  Soft, nontender, nondistended.  No rebound, no guarding. Positive bowel sounds. EXTREMITIES:  Trace to 1+ lower extremity edema bilaterally.  Feet in good repair.  No skin breakdown.  Left thigh graft, blood flow rate 400, venous pressure 240.  Right femoral catheter, copious purulent draining, dressing is dirty. SKIN:  Darkened proximally to the catheter exit site. NEURO:  Alert and oriented x3, irritable.  Moves all extremities well  ASSESSMENT/PLAN: 1. Fever likely secondary to sepsis from infected catheter, although      cannot rule out infected graft at this time.  Blood cultures x2     have been drawn.  Vancomycin and Fortaz to be given.  He will have     culturing of his catheter exit site at [facility] due to faster     turn around.  We will also check chest x-ray. 2. Atrial fibrillation by history.  He is not on any medications for     this other than Coumadin.  We will check an INR once he arrives at     [facility] and hold his Coumadin until his catheter is out and then     resume Lovenox.  His tachycardia now is most likely due to fever. 3. End-stage renal disease.  He dialyzes Tuesday, Thursday, Saturday     in [place] with his usual dialysis prescription.  He may need     his dry weight lowered. 4. Anxiety, depression.  Continue Paxil and Ativan. 5. Antiphospholipid syndrome.  Hold Coumadin until catheter out and  then start Lovenox and transition to Coumadin. 6. History of hypertension.  The patient is hypertensive today.     Generally requires midodrine pretreatment.  His blood pressure     predialysis was 175/93 most likely due to his volume status as he     is over 7 kg dry weight which is very unlikely for him.  Parameters     have been written for holding midodrine. 7. Hyperlipidemia.  Continue simvastatin. 8. Secondary hyperparathyroidism.  Continue usual binders and Zemplar     dose. 9. Anemia.  Continue usual Epogen dose and monitor hemoglobin levels. 10.Volume overload.  He will have a chest x-ray done today to see if     there is any pulmonary component.  There may be generalized edema     and as mentioned before dry weight will be reassessed 11.History of nephrogenic fibrosing dermopathy stable.     [name], P.A.C.   ______________________________ Terrial Rhodes, M.D.    MB/MEDQ  D:  03/13/2011  T:  03/14/2011  Job:  [id number]  cc:   Terrial Rhodes, M.D. Di Kindle. Edilia Bo, M.D.  Electronically Signed by [name] P.A. on 03/17/2011 10:27:46  AM Electronically Signed by Terrial Rhodes M.D. on 03/27/2011 07:44:08 AM

## 2011-05-20 ENCOUNTER — Other Ambulatory Visit (HOSPITAL_COMMUNITY): Payer: Self-pay | Admitting: Nephrology

## 2011-05-20 DIAGNOSIS — N186 End stage renal disease: Secondary | ICD-10-CM

## 2011-05-23 ENCOUNTER — Ambulatory Visit (HOSPITAL_COMMUNITY): Admission: RE | Admit: 2011-05-23 | Payer: Medicare Other | Source: Ambulatory Visit

## 2011-05-30 ENCOUNTER — Ambulatory Visit (HOSPITAL_COMMUNITY)
Admission: RE | Admit: 2011-05-30 | Discharge: 2011-05-30 | Disposition: A | Discharge status: Home / Self Care | Payer: Medicare Other | Source: Ambulatory Visit | Attending: Nephrology | Admitting: Nephrology

## 2011-05-30 DIAGNOSIS — N186 End stage renal disease: Secondary | ICD-10-CM

## 2011-05-30 DIAGNOSIS — N2581 Secondary hyperparathyroidism of renal origin: Secondary | ICD-10-CM | POA: Insufficient documentation

## 2011-05-30 DIAGNOSIS — K219 Gastro-esophageal reflux disease without esophagitis: Secondary | ICD-10-CM | POA: Insufficient documentation

## 2011-05-30 DIAGNOSIS — E785 Hyperlipidemia, unspecified: Secondary | ICD-10-CM | POA: Insufficient documentation

## 2011-05-30 DIAGNOSIS — Z0389 Encounter for observation for other suspected diseases and conditions ruled out: Secondary | ICD-10-CM | POA: Insufficient documentation

## 2011-05-30 DIAGNOSIS — I2510 Atherosclerotic heart disease of native coronary artery without angina pectoris: Secondary | ICD-10-CM | POA: Insufficient documentation

## 2011-05-30 DIAGNOSIS — D649 Anemia, unspecified: Secondary | ICD-10-CM | POA: Insufficient documentation

## 2011-05-30 DIAGNOSIS — H548 Legal blindness, as defined in USA: Secondary | ICD-10-CM | POA: Insufficient documentation

## 2011-05-30 DIAGNOSIS — I120 Hypertensive chronic kidney disease with stage 5 chronic kidney disease or end stage renal disease: Secondary | ICD-10-CM | POA: Insufficient documentation

## 2011-05-30 MED ORDER — IOHEXOL 300 MG/ML  SOLN
100.0000 mL | Freq: Once | INTRAMUSCULAR | Status: AC | PRN
  Administered 2011-05-30: 68 mL via INTRAVENOUS

## 2011-06-22 ENCOUNTER — Other Ambulatory Visit (HOSPITAL_COMMUNITY): Payer: Self-pay | Admitting: Nephrology

## 2011-06-22 DIAGNOSIS — N19 Unspecified kidney failure: Secondary | ICD-10-CM

## 2011-06-23 ENCOUNTER — Ambulatory Visit (HOSPITAL_COMMUNITY)
Admission: RE | Admit: 2011-06-23 | Discharge: 2011-06-23 | Disposition: A | Discharge status: Home / Self Care | Payer: Medicare Other | Source: Ambulatory Visit | Attending: Nephrology | Admitting: Nephrology

## 2011-06-23 ENCOUNTER — Other Ambulatory Visit (HOSPITAL_COMMUNITY): Payer: Self-pay | Admitting: Nephrology

## 2011-06-23 DIAGNOSIS — I4891 Unspecified atrial fibrillation: Secondary | ICD-10-CM | POA: Insufficient documentation

## 2011-06-23 DIAGNOSIS — N19 Unspecified kidney failure: Secondary | ICD-10-CM

## 2011-06-23 DIAGNOSIS — I120 Hypertensive chronic kidney disease with stage 5 chronic kidney disease or end stage renal disease: Secondary | ICD-10-CM | POA: Insufficient documentation

## 2011-06-23 DIAGNOSIS — T82898A Other specified complication of vascular prosthetic devices, implants and grafts, initial encounter: Secondary | ICD-10-CM | POA: Insufficient documentation

## 2011-06-23 DIAGNOSIS — N186 End stage renal disease: Secondary | ICD-10-CM | POA: Insufficient documentation

## 2011-06-23 DIAGNOSIS — Y849 Medical procedure, unspecified as the cause of abnormal reaction of the patient, or of later complication, without mention of misadventure at the time of the procedure: Secondary | ICD-10-CM | POA: Insufficient documentation

## 2011-06-23 MED ORDER — IOHEXOL 300 MG/ML  SOLN
100.0000 mL | Freq: Once | INTRAMUSCULAR | Status: AC | PRN
  Administered 2011-06-23: 40 mL via INTRAVENOUS

## 2011-07-08 ENCOUNTER — Emergency Department (HOSPITAL_COMMUNITY)
Admission: EM | Admit: 2011-07-08 | Discharge: 2011-07-08 | Disposition: A | Discharge status: Home / Self Care | Payer: Medicare Other | Attending: Emergency Medicine | Admitting: Emergency Medicine

## 2011-07-08 DIAGNOSIS — N186 End stage renal disease: Secondary | ICD-10-CM | POA: Insufficient documentation

## 2011-07-08 DIAGNOSIS — Z992 Dependence on renal dialysis: Secondary | ICD-10-CM | POA: Insufficient documentation

## 2011-07-08 DIAGNOSIS — H543 Unqualified visual loss, both eyes: Secondary | ICD-10-CM | POA: Insufficient documentation

## 2011-07-08 DIAGNOSIS — Z09 Encounter for follow-up examination after completed treatment for conditions other than malignant neoplasm: Secondary | ICD-10-CM | POA: Insufficient documentation

## 2011-07-08 DIAGNOSIS — I4891 Unspecified atrial fibrillation: Secondary | ICD-10-CM | POA: Insufficient documentation

## 2011-07-08 DIAGNOSIS — I120 Hypertensive chronic kidney disease with stage 5 chronic kidney disease or end stage renal disease: Secondary | ICD-10-CM | POA: Insufficient documentation

## 2011-07-08 DIAGNOSIS — Y841 Kidney dialysis as the cause of abnormal reaction of the patient, or of later complication, without mention of misadventure at the time of the procedure: Secondary | ICD-10-CM | POA: Insufficient documentation

## 2011-07-08 DIAGNOSIS — T8140XA Infection following a procedure, unspecified, initial encounter: Secondary | ICD-10-CM | POA: Insufficient documentation

## 2011-11-18 ENCOUNTER — Other Ambulatory Visit: Payer: Self-pay | Admitting: Nephrology

## 2011-11-18 LAB — PROTIME-INR
INR: 1.2
Prothrombin Time: 15.5 secs — ABNORMAL HIGH (ref 11.5–14.7)

## 2012-01-06 ENCOUNTER — Other Ambulatory Visit (HOSPITAL_COMMUNITY): Payer: Self-pay | Admitting: Nephrology

## 2012-01-06 DIAGNOSIS — N186 End stage renal disease: Secondary | ICD-10-CM

## 2012-01-08 ENCOUNTER — Other Ambulatory Visit (HOSPITAL_COMMUNITY): Payer: Self-pay | Admitting: Nephrology

## 2012-01-08 DIAGNOSIS — N186 End stage renal disease: Secondary | ICD-10-CM

## 2012-01-09 ENCOUNTER — Ambulatory Visit (HOSPITAL_COMMUNITY): Admission: RE | Admit: 2012-01-09 | Payer: Medicare Other | Source: Ambulatory Visit

## 2012-01-12 IMAGING — US IR [PERSON_NAME]/EXT/UNI*L*
1 series · 2 of 2 positions shown · non-contrast
Comparison: none

CLINICAL DATA: End-stage renal disease, access planning for a left
femoral AV graft

[Series 1: ir (person_name)/ext/uni*left* · 2 of 2 slices shown]
[im 1/2]
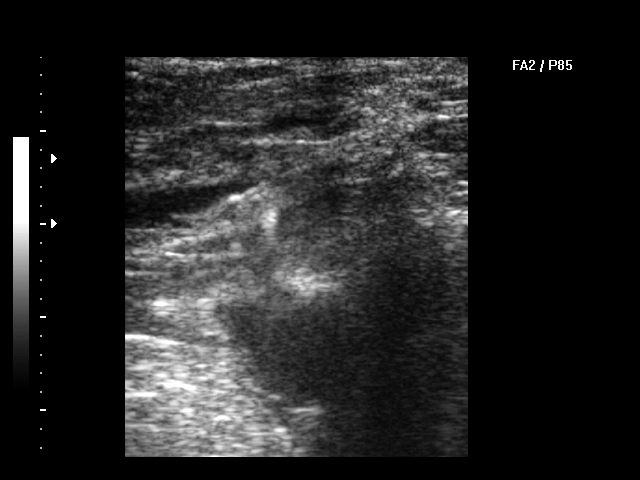
[im 2/2]
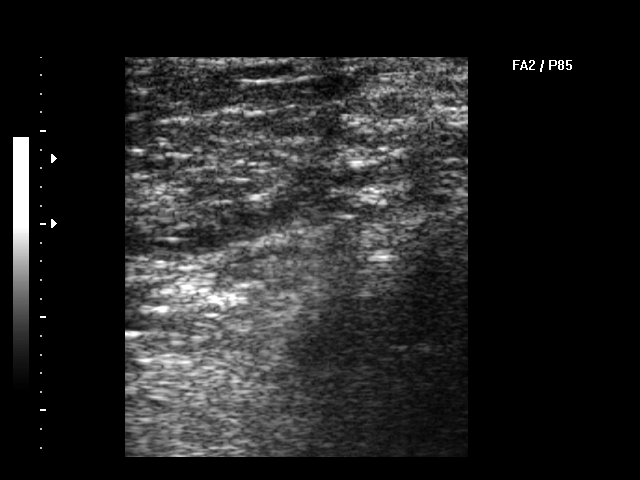

[2 of 2 positions shown; findings below may reference images not displayed]

ULTRASOUND GUIDANCE VASCULAR ACCESS
LEFT LOWER EXTREMITY FEMORAL VENOGRAM INCLUDING THE IVC

Date:  04/19/2010 [DATE]

Radiologist:  Jhemp Paias, M.D.

Medications:  1% lidocaine locally

Guidance:  Ultrasound fluoroscopic

Fluoroscopy time:  0.4 minutes

Sedation time:  None.

Contrast volume:  66 ml Umnipaque-XSS

Complications:  No immediate

PROCEDURE/FINDINGS:

Informed consent was obtained from the patient following
explanation of the procedure, risks, benefits and alternatives.
The patient understands, agrees and consents for the procedure.
All questions were addressed.  A time out was performed.

Maximal barrier sterile technique utilized including caps, mask,
sterile gowns, sterile gloves, large sterile drape, hand hygiene,
and betadine

Under sterile conditions and local anesthesia, left femoral
micropuncture venous access was performed with ultrasound.  Images
obtained documentation.  Guide wire advanced followed by a
transitional dilator.  This allowed exchange for an Amplatz guide
wire followed by 4-French sheath.  Contrast injection performed for
a left lower extremity venogram including IVC in preparation for a
left femoral AV graft.

Left lower extremity venogram:  The left common femoral, external
iliac, and common iliac veins are patent.  No iliac stenosis or
occlusion.  IVC is widely patent.  No central stenosis or
occlusion.  Right femoral dialysis catheter tip terminates in the
IVC just below the right atrium.

Anatomy appears favorable for a left femoral AV graft.
IMPRESSION: Patent left femoral and iliac veins as well as the IVC.  No
peripheral or central stenosis.

## 2012-01-14 ENCOUNTER — Ambulatory Visit (HOSPITAL_COMMUNITY): Admission: RE | Admit: 2012-01-14 | Payer: Medicare Other | Source: Ambulatory Visit

## 2012-01-21 ENCOUNTER — Other Ambulatory Visit (HOSPITAL_COMMUNITY): Payer: Self-pay | Admitting: Nephrology

## 2012-01-21 DIAGNOSIS — N186 End stage renal disease: Secondary | ICD-10-CM

## 2012-01-28 ENCOUNTER — Ambulatory Visit (HOSPITAL_COMMUNITY): Admission: RE | Admit: 2012-01-28 | Payer: Medicare Other | Source: Ambulatory Visit

## 2012-01-29 ENCOUNTER — Telehealth (HOSPITAL_COMMUNITY): Payer: Self-pay

## 2012-03-12 ENCOUNTER — Other Ambulatory Visit (HOSPITAL_COMMUNITY): Payer: Self-pay | Admitting: Nephrology

## 2012-03-12 DIAGNOSIS — N186 End stage renal disease: Secondary | ICD-10-CM

## 2012-03-19 ENCOUNTER — Other Ambulatory Visit (HOSPITAL_COMMUNITY): Payer: Self-pay | Admitting: Nephrology

## 2012-03-19 ENCOUNTER — Ambulatory Visit (HOSPITAL_COMMUNITY)
Admission: RE | Admit: 2012-03-19 | Discharge: 2012-03-19 | Disposition: A | Discharge status: Home / Self Care | Payer: Medicare Other | Source: Ambulatory Visit | Attending: Nephrology | Admitting: Nephrology

## 2012-03-19 DIAGNOSIS — K219 Gastro-esophageal reflux disease without esophagitis: Secondary | ICD-10-CM | POA: Insufficient documentation

## 2012-03-19 DIAGNOSIS — I120 Hypertensive chronic kidney disease with stage 5 chronic kidney disease or end stage renal disease: Secondary | ICD-10-CM | POA: Insufficient documentation

## 2012-03-19 DIAGNOSIS — Y849 Medical procedure, unspecified as the cause of abnormal reaction of the patient, or of later complication, without mention of misadventure at the time of the procedure: Secondary | ICD-10-CM | POA: Insufficient documentation

## 2012-03-19 DIAGNOSIS — T82898A Other specified complication of vascular prosthetic devices, implants and grafts, initial encounter: Secondary | ICD-10-CM | POA: Insufficient documentation

## 2012-03-19 DIAGNOSIS — I2510 Atherosclerotic heart disease of native coronary artery without angina pectoris: Secondary | ICD-10-CM | POA: Insufficient documentation

## 2012-03-19 DIAGNOSIS — N186 End stage renal disease: Secondary | ICD-10-CM

## 2012-03-19 DIAGNOSIS — Z992 Dependence on renal dialysis: Secondary | ICD-10-CM | POA: Insufficient documentation

## 2012-03-19 DIAGNOSIS — D6859 Other primary thrombophilia: Secondary | ICD-10-CM | POA: Insufficient documentation

## 2012-03-19 DIAGNOSIS — H548 Legal blindness, as defined in USA: Secondary | ICD-10-CM | POA: Insufficient documentation

## 2012-03-19 MED ORDER — MIDAZOLAM HCL 2 MG/2ML IJ SOLN
INTRAMUSCULAR | Status: AC
  Filled 2012-03-19: qty 4

## 2012-03-19 MED ORDER — FENTANYL CITRATE 0.05 MG/ML IJ SOLN
INTRAMUSCULAR | Status: AC | PRN
  Administered 2012-03-19 (×3): 50 ug via INTRAVENOUS

## 2012-03-19 MED ORDER — IOHEXOL 300 MG/ML  SOLN: 100.0000 mL | Freq: Once | INTRAMUSCULAR | Status: AC | PRN

## 2012-03-19 MED ORDER — FENTANYL CITRATE 0.05 MG/ML IJ SOLN
INTRAMUSCULAR | Status: AC
  Filled 2012-03-19: qty 4

## 2012-03-19 MED ORDER — MIDAZOLAM HCL 5 MG/5ML IJ SOLN
INTRAMUSCULAR | Status: AC | PRN
  Administered 2012-03-19 (×3): 1 mg via INTRAVENOUS

## 2012-03-19 NOTE — Procedures (Signed)
Venous PTA

## 2012-03-19 NOTE — H&P (Signed)
[name] is an 46 y.o. male.   Chief Complaint: left leg dialysis graft stenosis HPI: Patient with ESRD and stenotic left leg AVGG presents today for angioplasty/possible stenting of venous stenosis.  PMH: HTN, afib, antiphospholipid syndrome, legally blind, secondary hyperparathyroidism, CAD with stents,diverticulitis, GERD, hyperlipidemia, anemia PSH: vascular access procedures, renal transplant No family history on file. Social History:  does not have a smoking history on file. He does not have any smokeless tobacco history on file. His alcohol and drug histories not on file.  Allergies:  No current outpatient prescriptions on file. Current facility-administered medications:fentaNYL (SUBLIMAZE) 0.05 MG/ML injection, , , , ;  midazolam (VERSED) 2 MG/2ML injection, , , ,  Medications: coumadin, lorazepam, ambien, midodrine, atarax, simvastatin, vicodin, pepcid, renavite, sorbitol, asa, pantanol  No results found for this or any previous visit (from the past 48 hour(s)). No results found.  Review of Systems  Constitutional: Negative for fever and chills.  Eyes:       Legally blind  Respiratory: Negative for cough and shortness of breath.   Cardiovascular: Negative for chest pain.  Gastrointestinal: Negative for nausea, vomiting and abdominal pain.  Musculoskeletal: Positive for back pain.  Neurological: Positive for headaches.  Endo/Heme/Allergies: Does not bruise/bleed easily.   Filed Vitals:   03/19/12 1340  BP: 137/67  SpO2: 98%    Physical Exam  Constitutional: He is oriented to person, place, and time. He appears well-developed and well-nourished.  Cardiovascular: Normal rate and regular rhythm.   Respiratory: Effort normal and breath sounds normal.  GI: Soft. Bowel sounds are normal. There is no tenderness.  Musculoskeletal: Normal range of motion. He exhibits no edema.  Neurological: He is alert and oriented to person, place, and time.      Assessment/Plan Patient with ESRD and venous stenosis of left leg AVGG; plan is for angioplasty/possible stenting of stenosis. Details of above d/w pt with his understanding and consent.  Trason Shifflet,D KEVIN 03/19/2012, 1:25 PM

## 2012-03-20 ENCOUNTER — Other Ambulatory Visit (HOSPITAL_COMMUNITY): Payer: Self-pay | Admitting: Interventional Radiology

## 2012-03-20 ENCOUNTER — Encounter (HOSPITAL_COMMUNITY): Payer: Self-pay

## 2012-03-20 ENCOUNTER — Ambulatory Visit (HOSPITAL_COMMUNITY)
Admission: RE | Admit: 2012-03-20 | Discharge: 2012-03-20 | Disposition: A | Discharge status: Home / Self Care | Payer: Medicare Other | Source: Ambulatory Visit | Attending: Interventional Radiology | Admitting: Interventional Radiology

## 2012-03-20 VITALS — BP 96/52 | HR 80 | Resp 14

## 2012-03-20 DIAGNOSIS — I2510 Atherosclerotic heart disease of native coronary artery without angina pectoris: Secondary | ICD-10-CM | POA: Insufficient documentation

## 2012-03-20 DIAGNOSIS — Y849 Medical procedure, unspecified as the cause of abnormal reaction of the patient, or of later complication, without mention of misadventure at the time of the procedure: Secondary | ICD-10-CM | POA: Insufficient documentation

## 2012-03-20 DIAGNOSIS — Z992 Dependence on renal dialysis: Secondary | ICD-10-CM | POA: Insufficient documentation

## 2012-03-20 DIAGNOSIS — N2581 Secondary hyperparathyroidism of renal origin: Secondary | ICD-10-CM | POA: Insufficient documentation

## 2012-03-20 DIAGNOSIS — E785 Hyperlipidemia, unspecified: Secondary | ICD-10-CM | POA: Insufficient documentation

## 2012-03-20 DIAGNOSIS — T82898A Other specified complication of vascular prosthetic devices, implants and grafts, initial encounter: Secondary | ICD-10-CM | POA: Insufficient documentation

## 2012-03-20 DIAGNOSIS — I8290 Acute embolism and thrombosis of unspecified vein: Secondary | ICD-10-CM

## 2012-03-20 DIAGNOSIS — I120 Hypertensive chronic kidney disease with stage 5 chronic kidney disease or end stage renal disease: Secondary | ICD-10-CM | POA: Insufficient documentation

## 2012-03-20 DIAGNOSIS — I4891 Unspecified atrial fibrillation: Secondary | ICD-10-CM | POA: Insufficient documentation

## 2012-03-20 DIAGNOSIS — N186 End stage renal disease: Secondary | ICD-10-CM | POA: Insufficient documentation

## 2012-03-20 DIAGNOSIS — K219 Gastro-esophageal reflux disease without esophagitis: Secondary | ICD-10-CM | POA: Insufficient documentation

## 2012-03-20 DIAGNOSIS — H548 Legal blindness, as defined in USA: Secondary | ICD-10-CM | POA: Insufficient documentation

## 2012-03-20 DIAGNOSIS — D6859 Other primary thrombophilia: Secondary | ICD-10-CM | POA: Insufficient documentation

## 2012-03-20 HISTORY — DX: Major depressive disorder, single episode, unspecified: F32.9

## 2012-03-20 HISTORY — DX: Cardiac arrhythmia, unspecified: I49.9

## 2012-03-20 HISTORY — DX: Gastro-esophageal reflux disease without esophagitis: K21.9

## 2012-03-20 HISTORY — DX: Anxiety disorder, unspecified: F41.9

## 2012-03-20 HISTORY — DX: Chronic kidney disease, unspecified: N18.9

## 2012-03-20 HISTORY — DX: Pneumonia, unspecified organism: J18.9

## 2012-03-20 HISTORY — DX: Depression, unspecified: F32.A

## 2012-03-20 HISTORY — DX: Essential (primary) hypertension: I10

## 2012-03-20 HISTORY — DX: Unspecified visual loss: H54.7

## 2012-03-20 MED ORDER — FENTANYL CITRATE 0.05 MG/ML IJ SOLN
INTRAMUSCULAR | Status: AC
  Administered 2012-03-20: 100 ug
  Filled 2012-03-20: qty 4

## 2012-03-20 MED ORDER — IOHEXOL 300 MG/ML  SOLN
100.0000 mL | Freq: Once | INTRAMUSCULAR | Status: AC | PRN
  Administered 2012-03-20: 60 mL via INTRAVENOUS

## 2012-03-20 MED ORDER — FENTANYL CITRATE 0.05 MG/ML IJ SOLN
INTRAMUSCULAR | Status: DC | PRN
  Administered 2012-03-20: 50 ug via INTRAVENOUS

## 2012-03-20 MED ORDER — ALTEPLASE 100 MG IV SOLR
2.0000 mg | INTRAVENOUS | Status: AC
  Administered 2012-03-20: 2 mg
  Filled 2012-03-20: qty 2

## 2012-03-20 MED ORDER — MIDAZOLAM HCL 5 MG/5ML IJ SOLN
INTRAMUSCULAR | Status: DC | PRN
  Administered 2012-03-20: 1 mg via INTRAVENOUS

## 2012-03-20 MED ORDER — HEPARIN SODIUM (PORCINE) 1000 UNIT/ML IJ SOLN
INTRAMUSCULAR | Status: AC
  Administered 2012-03-20: 3000 [IU]
  Filled 2012-03-20: qty 1

## 2012-03-20 MED ORDER — MIDAZOLAM HCL 2 MG/2ML IJ SOLN
INTRAMUSCULAR | Status: AC
  Administered 2012-03-20: 2 mg
  Filled 2012-03-20: qty 6

## 2012-03-20 NOTE — Procedures (Signed)
L thigh declot

## 2012-03-20 NOTE — H&P (Signed)
[name] is an 46 y.o. male.   Chief Complaint: left leg dialysis graft stenosis HPI: Patient with ESRD and stenotic left leg AVGG presents today for angioplasty/possible stenting of venous stenosis. He was here yesterday and had declot procedure with venous PTA. The graft has apparently now rethrombosed.  PMH: HTN, afib, antiphospholipid syndrome, legally blind, secondary hyperparathyroidism, CAD with stents,diverticulitis, GERD, hyperlipidemia, anemia  PSH: vascular access procedures, renal transplant  No family history on file.  Social History:  does not have a smoking history on file. He does not have any smokeless tobacco history on file. His alcohol and drug histories not on file.  Allergies: NKA  Meds:  coumadin, lorazepam, ambien, midodrine, atarax, simvastatin, vicodin, pepcid, renavite, sorbitol, asa, pantanol  Reports: 03/19/2012  *RADIOLOGY REPORT*  Clinical Data: Decreased access flow  AV SHUNTOGRAM :  Procedure: The left thigh was prepped and draped in a sterile fashion.  An 18 gauge Angiocath was inserted into the AV graft. Contrast was injected.  No complication.  Findings: Recurrent severe venous anastomotic stenosis.  Otherwise patent left thigh AV graft circuit.  IMPRESSION: Recurrent severe venous anastomotic stenosis.  VENOUS PTA  Procedure:  The 18 gauge Angiocath was exchanged over a Bentson for a 6 [place] sheath. The venous anastomosis was dilated to 6 mm. Repeat imaging was performed. The sheath was removed and hemostasis was achieved with a 0 Prolene figure-of-eight stitch.  No complications.  Findings: Postangioplasty imaging demonstrates wide patency of the venous anastomosis.  IMPRESSION: Successful dilatation of the venous anastomosis to 6 mm.  Original Report Authenticated By: Donavan Burnet, M.D.   Ir Shuntogram/ Fistulagram Left Mod Sed  03/19/2012  *RADIOLOGY REPORT*  Clinical Data: Decreased access flow  AV SHUNTOGRAM :  Procedure: The left thigh was  prepped and draped in a sterile fashion.  An 18 gauge Angiocath was inserted into the AV graft. Contrast was injected.  No complication.  Findings: Recurrent severe venous anastomotic stenosis.  Otherwise patent left thigh AV graft circuit.  IMPRESSION: Recurrent severe venous anastomotic stenosis.  VENOUS PTA  Procedure:  The 18 gauge Angiocath was exchanged over a Bentson for a 6 [place] sheath. The venous anastomosis was dilated to 6 mm. Repeat imaging was performed. The sheath was removed and hemostasis was achieved with a 0 Prolene figure-of-eight stitch.  No complications.  Findings: Postangioplasty imaging demonstrates wide patency of the venous anastomosis.  IMPRESSION: Successful dilatation of the venous anastomosis to 6 mm.  Original Report Authenticated By: Donavan Burnet, M.D.    Review of Systems  Constitutional: Negative for fever and chills.  Eyes:       Legally blind  Respiratory: Negative for cough and shortness of breath.   Cardiovascular: Negative for chest pain.  Gastrointestinal: Negative for nausea, vomiting and abdominal pain.  Musculoskeletal: Positive for back pain.  Neurological: Positive for headaches.  Endo/Heme/Allergies: Does not bruise/bleed easily.   VS: Temp-97.9 BP-109/66 HR-76 O2-99% room air   Physical Exam  Constitutional: He is oriented to person, place, and time. He appears well-developed and well-nourished.  Cardiovascular: Normal rate and regular rhythm.   Respiratory: Effort normal and breath sounds normal.  GI: Soft. Bowel sounds are normal. There is no tenderness.  Musculoskeletal: Normal range of motion. He exhibits no edema.  Neurological: He is alert and oriented to person, place, and time.     Assessment/Plan Patient with ESRD and venous stenosis/thrombosis of left leg AVGG; plan is for thrombolysis, angioplasty/possible stenting of stenosis. Details of above  d/w pt with his understanding and consent.  Brayton El PA-C 03/20/2012,  11:08 AM

## 2012-03-20 NOTE — Discharge Instructions (Signed)
Moderate Sedation, Adult Moderate sedation is given to help you relax or even sleep through a procedure. You may remain sleepy, be clumsy, or have poor balance for several hours following this procedure. Arrange for a responsible adult, family member, or friend to take you home. A responsible adult should stay with you for at least 24 hours or until the medicines have worn off.  Do not participate in any activities where you could become injured for the next 24 hours, or until you feel normal again. Do not:   Drive.   Swim.   Ride a bicycle.   Operate heavy machinery.   Cook.   Use power tools.   Climb ladders.   Work at heights.   Do not make important decisions or sign legal documents until you are improved.   Vomiting may occur if you eat too soon. When you can drink without vomiting, try water, juice, or soup. Try solid foods if you feel little or no nausea.   Only take over-the-counter or prescription medications for pain, discomfort, or fever as directed by your caregiver.If pain medications have been prescribed for you, ask your caregiver how soon it is safe to take them.   Make sure you and your family fully understands everything about the medication given to you. Make sure you understand what side effects may occur.   You should not drink alcohol, take sleeping pills, or medications that cause drowsiness for at least 24 hours.   If you smoke, do not smoke alone.   If you are feeling better, you may resume normal activities 24 hours after receiving sedation.   Keep all appointments as scheduled. Follow all instructions.   Ask questions if you do not understand.  SEEK MEDICAL CARE IF:   Your skin is pale or bluish in color.   You continue to feel sick to your stomach (nauseous) or throw up (vomit).   Your pain is getting worse and not helped by medication.   You have bleeding or swelling.   You are still sleepy or feeling clumsy after 24 hours.  SEEK IMMEDIATE  MEDICAL CARE IF:   You develop a rash.   You have difficulty breathing.   You develop any type of allergic problem.   You have a fever.  Document Released: 07/08/2001 Document Revised: 10/02/2011 Document Reviewed: 11/29/2007 Exit[employer] Patient Information 2012 [employer]. 

## 2012-04-13 ENCOUNTER — Ambulatory Visit: Payer: Medicare Other | Admitting: Vascular Surgery

## 2012-04-14 ENCOUNTER — Encounter: Payer: Self-pay | Admitting: Vascular Surgery

## 2012-04-15 ENCOUNTER — Encounter: Payer: Self-pay | Admitting: Vascular Surgery

## 2012-04-15 ENCOUNTER — Ambulatory Visit (INDEPENDENT_AMBULATORY_CARE_PROVIDER_SITE_OTHER): Payer: Medicare Other | Admitting: Vascular Surgery

## 2012-04-15 VITALS — BP 119/74 | HR 97 | Resp 16 | Ht 67.0 in | Wt 185.2 lb

## 2012-04-15 DIAGNOSIS — N186 End stage renal disease: Secondary | ICD-10-CM

## 2012-04-15 NOTE — Progress Notes (Signed)
Patient is a 46 year old male who recently had a thrombectomy of his left thigh graft in interventional radiology. He has now had skin breakdown at the puncture site. He denies any fever or chills. He is currently dialyzing without difficulty. He has had some bloody drainage from the area of erosion. 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 He denies chest pain.  Physical exam: Filed Vitals:   04/15/12 1558  BP: 119/74  Pulse: 97  Resp: 16  Height: 5\' 7"  (1.702 m)  Weight: 185 lb 3 oz (84 kg)   Left thigh: Palpable thrill in left thigh AV graft 2.5 cm superficial erosion with serous drainage lateral aspect of left thigh graft no obvious graft exposed  Assessment: Skin breakdown or erosion left thigh graft at risk for bleeding  Plan: We will stop his Coumadin today. He will need revision with a new interposition segment around the area of erosion. This is scheduled for my partner Dr. Imogene Burn on Monday, June 24.  Fabienne Bruns, MD [facility] Office: [phone] Pager: [phone]

## 2012-04-16 ENCOUNTER — Encounter (HOSPITAL_COMMUNITY): Payer: Self-pay | Admitting: Pharmacy Technician

## 2012-04-16 ENCOUNTER — Other Ambulatory Visit: Payer: Self-pay

## 2012-04-16 ENCOUNTER — Encounter (HOSPITAL_COMMUNITY): Payer: Self-pay | Admitting: *Deleted

## 2012-04-16 NOTE — Pre-Procedure Instructions (Signed)
20 [name]  04/16/2012   Your procedure is scheduled on:  June 24  Report to [facility] at 0530 AM.  Call this number if you have problems the morning of surgery: [phone]   Remember:   Do not eat food or drink:After Midnight.  Take these medicines the morning of surgery with A SIP OF WATER: pepcid, ativan   Do not wear jewelry, make-up or nail polish.  Do not wear lotions, powders, or perfumes. You may wear deodorant.  Do not shave 48 hours prior to surgery. Men may shave face and neck.  Do not bring valuables to the hospital.  Contacts, dentures or bridgework may not be worn into surgery.  Leave suitcase in the car. After surgery it may be brought to your room.  For patients admitted to the hospital, checkout time is 11:00 AM the day of discharge.   Patients discharged the day of surgery will not be allowed to drive home.

## 2012-04-18 MED ORDER — SODIUM CHLORIDE 0.9 % IV SOLN: INTRAVENOUS | Status: DC

## 2012-04-18 MED ORDER — CEFAZOLIN SODIUM-DEXTROSE 2-3 GM-% IV SOLR
2.0000 g | INTRAVENOUS | Status: AC
  Administered 2012-04-19: 2 g via INTRAVENOUS
  Filled 2012-04-18: qty 50

## 2012-04-19 ENCOUNTER — Encounter (HOSPITAL_COMMUNITY): Payer: Self-pay | Admitting: Anesthesiology

## 2012-04-19 ENCOUNTER — Telehealth: Payer: Self-pay | Admitting: Vascular Surgery

## 2012-04-19 ENCOUNTER — Ambulatory Visit (HOSPITAL_COMMUNITY): Payer: Medicare Other | Admitting: Anesthesiology

## 2012-04-19 ENCOUNTER — Ambulatory Visit (HOSPITAL_COMMUNITY): Payer: Medicare Other

## 2012-04-19 ENCOUNTER — Ambulatory Visit (HOSPITAL_COMMUNITY)
Admission: RE | Admit: 2012-04-19 | Discharge: 2012-04-19 | Disposition: A | Discharge status: Home / Self Care | Payer: Medicare Other | Source: Ambulatory Visit | Attending: Vascular Surgery | Admitting: Vascular Surgery

## 2012-04-19 ENCOUNTER — Encounter (HOSPITAL_COMMUNITY): Payer: Self-pay | Admitting: Surgery

## 2012-04-19 ENCOUNTER — Encounter (HOSPITAL_COMMUNITY)
Admission: RE | Disposition: A | Discharge status: Home / Self Care | Payer: Self-pay | Source: Ambulatory Visit | Attending: Vascular Surgery

## 2012-04-19 DIAGNOSIS — N186 End stage renal disease: Secondary | ICD-10-CM | POA: Insufficient documentation

## 2012-04-19 DIAGNOSIS — I252 Old myocardial infarction: Secondary | ICD-10-CM | POA: Insufficient documentation

## 2012-04-19 DIAGNOSIS — Z992 Dependence on renal dialysis: Secondary | ICD-10-CM | POA: Insufficient documentation

## 2012-04-19 DIAGNOSIS — T82898A Other specified complication of vascular prosthetic devices, implants and grafts, initial encounter: Secondary | ICD-10-CM | POA: Insufficient documentation

## 2012-04-19 DIAGNOSIS — K219 Gastro-esophageal reflux disease without esophagitis: Secondary | ICD-10-CM | POA: Insufficient documentation

## 2012-04-19 DIAGNOSIS — Y849 Medical procedure, unspecified as the cause of abnormal reaction of the patient, or of later complication, without mention of misadventure at the time of the procedure: Secondary | ICD-10-CM | POA: Insufficient documentation

## 2012-04-19 DIAGNOSIS — H543 Unqualified visual loss, both eyes: Secondary | ICD-10-CM | POA: Insufficient documentation

## 2012-04-19 DIAGNOSIS — I120 Hypertensive chronic kidney disease with stage 5 chronic kidney disease or end stage renal disease: Secondary | ICD-10-CM | POA: Insufficient documentation

## 2012-04-19 HISTORY — DX: Cerebral infarction, unspecified: I63.9

## 2012-04-19 HISTORY — PX: SP DECLOT AVGG: HXRAD28

## 2012-04-19 HISTORY — DX: Other complications of anesthesia, initial encounter: T88.59XA

## 2012-04-19 HISTORY — DX: Personal history of other medical treatment: Z92.89

## 2012-04-19 HISTORY — DX: Acute myocardial infarction, unspecified: I21.9

## 2012-04-19 HISTORY — DX: Adverse effect of unspecified anesthetic, initial encounter: T41.45XA

## 2012-04-19 LAB — POCT I-STAT 4, (NA,K, GLUC, HGB,HCT)
Glucose, Bld: 85 mg/dL (ref 70–99)
HCT: 39 % (ref 39.0–52.0)
Hemoglobin: 13.3 g/dL (ref 13.0–17.0)
Sodium: 137 mEq/L (ref 135–145)

## 2012-04-19 LAB — SURGICAL PCR SCREEN
MRSA, PCR: NEGATIVE
Staphylococcus aureus: NEGATIVE

## 2012-04-19 SURGERY — REVISION OF ARTERIOVENOUS GORETEX GRAFT
Anesthesia: General | Site: Leg Upper | Laterality: Left | Wound class: Clean

## 2012-04-19 MED ORDER — 0.9 % SODIUM CHLORIDE (POUR BTL) OPTIME
TOPICAL | Status: DC | PRN
  Administered 2012-04-19: 1000 mL

## 2012-04-19 MED ORDER — PHENYLEPHRINE HCL 10 MG/ML IJ SOLN
INTRAMUSCULAR | Status: DC | PRN
  Administered 2012-04-19 (×5): 100 ug via INTRAVENOUS

## 2012-04-19 MED ORDER — THROMBIN 20000 UNITS EX KIT
PACK | CUTANEOUS | Status: DC | PRN
  Administered 2012-04-19: 09:00:00 via TOPICAL

## 2012-04-19 MED ORDER — GLYCOPYRROLATE 0.2 MG/ML IJ SOLN
INTRAMUSCULAR | Status: DC | PRN
  Administered 2012-04-19: .5 mg via INTRAVENOUS

## 2012-04-19 MED ORDER — PROPOFOL 10 MG/ML IV EMUL
INTRAVENOUS | Status: DC | PRN
  Administered 2012-04-19: 30 mg via INTRAVENOUS
  Administered 2012-04-19: 120 mg via INTRAVENOUS

## 2012-04-19 MED ORDER — MIDAZOLAM HCL 5 MG/5ML IJ SOLN
INTRAMUSCULAR | Status: DC | PRN
  Administered 2012-04-19: 2 mg via INTRAVENOUS

## 2012-04-19 MED ORDER — DEXTROSE 5 % IV SOLN
INTRAVENOUS | Status: DC | PRN
  Administered 2012-04-19: 08:00:00 via INTRAVENOUS

## 2012-04-19 MED ORDER — LIDOCAINE HCL 4 % MT SOLN
OROMUCOSAL | Status: DC | PRN
  Administered 2012-04-19: 4 mL via TOPICAL

## 2012-04-19 MED ORDER — NEOSTIGMINE METHYLSULFATE 1 MG/ML IJ SOLN
INTRAMUSCULAR | Status: DC | PRN
  Administered 2012-04-19: 4 mg via INTRAVENOUS

## 2012-04-19 MED ORDER — SODIUM CHLORIDE 0.9 % IR SOLN
Status: DC | PRN
  Administered 2012-04-19: 09:00:00

## 2012-04-19 MED ORDER — ROCURONIUM BROMIDE 100 MG/10ML IV SOLN
INTRAVENOUS | Status: DC | PRN
  Administered 2012-04-19: 20 mg via INTRAVENOUS
  Administered 2012-04-19: 50 mg via INTRAVENOUS

## 2012-04-19 MED ORDER — LIDOCAINE-EPINEPHRINE (PF) 1 %-1:200000 IJ SOLN
INTRAMUSCULAR | Status: DC | PRN
  Administered 2012-04-19: 17 mL

## 2012-04-19 MED ORDER — SODIUM CHLORIDE 0.9 % IV SOLN
INTRAVENOUS | Status: DC | PRN
  Administered 2012-04-19 (×2): via INTRAVENOUS

## 2012-04-19 MED ORDER — HEPARIN SODIUM (PORCINE) 1000 UNIT/ML IJ SOLN
INTRAMUSCULAR | Status: DC | PRN
  Administered 2012-04-19: 7000 [IU] via INTRAVENOUS

## 2012-04-19 MED ORDER — OXYCODONE-ACETAMINOPHEN 5-325 MG PO TABS: 1.0000 | ORAL_TABLET | ORAL | Status: AC | PRN

## 2012-04-19 MED ORDER — LIDOCAINE HCL (CARDIAC) 20 MG/ML IV SOLN
INTRAVENOUS | Status: DC | PRN
  Administered 2012-04-19: 50 mg via INTRAVENOUS

## 2012-04-19 MED ORDER — THROMBIN 20000 UNITS EX SOLR
CUTANEOUS | Status: DC | PRN
  Administered 2012-04-19: 20000 [IU] via TOPICAL

## 2012-04-19 MED ORDER — ONDANSETRON HCL 4 MG/2ML IJ SOLN: 4.0000 mg | Freq: Once | INTRAMUSCULAR | Status: DC | PRN

## 2012-04-19 MED ORDER — MUPIROCIN 2 % EX OINT
TOPICAL_OINTMENT | CUTANEOUS | Status: AC
  Administered 2012-04-19: 1 via NASAL
  Filled 2012-04-19: qty 22

## 2012-04-19 MED ORDER — HYDROMORPHONE HCL PF 1 MG/ML IJ SOLN
0.2500 mg | INTRAMUSCULAR | Status: DC | PRN
  Administered 2012-04-19 (×2): 0.5 mg via INTRAVENOUS

## 2012-04-19 MED ORDER — THROMBIN 20000 UNITS EX SOLR
CUTANEOUS | Status: AC
  Filled 2012-04-19: qty 20000

## 2012-04-19 MED ORDER — HYDROMORPHONE HCL PF 1 MG/ML IJ SOLN
INTRAMUSCULAR | Status: AC
  Filled 2012-04-19: qty 1

## 2012-04-19 MED ORDER — LIDOCAINE-EPINEPHRINE (PF) 1 %-1:200000 IJ SOLN
INTRAMUSCULAR | Status: AC
  Filled 2012-04-19: qty 10

## 2012-04-19 MED ORDER — FENTANYL CITRATE 0.05 MG/ML IJ SOLN
INTRAMUSCULAR | Status: DC | PRN
  Administered 2012-04-19: 100 ug via INTRAVENOUS
  Administered 2012-04-19: 150 ug via INTRAVENOUS

## 2012-04-19 MED ORDER — ACETAMINOPHEN 10 MG/ML IV SOLN: 1000.0000 mg | Freq: Once | INTRAVENOUS | Status: DC | PRN

## 2012-04-19 MED ORDER — ONDANSETRON HCL 4 MG/2ML IJ SOLN
INTRAMUSCULAR | Status: DC | PRN
  Administered 2012-04-19: 4 mg via INTRAVENOUS

## 2012-04-19 SURGICAL SUPPLY — 47 items
ADH SKN CLS APL DERMABOND .7 (GAUZE/BANDAGES/DRESSINGS) ×2
CANISTER SUCTION 2500CC (MISCELLANEOUS) ×2 IMPLANT
CATH EMB 4FR 40CM (CATHETERS) ×2 IMPLANT
CLIP TI MEDIUM 6 (CLIP) ×2 IMPLANT
CLIP TI WIDE RED SMALL 6 (CLIP) ×2 IMPLANT
CLOTH BEACON ORANGE TIMEOUT ST (SAFETY) ×2 IMPLANT
COVER SURGICAL LIGHT HANDLE (MISCELLANEOUS) ×4 IMPLANT
DECANTER SPIKE VIAL GLASS SM (MISCELLANEOUS) ×1 IMPLANT
DERMABOND ADVANCED (GAUZE/BANDAGES/DRESSINGS) ×2
DERMABOND ADVANCED .7 DNX12 (GAUZE/BANDAGES/DRESSINGS) ×1 IMPLANT
ELECT REM PT RETURN 9FT ADLT (ELECTROSURGICAL) ×2
ELECTRODE REM PT RTRN 9FT ADLT (ELECTROSURGICAL) ×1 IMPLANT
GLOVE BIO SURGEON STRL SZ 6.5 (GLOVE) ×2 IMPLANT
GLOVE BIO SURGEON STRL SZ7 (GLOVE) ×2 IMPLANT
GLOVE BIOGEL PI IND STRL 6.5 (GLOVE) ×2 IMPLANT
GLOVE BIOGEL PI IND STRL 7.0 (GLOVE) IMPLANT
GLOVE BIOGEL PI IND STRL 7.5 (GLOVE) ×2 IMPLANT
GLOVE BIOGEL PI INDICATOR 6.5 (GLOVE) ×2
GLOVE BIOGEL PI INDICATOR 7.0 (GLOVE) ×1
GLOVE BIOGEL PI INDICATOR 7.5 (GLOVE) ×2
GLOVE SURG SS PI 6.0 STRL IVOR (GLOVE) ×1 IMPLANT
GLOVE SURG SS PI 7.5 STRL IVOR (GLOVE) ×1 IMPLANT
GOWN PREVENTION PLUS XLARGE (GOWN DISPOSABLE) ×1 IMPLANT
GOWN STRL NON-REIN LRG LVL3 (GOWN DISPOSABLE) ×4 IMPLANT
GRAFT GORETEX 6X10 (Vascular Products) ×1 IMPLANT
INSERT FOGARTY SM (MISCELLANEOUS) ×2 IMPLANT
KIT BASIN OR (CUSTOM PROCEDURE TRAY) ×2 IMPLANT
KIT ROOM TURNOVER OR (KITS) ×2 IMPLANT
NS IRRIG 1000ML POUR BTL (IV SOLUTION) ×2 IMPLANT
PACK CV ACCESS (CUSTOM PROCEDURE TRAY) ×2 IMPLANT
PAD ARMBOARD 7.5X6 YLW CONV (MISCELLANEOUS) ×4 IMPLANT
SPONGE GAUZE 4X4 12PLY (GAUZE/BANDAGES/DRESSINGS) ×2 IMPLANT
SPONGE SURGIFOAM ABS GEL 100 (HEMOSTASIS) IMPLANT
SUT ETHILON 3 0 PS 1 (SUTURE) ×2 IMPLANT
SUT GORETEX CV-5THC-13 36IN (SUTURE) ×4 IMPLANT
SUT MNCRL AB 4-0 PS2 18 (SUTURE) ×2 IMPLANT
SUT PROLENE 6 0 BV (SUTURE) ×4 IMPLANT
SUT PROLENE 7 0 BV 1 (SUTURE) IMPLANT
SUT VIC AB 3-0 SH 27 (SUTURE) ×4
SUT VIC AB 3-0 SH 27X BRD (SUTURE) ×2 IMPLANT
SWAB COLLECTION DEVICE MRSA (MISCELLANEOUS) ×1 IMPLANT
TAPE CLOTH SURG 4X10 WHT LF (GAUZE/BANDAGES/DRESSINGS) ×2 IMPLANT
TOWEL OR 17X24 6PK STRL BLUE (TOWEL DISPOSABLE) ×2 IMPLANT
TOWEL OR 17X26 10 PK STRL BLUE (TOWEL DISPOSABLE) ×2 IMPLANT
TUBE ANAEROBIC SPECIMEN COL (MISCELLANEOUS) ×1 IMPLANT
UNDERPAD 30X30 INCONTINENT (UNDERPADS AND DIAPERS) ×2 IMPLANT
WATER STERILE IRR 1000ML POUR (IV SOLUTION) ×2 IMPLANT

## 2012-04-19 NOTE — Telephone Encounter (Signed)
Message copied by Fredrich Birks on Mon Apr 19, 2012  1:32 PM ------      Message from: Melene Plan      Created: Mon Apr 19, 2012 10:34 AM                   ----- Message -----         From: Mar[place]e [place], [place]         Sent: 04/19/2012  10:03 AM           To: Melene Plan, RN            2 week f/u revision thigh graft with stitches - [facility]

## 2012-04-19 NOTE — Telephone Encounter (Signed)
LVM for patient, dpm

## 2012-04-19 NOTE — Anesthesia Preprocedure Evaluation (Addendum)
Anesthesia Evaluation  Patient identified by MRN, date of birth, ID band Patient awake    Reviewed: Allergy & Precautions, H&P , NPO status , Patient's Chart, lab work & pertinent test results  History of Anesthesia Complications Negative for: history of anesthetic complications  Airway Mallampati: II  Neck ROM: Full    Dental  (+) Teeth Intact and Dental Advisory Given   Pulmonary  breath sounds clear to auscultation        Cardiovascular hypertension, Pt. on medications + Past MI + dysrhythmias Atrial Fibrillation Rhythm:Irregular Rate:Normal     Neuro/Psych Anxiety Depression CVA, Residual Symptoms    GI/Hepatic Neg liver ROS, GERD-  Medicated and Controlled,  Endo/Other  negative endocrine ROS  Renal/GU ESRF and DialysisRenal disease     Musculoskeletal  (+) Arthritis -,   Abdominal   Peds  Hematology negative hematology ROS (+)   Anesthesia Other Findings   Reproductive/Obstetrics                        Anesthesia Physical Anesthesia Plan  ASA: III  Anesthesia Plan: General   Post-op Pain Management:    Induction: Intravenous  Airway Management Planned: Oral ETT  Additional Equipment:   Intra-op Plan:   Post-operative Plan: Extubation in OR  Informed Consent: I have reviewed the patients History and Physical, chart, labs and discussed the procedure including the risks, benefits and alternatives for the proposed anesthesia with the patient or authorized representative who has indicated his/her understanding and acceptance.   Dental advisory given  Plan Discussed with: CRNA  Anesthesia Plan Comments: (ESRD Last HD 6/22 K-4.0 Blindness S/P CVA Chronic Afib on coumadin  Plan GA  Kipp Brood, MD  )       Anesthesia Quick Evaluation

## 2012-04-19 NOTE — H&P (Signed)
[facility] [place]  Brief History and Physical  History of Present Illness  [name] is a 46 y.o. male who presents with chief complaint: L thigh AVG puncture complication.  Pt was seen by Dr. Darrick Penna on 04/15/12 and found to have an erosion in the skin adjacent to right thigh AVG cannulation site.  He felt the patient was at risk for bleeding from this site.  The patient presents today for revision of L thigh AVG..    Past Medical History  Diagnosis Date  . Blindness   . Anxiety   . Hypertension   . Depression   . GERD (gastroesophageal reflux disease)   . Pneumonia   . Complication of anesthesia     "Wake up in the middle of procedure."  . Dysrhythmia   . Stroke 2003    Legally Blind  . Chronic kidney disease     Tues, Thurs, Sat  . History of blood transfusion   . Myocardial infarction 2000    no cardiologist-    Past Surgical History  Procedure Date  . Arteriovenous graft placement 11-07-1998    right upper extremity   . Arteriovenous graft placement 10-18-1998    right upper extremity  . Cardiac catheterization     Stents  . Arteriovenous graft placement     Right arm x 2  . Artreriovenous     Graft Right leg  . 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  . Peritoneal catheter insertion   . Peritoneal catheter removal   . Colon resection     due to peritonitis  . Nephrostomy     left    History   Social History  . Marital Status: Single    Spouse Name: N/A    Number of Children: N/A  . Years of Education: N/A   Occupational History  . Not on file.   Social History Main Topics  . Smoking status: Never Smoker   . Smokeless tobacco: Not on file  . Alcohol Use: No  . Drug Use: No  . Sexually Active: Not Currently   Other Topics Concern  . Not on file   Social History Narrative  . No narrative on file    History reviewed. No pertinent family history.  No current facility-administered medications on file  prior to encounter.   Current Outpatient Prescriptions on File Prior to Encounter  Medication Sig Dispense Refill  . b complex-vitamin c-folic acid (NEPHRO-VITE) 0.8 MG TABS Take 0.8 mg by mouth daily.      . famotidine (PEPCID) 20 MG tablet Take 20 mg by mouth daily.      . hydrOXYzine (ATARAX/VISTARIL) 25 MG tablet Take 25 mg by mouth every 6 (six) hours as needed. itching      . midodrine (PROAMATINE) 5 MG tablet Take 10 mg by mouth 3 (three) times a week.       . sevelamer (RENAGEL) 800 MG tablet Take 800 mg by mouth 3 (three) times daily with meals.      . simvastatin (ZOCOR) 40 MG tablet Take 40 mg by mouth every evening.      . sorbitol 70 % solution Take 15 mLs by mouth daily as needed. constipation      . warfarin (COUMADIN) 5 MG tablet Take 5 mg by mouth 3 (three) times a week.       . warfarin (COUMADIN) 7.5 MG tablet Take 7.5 mg by mouth 4 (four) times a  week.       . zolpidem (AMBIEN) 5 MG tablet Take 5 mg by mouth at bedtime as needed. sleep        No Known Allergies  Review of Systems: As listed above, otherwise negative.  Physical Examination  Filed Vitals:   04/19/12 0652  BP: 124/74  Pulse: 90  Temp: 97.6 F (36.4 C)  TempSrc: Oral  Resp: 18  SpO2: 97%    General: A&O x 3, WDWN  Pulmonary: Sym exp, good air movt, CTAB, no rales, rhonchi, & wheezing  Cardiac: RRR, Nl S1, S2, no Murmurs, rubs or gallops  Gastrointestinal: soft, NTND, -G/R, - HSM, - masses, - CVAT B  Musculoskeletal: M/S 5/5 throughout , Extremities without ischemic changes , L thigh AVG with bruit and thrill, contracted limbs, small dime size erosion in skin with some evidence of bleeding, no cellulitic findings  Laboratory See iStat  Medical Decision Making  [name] is a 46 y.o. male who presents with: L thigh AVG erosion vs infection.      The patient is scheduled for: revision of L thigh AVG, possible partial excision of AVG  This pt's NSF makes any surgical intervention  on the left thigh AVG very difficult due to the severely scarred tissue.  The initial placement of the thigh AVG was extremely difficult due his contracture and NSF.  Subsequently a revision of the venous anastomosis is not realistic.    If the L thigh AVG is infected, I would only attempt a partial excision and chronic suppression with antibiotics.  If this is a limited infection or purely a mechanical complication, partial AVG excision and revision maybe possible.  Risk, benefits, and alternatives to access surgery were discussed.  The patient is aware the risks include but are not limited to: bleeding, infection, steal syndrome, nerve damage, ischemic monomelic neuropathy, failure to mature, and need for additional procedures.  The patient is aware of the risks and agrees to proceed.  Leonides Sake, MD [facility] [place] Office: [phone] Pager: [phone]  04/19/2012, 7:11 AM

## 2012-04-19 NOTE — Transfer of Care (Signed)
Immediate Anesthesia Transfer of Care Note  Patient: [name]  Procedure(s) Performed: Procedure(s) (LRB): REVISION OF ARTERIOVENOUS GORETEX GRAFT (Left)  Patient Location: PACU  Anesthesia Type: General  Level of Consciousness: awake, alert  and oriented  Airway & Oxygen Therapy: Patient Spontanous Breathing and Patient connected to nasal cannula oxygen  Post-op Assessment: Report given to PACU RN and Post -op Vital signs reviewed and stable  Post vital signs: Reviewed and stable  Complications: No apparent anesthesia complications

## 2012-04-19 NOTE — Anesthesia Procedure Notes (Signed)
Procedure Name: Intubation Date/Time: 04/19/2012 7:58 AM Performed by: Nicholos Johns Pre-anesthesia Checklist: Patient identified, Emergency Drugs available, Suction available, Patient being monitored and Timeout performed Patient Re-evaluated:Patient Re-evaluated prior to inductionOxygen Delivery Method: Circle system utilized Preoxygenation: Pre-oxygenation with 100% oxygen Intubation Type: IV induction Ventilation: Mask ventilation without difficulty Laryngoscope Size: Mac and 4 Grade View: Grade I Tube type: Oral Tube size: 7.5 mm Number of attempts: 1 Airway Equipment and Method: Stylet Placement Confirmation: ETT inserted through vocal cords under direct vision,  positive ETCO2 and breath sounds checked- equal and bilateral Secured at: 22 cm Tube secured with: Tape Dental Injury: Teeth and Oropharynx as per pre-operative assessment

## 2012-04-19 NOTE — Anesthesia Postprocedure Evaluation (Signed)
  Anesthesia Post-op Note  Patient: [name]  Procedure(s) Performed: Procedure(s) (LRB): REVISION OF ARTERIOVENOUS GORETEX GRAFT (Left)  Patient Location: PACU  Anesthesia Type: General  Level of Consciousness: awake, alert  and oriented  Airway and Oxygen Therapy: Patient Spontanous Breathing and Patient connected to nasal cannula oxygen  Post-op Pain: mild  Post-op Assessment: Post-op Vital signs reviewed and Patient's Cardiovascular Status Stable  Post-op Vital Signs: stable  Complications: No apparent anesthesia complications

## 2012-04-19 NOTE — Preoperative (Signed)
Beta Blockers   Reason not to administer Beta Blockers:Not Applicable 

## 2012-04-19 NOTE — Op Note (Addendum)
OPERATIVE NOTE   PROCEDURE:  Thrombectomy and revision of left thigh arteriovenous graft  PRE-OPERATIVE DIAGNOSIS: Left thigh arteriovenous graft erosion  POST-OPERATIVE DIAGNOSIS: same as above   SURGEON: Leonides Sake, MD  ASSISTANT(S): Della Goo, [facility]  ANESTHESIA: general  ESTIMATED BLOOD LOSS: 50 cc  FINDING(S): 1. Good incorporation of graft 2. Fluid around graft felt likely due to local anesthestic injection  SPECIMEN(S):  Anaerobic and aerobic cultures around graft  INDICATIONS:   [name] is a 46 y.o. male who  presents with erosion over his left thigh arteriovenous graft.  It was felt revision of the thigh arteriovenous graft would be to his advantage due to the risk of possible graft rupture.  Risk, benefits, and alternatives to access surgery were discussed.  The patient is aware the risks include but are not limited to: bleeding, infection, steal syndrome, nerve damage, ischemic monomelic neuropathy, failure to mature, and need for additional procedures.  The patient is aware of the risks and elects to proceed forward.  DESCRIPTION: After full informed written consent was obtained from the patient, the patient was brought back to the operating room and placed supine upon the operating table.  The patient was given IV antibiotics prior to proceeding.  After obtaining adequate sedation, the patient was prepped and draped in standard fashion for a left thigh access procedure.  Under sonosite guidance, I identified the thigh graft proximal and distal to the skin erosion.  I marked on the skin the location of the graft about 5 cm away the erosion.  I injected 20 cc of 1% lidocaine with epinephrine in the two incision sites and over the tunneling route.  I made an incision over the arterial arm of the arteriovenous graft and dissected down to the graft.  I dissected out the graft proximally and distally.   I then made an incision over the venous arm arm of the arteriovenous  graft and dissected down to the graft.  I dissected out the graft proximally and distally.   I tunneled with a Gore tunneler from the distal incision to the proximal incision and passed a 6 mm Goretex graft through the metal tunnel.  I removed the tunnel, leaving the graft in place.  I gave 7000 units of Heparin intravenously, which was a therapeutic bolus.  I then clamped the graft proximally in the arterial arm exposure and clamped the graft distally in the venous arm exposure.  I transected the graft in the arterial arm exposure distally and transected the graft in the venous arm exposure proximally.  I turned my attention to the arterial arm exposure.  I spatulated both grafts to facilitate an end-to-end anastomosis.  These grafts were sewn together in an end-to-end fashion with a running stitch of CV-5.  I then allowed the graft to bleed after removing the proximal clamp on the graft.   The blood flow was attenuated so I passed a 4 Fogarty balloon proximally and obtained some thrombus.  This somewhat concerning as the patient was not clamped until a therapeutic bolus of Heparin had been given and allowed to circulate.  This may suggest that there was already some thrombus in this graft prior to starting this case.    I reclamped the graft distal to the new anastomosis in the arterial arm exposure.  I then turned my attention to the venous arm exposure.  I passed the 4 Fogarty distally and ruptured the balloon.  I repeated this maneuver with another 4 Fogarty and extracted  some clot that did not appear fresh, reinforcing my concerns that this graft was already partial thrombosed.  After a few passes, I got good back bleeding from the venous arm of this graft.  I reclamped the graft.   I spatulated both grafts to facilitate an end-to-end anastomosis.  These grafts were sewn together in an end-to-end fashion with a running stitch of CV-5.  I then allowed the graft to backbleed from both ends by sequentially  releasing clamps.  There was good antegrade bleeding and good retrograde bleeding.  The anastomosis was completed in the usual fashion.  I placed thrombin and gelfoam in all incisions and removed all clamps.  After waiting a few minutes, I washed out all incisions.  There was no further active bleeding.  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  The arterial arm exposure was closed with a running stitch of 3-0  Vicryl.  The skin was closed with mattress stitches of 3-0 Nylon.  The venous arm exposure was closed with interrupted subdermal stitches of 3-0 Vicryl.  The skin was reapproximated with a running stitch of 3-0 Nylon.  The skin was cleaned, dried, and Dermabond used to reinforce the skin closure, given the shallow location between the graft and the skin.  I also marked the new route of the thigh arteriovenous graft in this patient, so hemodialysis can avoid sticking this segment.  There was a weakly palpable thrill at the end of this case.  COMPLICATIONS: none  CONDITION: stable   Shealyn Sean LIANG-YU, MD 04/19/2012 10:07 AM

## 2012-04-19 NOTE — Progress Notes (Signed)
EKG  done on admission(SDW).  Dr. Noreene Larsson notified of abnormal EKG-long QT per Holley Dexter, RN.  Ordered to send on to OR.//L. Steven Veazie,RN

## 2012-04-21 ENCOUNTER — Ambulatory Visit: Payer: Medicare Other | Admitting: Vascular Surgery

## 2012-04-21 LAB — WOUND CULTURE
Culture: NO GROWTH
Gram Stain: NONE SEEN

## 2012-04-24 LAB — ANAEROBIC CULTURE

## 2012-04-29 ENCOUNTER — Other Ambulatory Visit: Payer: Self-pay | Admitting: Nephrology

## 2012-04-29 LAB — POTASSIUM: Potassium: 4.2 mmol/L (ref 3.5–5.1)

## 2012-05-13 ENCOUNTER — Encounter: Payer: Self-pay | Admitting: Vascular Surgery

## 2012-05-14 ENCOUNTER — Encounter: Payer: Self-pay | Admitting: Neurosurgery

## 2012-05-14 ENCOUNTER — Ambulatory Visit: Payer: Medicare Other | Admitting: Vascular Surgery

## 2012-05-17 ENCOUNTER — Ambulatory Visit: Payer: Medicare Other | Admitting: Neurosurgery

## 2012-05-20 ENCOUNTER — Encounter: Payer: Self-pay | Admitting: Neurosurgery

## 2012-05-21 ENCOUNTER — Ambulatory Visit (INDEPENDENT_AMBULATORY_CARE_PROVIDER_SITE_OTHER): Payer: Medicare Other | Admitting: Vascular Surgery

## 2012-05-21 ENCOUNTER — Encounter: Payer: Self-pay | Admitting: Vascular Surgery

## 2012-05-21 VITALS — BP 118/74 | HR 88 | Temp 98.7°F | Ht 67.5 in | Wt 185.2 lb

## 2012-05-21 DIAGNOSIS — N186 End stage renal disease: Secondary | ICD-10-CM

## 2012-05-21 NOTE — Progress Notes (Signed)
[facility] [place]  Postoperative Access Visit  History of Present Illness  HULBERT BRANSCOME is a 46 y.o. year old male who presents for postoperative follow-up for: T&R L thigh AVG (Date: 04/19/12).  The patient's wounds are healed.  The patient notes no steal symptoms.  The patient is able to complete their activities of daily living.  The patient's current symptoms are: none.  Physical Examination  Filed Vitals:   05/21/12 0929  BP: 118/74  Pulse: 88  Temp: 98.7 F (37.1 C)   LLE: Incisions are healed: scab and sutures removed, skin has healed in, skin feels warm, faintly palpable thrill, bruit can be auscultated   Medical Decision Making  ELEAZAR KIMMEY is a 46 y.o. year old male who presents s/p T&R L thigh AVG.  I removed the previous sutures.  The patient has successful healed his wounds without any evidence of cellulitis.    I remarked the path of the L thigh AVG to facilitate cannulation for the dialysis center.  The patient is going to need percutaneous interventions on the venous stenosis in the future.  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  Thank you for allowing [place] to participate in this patient's care.  Leonides Sake, MD [facility] [place] Office: [phone] Pager: [phone]

## 2012-05-24 ENCOUNTER — Ambulatory Visit: Admit: 2012-05-24 | Payer: Self-pay | Admitting: Vascular Surgery

## 2012-05-24 ENCOUNTER — Ambulatory Visit (HOSPITAL_COMMUNITY)
Admission: RE | Admit: 2012-05-24 | Discharge: 2012-05-24 | Disposition: A | Discharge status: Home / Self Care | Payer: Medicare Other | Source: Ambulatory Visit | Attending: Nephrology | Admitting: Nephrology

## 2012-05-24 ENCOUNTER — Other Ambulatory Visit (HOSPITAL_COMMUNITY): Payer: Self-pay | Admitting: Nephrology

## 2012-05-24 VITALS — BP 110/71 | HR 71 | Temp 97.9°F | Resp 18

## 2012-05-24 DIAGNOSIS — N186 End stage renal disease: Secondary | ICD-10-CM

## 2012-05-24 DIAGNOSIS — Z992 Dependence on renal dialysis: Secondary | ICD-10-CM | POA: Insufficient documentation

## 2012-05-24 DIAGNOSIS — Y849 Medical procedure, unspecified as the cause of abnormal reaction of the patient, or of later complication, without mention of misadventure at the time of the procedure: Secondary | ICD-10-CM | POA: Insufficient documentation

## 2012-05-24 DIAGNOSIS — I120 Hypertensive chronic kidney disease with stage 5 chronic kidney disease or end stage renal disease: Secondary | ICD-10-CM | POA: Insufficient documentation

## 2012-05-24 DIAGNOSIS — K219 Gastro-esophageal reflux disease without esophagitis: Secondary | ICD-10-CM | POA: Insufficient documentation

## 2012-05-24 DIAGNOSIS — T82898A Other specified complication of vascular prosthetic devices, implants and grafts, initial encounter: Secondary | ICD-10-CM | POA: Insufficient documentation

## 2012-05-24 LAB — PROTIME-INR: Prothrombin Time: 23.2 seconds — ABNORMAL HIGH (ref 11.6–15.2)

## 2012-05-24 LAB — POTASSIUM: Potassium: 5.4 mEq/L — ABNORMAL HIGH (ref 3.5–5.1)

## 2012-05-24 SURGERY — THROMBECTOMY ARTERIOVENOUS GORE-TEX GRAFT
Anesthesia: General | Site: Leg Upper | Laterality: Left

## 2012-05-24 MED ORDER — HEPARIN SODIUM (PORCINE) 1000 UNIT/ML IJ SOLN
INTRAMUSCULAR | Status: AC
  Filled 2012-05-24: qty 1

## 2012-05-24 MED ORDER — ALTEPLASE 100 MG IV SOLR
2.0000 mg | Freq: Once | INTRAVENOUS | Status: DC
  Filled 2012-05-24: qty 2

## 2012-05-24 MED ORDER — FENTANYL CITRATE 0.05 MG/ML IJ SOLN
INTRAMUSCULAR | Status: DC | PRN
  Administered 2012-05-24: 25 ug via INTRAVENOUS
  Administered 2012-05-24: 50 ug via INTRAVENOUS

## 2012-05-24 MED ORDER — HEPARIN SODIUM (PORCINE) 1000 UNIT/ML IJ SOLN
INTRAMUSCULAR | Status: DC | PRN
  Administered 2012-05-24: 3000 [IU] via INTRAVENOUS

## 2012-05-24 MED ORDER — ALTEPLASE 100 MG IV SOLR
INTRAVENOUS | Status: DC | PRN
  Administered 2012-05-24: 2 mg

## 2012-05-24 MED ORDER — MIDAZOLAM HCL 5 MG/5ML IJ SOLN
INTRAMUSCULAR | Status: DC | PRN
  Administered 2012-05-24 (×2): 1 mg via INTRAVENOUS

## 2012-05-24 MED ORDER — MIDAZOLAM HCL 2 MG/2ML IJ SOLN
INTRAMUSCULAR | Status: AC
  Filled 2012-05-24: qty 4

## 2012-05-24 MED ORDER — FENTANYL CITRATE 0.05 MG/ML IJ SOLN
INTRAMUSCULAR | Status: AC
  Filled 2012-05-24: qty 4

## 2012-05-24 MED ORDER — IOHEXOL 300 MG/ML  SOLN
100.0000 mL | Freq: Once | INTRAMUSCULAR | Status: AC | PRN
  Administered 2012-05-24: 40 mL via INTRAVENOUS

## 2012-05-24 NOTE — Procedures (Signed)
Declot L fem HD graft 7mm venous PTA No complication No significant  blood loss. See complete dictation in [facility].

## 2012-05-24 NOTE — H&P (Signed)
[name] is an 46 y.o. male.   Chief Complaint: clotted left leg dialysis graft HPI: Patient with history of ESRD and clotted  left thigh AVGG presents today for thrombolysis, possible angioplasty/stenting of graft or placement of new catheter if needed.  Past Medical History  Diagnosis Date  . Blindness   . Anxiety   . Hypertension   . Depression   . GERD (gastroesophageal reflux disease)   . Pneumonia   . Complication of anesthesia     "Wake up in the middle of procedure."  . Dysrhythmia   . Stroke 2003    Legally Blind  . Chronic kidney disease     Tues, Thurs, Sat  . History of blood transfusion   . Myocardial infarction 2000    no cardiologist-    Past Surgical History  Procedure Date  . Arteriovenous graft placement 11-07-1998    right upper extremity   . Arteriovenous graft placement 10-18-1998    right upper extremity  . Cardiac catheterization     Stents  . Arteriovenous graft placement     Right arm x 2  . Artreriovenous     Graft Right leg  . 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  . Peritoneal catheter insertion   . Peritoneal catheter removal   . Colon resection     due to peritonitis  . Nephrostomy     left    No family history on file. Social History:  reports that he has never smoked. He has never used smokeless tobacco. He reports that he does not drink alcohol or use illicit drugs.  Allergies: No Known Allergies  Current outpatient prescriptions:aspirin EC 81 MG tablet, Take 81 mg by mouth daily., Disp: , Rfl: ;  b complex-vitamin c-folic acid (NEPHRO-VITE) 0.8 MG TABS, Take 0.8 mg by mouth daily., Disp: , Rfl: ;  famotidine (PEPCID) 20 MG tablet, Take 20 mg by mouth daily., Disp: , Rfl: ;  hydrOXYzine (ATARAX/VISTARIL) 25 MG tablet, Take 25 mg by mouth every 6 (six) hours as needed. itching, Disp: , Rfl:  LORazepam (ATIVAN) 2 MG tablet, Take 2 mg by mouth 2 (two) times daily. anxiety, Disp: , Rfl: ;  midodrine (PROAMATINE) 5 MG  tablet, Take 10 mg by mouth 3 (three) times a week. , Disp: , Rfl: ;  simvastatin (ZOCOR) 40 MG tablet, Take 40 mg by mouth every evening., Disp: , Rfl: ;  sorbitol 70 % solution, Take 15 mLs by mouth daily as needed. constipation, Disp: , Rfl:  warfarin (COUMADIN) 5 MG tablet, Take 5 mg by mouth 3 (three) times a week. , Disp: , Rfl: ;  zolpidem (AMBIEN) 5 MG tablet, Take 5 mg by mouth at bedtime as needed. sleep, Disp: , Rfl: ;  sevelamer (RENAGEL) 800 MG tablet, Take 800 mg by mouth 3 (three) times daily with meals., Disp: , Rfl: ;  warfarin (COUMADIN) 7.5 MG tablet, Take 7.5 mg by mouth 4 (four) times a week. , Disp: , Rfl:  Current facility-administered medications:fentaNYL (SUBLIMAZE) 0.05 MG/ML injection, , , , ;  midazolam (VERSED) 2 MG/2ML injection, , , ,    Results for orders placed during the hospital encounter of 05/24/12 (from the past 48 hour(s))  POTASSIUM     Status: Abnormal   Collection Time   05/24/12 12:19 PM      Component Value Range Comment   Potassium 5.4 (*) 3.5 - 5.1 mEq/L   PROTIME-INR     Status:  Abnormal   Collection Time   05/24/12 12:19 PM      Component Value Range Comment   Prothrombin Time 23.2 (*) 11.6 - 15.2 seconds    INR 2.02 (*) 0.00 - 1.49    No results found.  Review of Systems  Constitutional: Negative for fever and chills.  Eyes:       Legally blind  Respiratory: Negative for cough and shortness of breath.   Cardiovascular: Negative for chest pain.  Gastrointestinal: Negative for nausea, vomiting and abdominal pain.  Musculoskeletal: Negative for back pain.  Neurological: Negative for headaches.    Blood pressure 120/76, pulse 70, temperature 97.9 F (36.6 C), temperature source Oral, SpO2 98.00%. Physical Exam  Constitutional: He is oriented to person, place, and time. He appears well-developed and well-nourished.  Cardiovascular: Normal rate and regular rhythm.        Left thigh AVGG with no thrill/bruit  Respiratory: Effort normal and  breath sounds normal.  GI: Soft. Bowel sounds are normal. There is no tenderness.  Musculoskeletal: Normal range of motion. He exhibits no edema.  Neurological: He is alert and oriented to person, place, and time.     Assessment/Plan Patient with ESRD and clotted left thigh AVGG; plan is for thrombolysis, possible angioplasty/stenting of graft or placement of new catheter if needed. Details of above d/w pt with his understanding and consent.  ALLRED,D KEVIN 05/24/2012, 1:00 PM

## 2012-06-11 ENCOUNTER — Other Ambulatory Visit (HOSPITAL_COMMUNITY): Payer: Self-pay | Admitting: Nephrology

## 2012-06-11 ENCOUNTER — Encounter (HOSPITAL_COMMUNITY): Payer: Self-pay | Admitting: Pharmacy Technician

## 2012-06-11 DIAGNOSIS — N186 End stage renal disease: Secondary | ICD-10-CM

## 2012-06-12 ENCOUNTER — Other Ambulatory Visit (HOSPITAL_COMMUNITY): Payer: Self-pay | Admitting: Nephrology

## 2012-06-12 ENCOUNTER — Ambulatory Visit (HOSPITAL_COMMUNITY)
Admission: RE | Admit: 2012-06-12 | Discharge: 2012-06-12 | Disposition: A | Discharge status: Home / Self Care | Payer: Medicare Other | Source: Ambulatory Visit | Attending: Nephrology | Admitting: Nephrology

## 2012-06-12 DIAGNOSIS — T82898A Other specified complication of vascular prosthetic devices, implants and grafts, initial encounter: Secondary | ICD-10-CM | POA: Insufficient documentation

## 2012-06-12 DIAGNOSIS — N186 End stage renal disease: Secondary | ICD-10-CM

## 2012-06-12 DIAGNOSIS — I252 Old myocardial infarction: Secondary | ICD-10-CM | POA: Insufficient documentation

## 2012-06-12 DIAGNOSIS — K219 Gastro-esophageal reflux disease without esophagitis: Secondary | ICD-10-CM | POA: Insufficient documentation

## 2012-06-12 DIAGNOSIS — Z992 Dependence on renal dialysis: Secondary | ICD-10-CM | POA: Insufficient documentation

## 2012-06-12 DIAGNOSIS — Z8673 Personal history of transient ischemic attack (TIA), and cerebral infarction without residual deficits: Secondary | ICD-10-CM | POA: Insufficient documentation

## 2012-06-12 DIAGNOSIS — Y849 Medical procedure, unspecified as the cause of abnormal reaction of the patient, or of later complication, without mention of misadventure at the time of the procedure: Secondary | ICD-10-CM | POA: Insufficient documentation

## 2012-06-12 DIAGNOSIS — H543 Unqualified visual loss, both eyes: Secondary | ICD-10-CM | POA: Insufficient documentation

## 2012-06-12 DIAGNOSIS — I120 Hypertensive chronic kidney disease with stage 5 chronic kidney disease or end stage renal disease: Secondary | ICD-10-CM | POA: Insufficient documentation

## 2012-06-12 LAB — POTASSIUM: Potassium: 5.4 mEq/L — ABNORMAL HIGH (ref 3.5–5.1)

## 2012-06-12 LAB — PROTIME-INR
INR: 1.64 — ABNORMAL HIGH (ref 0.00–1.49)
Prothrombin Time: 19.7 seconds — ABNORMAL HIGH (ref 11.6–15.2)

## 2012-06-12 MED ORDER — IOHEXOL 300 MG/ML  SOLN
100.0000 mL | Freq: Once | INTRAMUSCULAR | Status: AC | PRN
Start: 2012-06-12 — End: 2012-06-12
  Administered 2012-06-12: 50 mL via INTRAVENOUS

## 2012-06-12 MED ORDER — MIDAZOLAM HCL 2 MG/2ML IJ SOLN
INTRAMUSCULAR | Status: AC
  Filled 2012-06-12: qty 2

## 2012-06-12 MED ORDER — MIDAZOLAM HCL 5 MG/5ML IJ SOLN
INTRAMUSCULAR | Status: AC | PRN
  Administered 2012-06-12: 2 mg via INTRAVENOUS
  Administered 2012-06-12 (×3): 1 mg via INTRAVENOUS

## 2012-06-12 MED ORDER — HEPARIN SODIUM (PORCINE) 1000 UNIT/ML IJ SOLN
INTRAMUSCULAR | Status: AC
  Administered 2012-06-12: 3000 [IU]
  Filled 2012-06-12: qty 1

## 2012-06-12 MED ORDER — MIDAZOLAM HCL 2 MG/2ML IJ SOLN
INTRAMUSCULAR | Status: AC
  Filled 2012-06-12: qty 4

## 2012-06-12 MED ORDER — FENTANYL CITRATE 0.05 MG/ML IJ SOLN
INTRAMUSCULAR | Status: AC
  Filled 2012-06-12: qty 4

## 2012-06-12 MED ORDER — FENTANYL CITRATE 0.05 MG/ML IJ SOLN
INTRAMUSCULAR | Status: AC | PRN
  Administered 2012-06-12 (×3): 50 ug via INTRAVENOUS
  Administered 2012-06-12: 25 ug via INTRAVENOUS

## 2012-06-12 MED ORDER — ALTEPLASE 100 MG IV SOLR
2.0000 mg | Freq: Once | INTRAVENOUS | Status: AC
  Administered 2012-06-12: 2 mg
  Filled 2012-06-12: qty 2

## 2012-06-12 NOTE — ED Notes (Signed)
Procedure documented under separate encounter.

## 2012-06-12 NOTE — H&P (Signed)
[name] is an 46 y.o. male.   Chief Complaint: Thrombosed AVG HPI: Thrombosed AVG  Past Medical History  Diagnosis Date  . Blindness   . Anxiety   . Hypertension   . Depression   . GERD (gastroesophageal reflux disease)   . Pneumonia   . Complication of anesthesia     "Wake up in the middle of procedure."  . Dysrhythmia   . Stroke 2003    Legally Blind  . Chronic kidney disease     Tues, Thurs, Sat  . History of blood transfusion   . Myocardial infarction 2000    no cardiologist-    Past Surgical History  Procedure Date  . Arteriovenous graft placement 11-07-1998    right upper extremity   . Arteriovenous graft placement 10-18-1998    right upper extremity  . Cardiac catheterization     Stents  . Arteriovenous graft placement     Right arm x 2  . Artreriovenous     Graft Right leg  . 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  . Peritoneal catheter insertion   . Peritoneal catheter removal   . Colon resection     due to peritonitis  . Nephrostomy     left    No family history on file. Social History:  reports that he has never smoked. He has never used smokeless tobacco. He reports that he does not drink alcohol or use illicit drugs.  Allergies: No Known Allergies   (Not in a hospital admission)  No results found for this or any previous visit (from the past 48 hour(s)). No results found.  ROS  Blood pressure 122/85, pulse 74, temperature 98.4 F (36.9 C), resp. rate 14, SpO2 100.00%. Physical Exam  Constitutional: He is oriented to person, place, and time. He appears well-developed and well-nourished.  Neck: Normal range of motion.  Cardiovascular: Normal rate and regular rhythm.   Respiratory: Effort normal and breath sounds normal.  GI: Soft.  Neurological: He is alert and oriented to person, place, and time.       Blind  Skin: Skin is warm and dry.  Psychiatric: He has a normal mood and affect.     Assessment/Plan AVG  thrombectomy  Leighton Brickley,ART A 06/12/2012, 8:17 AM

## 2012-06-12 NOTE — Procedures (Signed)
L thigh AVG declot 7 mm PTA venous No comp

## 2012-06-14 LAB — POCT I-STAT 4, (NA,K, GLUC, HGB,HCT)
HCT: 36 % — ABNORMAL LOW (ref 39.0–52.0)
Hemoglobin: 12.2 g/dL — ABNORMAL LOW (ref 13.0–17.0)

## 2012-06-22 ENCOUNTER — Other Ambulatory Visit: Payer: Self-pay | Admitting: Nephrology

## 2012-06-22 LAB — PROTIME-INR
INR: 1.3
Prothrombin Time: 16.3 secs — ABNORMAL HIGH (ref 11.5–14.7)

## 2012-06-23 ENCOUNTER — Other Ambulatory Visit: Payer: Self-pay

## 2012-06-23 DIAGNOSIS — T82898A Other specified complication of vascular prosthetic devices, implants and grafts, initial encounter: Secondary | ICD-10-CM

## 2012-06-25 ENCOUNTER — Other Ambulatory Visit (HOSPITAL_COMMUNITY): Payer: Self-pay | Admitting: Nephrology

## 2012-06-25 ENCOUNTER — Ambulatory Visit (HOSPITAL_COMMUNITY)
Admission: RE | Admit: 2012-06-25 | Discharge: 2012-06-25 | Disposition: A | Discharge status: Home / Self Care | Payer: Medicare Other | Source: Ambulatory Visit | Attending: Nephrology | Admitting: Nephrology

## 2012-06-25 ENCOUNTER — Encounter (HOSPITAL_COMMUNITY): Payer: Self-pay

## 2012-06-25 DIAGNOSIS — I120 Hypertensive chronic kidney disease with stage 5 chronic kidney disease or end stage renal disease: Secondary | ICD-10-CM | POA: Insufficient documentation

## 2012-06-25 DIAGNOSIS — N186 End stage renal disease: Secondary | ICD-10-CM

## 2012-06-25 DIAGNOSIS — Z992 Dependence on renal dialysis: Secondary | ICD-10-CM | POA: Insufficient documentation

## 2012-06-25 DIAGNOSIS — F411 Generalized anxiety disorder: Secondary | ICD-10-CM | POA: Insufficient documentation

## 2012-06-25 DIAGNOSIS — Y849 Medical procedure, unspecified as the cause of abnormal reaction of the patient, or of later complication, without mention of misadventure at the time of the procedure: Secondary | ICD-10-CM | POA: Insufficient documentation

## 2012-06-25 DIAGNOSIS — K219 Gastro-esophageal reflux disease without esophagitis: Secondary | ICD-10-CM | POA: Insufficient documentation

## 2012-06-25 DIAGNOSIS — I69998 Other sequelae following unspecified cerebrovascular disease: Secondary | ICD-10-CM | POA: Insufficient documentation

## 2012-06-25 DIAGNOSIS — T82898A Other specified complication of vascular prosthetic devices, implants and grafts, initial encounter: Secondary | ICD-10-CM | POA: Insufficient documentation

## 2012-06-25 DIAGNOSIS — H548 Legal blindness, as defined in USA: Secondary | ICD-10-CM | POA: Insufficient documentation

## 2012-06-25 DIAGNOSIS — H539 Unspecified visual disturbance: Secondary | ICD-10-CM | POA: Insufficient documentation

## 2012-06-25 LAB — POCT I-STAT 4, (NA,K, GLUC, HGB,HCT)
Glucose, Bld: 84 mg/dL (ref 70–99)
Potassium: 4.3 mEq/L (ref 3.5–5.1)
Sodium: 141 mEq/L (ref 135–145)

## 2012-06-25 MED ORDER — FENTANYL CITRATE 0.05 MG/ML IJ SOLN
INTRAMUSCULAR | Status: AC | PRN
  Administered 2012-06-25: 50 ug via INTRAVENOUS
  Administered 2012-06-25 (×5): 25 ug via INTRAVENOUS
  Administered 2012-06-25: 50 ug via INTRAVENOUS
  Administered 2012-06-25: 25 ug via INTRAVENOUS

## 2012-06-25 MED ORDER — IOHEXOL 300 MG/ML  SOLN
150.0000 mL | Freq: Once | INTRAMUSCULAR | Status: AC | PRN
  Administered 2012-06-25: 80 mL via INTRAVENOUS

## 2012-06-25 MED ORDER — HEPARIN SODIUM (PORCINE) 1000 UNIT/ML IJ SOLN
INTRAMUSCULAR | Status: AC | PRN
  Administered 2012-06-25: 3000 [IU] via INTRAVENOUS

## 2012-06-25 MED ORDER — ALTEPLASE 2 MG IJ SOLR
2.0000 mg | Freq: Once | INTRAMUSCULAR | Status: DC
  Filled 2012-06-25: qty 2

## 2012-06-25 MED ORDER — HEPARIN SODIUM (PORCINE) 1000 UNIT/ML IJ SOLN
INTRAMUSCULAR | Status: AC
  Filled 2012-06-25: qty 1

## 2012-06-25 MED ORDER — ALTEPLASE 100 MG IV SOLR
INTRAVENOUS | Status: AC | PRN
  Administered 2012-06-25: 2 mg

## 2012-06-25 MED ORDER — MIDAZOLAM HCL 5 MG/5ML IJ SOLN
INTRAMUSCULAR | Status: AC | PRN
  Administered 2012-06-25: 1 mg via INTRAVENOUS
  Administered 2012-06-25: 2 mg via INTRAVENOUS
  Administered 2012-06-25: 1 mg via INTRAVENOUS

## 2012-06-25 MED ORDER — MIDAZOLAM HCL 2 MG/2ML IJ SOLN
INTRAMUSCULAR | Status: AC
  Filled 2012-06-25: qty 6

## 2012-06-25 MED ORDER — FENTANYL CITRATE 0.05 MG/ML IJ SOLN
INTRAMUSCULAR | Status: AC
  Filled 2012-06-25: qty 6

## 2012-06-25 NOTE — ED Notes (Addendum)
K 4.3

## 2012-06-25 NOTE — H&P (Signed)
Chief Complaint: Thrombosed (L)thigh AVG Referring Physician:Deterding HPI: [name] is an 46 y.o. male with ESRD who had a (L)thigh declot and angioplasty about 2 weeks ago. Had HD yesterday which went well until the end, and then it clotted. He is referred for declot procedure, which he is very familiar with. No changes to PMHx since we last saw him.  Past Medical History:  Past Medical History  Diagnosis Date  . Blindness   . Anxiety   . Hypertension   . Depression   . GERD (gastroesophageal reflux disease)   . Pneumonia   . Complication of anesthesia     "Wake up in the middle of procedure."  . Dysrhythmia   . Stroke 2003    Legally Blind  . Chronic kidney disease     Tues, Thurs, Sat  . History of blood transfusion   . Myocardial infarction 2000    no cardiologist-    Past Surgical History:  Past Surgical History  Procedure Date  . Arteriovenous graft placement 11-07-1998    right upper extremity   . Arteriovenous graft placement 10-18-1998    right upper extremity  . Cardiac catheterization     Stents  . Arteriovenous graft placement     Right arm x 2  . Artreriovenous     Graft Right leg  . 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  . Peritoneal catheter insertion   . Peritoneal catheter removal   . Colon resection     due to peritonitis  . Nephrostomy     left    Family History: No family history on file.  Social History:  reports that he has never smoked. He has never used smokeless tobacco. He reports that he does not drink alcohol or use illicit drugs.  Allergies: No Known Allergies  Medications: aspirin EC 81 MG tablet (Taking) Sig - Route: Take 81 mg by mouth daily. - Oral Class: Historical Med Number of times this order has been changed since signing: 1 Order [address] b complex-vitamin c-folic acid (NEPHRO-VITE) 0.8 MG TABS (Taking) Sig - Route: Take 0.8 mg by mouth daily. - Oral Class: Historical Med Number of times this order has  been changed since signing: 1 Order [address] famotidine (PEPCID) 20 MG tablet (Taking) Sig - Route: Take 20 mg by mouth daily. - Oral Class: Historical Med Number of times this order has been changed since signing: 1 Order [address] hydrOXYzine (ATARAX/VISTARIL) 25 MG tablet (Taking) Sig - Route: Take 25 mg by mouth every 6 (six) hours as needed. For itching - Oral Class: Historical Med Number of times this order has been changed since signing: 3 Order [address] LORazepam (ATIVAN) 2 MG tablet (Taking) Sig - Route: Take 2 mg by mouth 2 (two) times daily as needed. For anxiety - Oral Class: Historical Med Number of times this order has been changed since signing: 3 Order [address] midodrine (PROAMATINE) 5 MG tablet (Taking) Sig - Route: Take 10 mg by mouth 3 (three) times a week. - Oral Class: Historical Med Number of times this order has been changed since signing: 4 Order [address] sevelamer (RENAGEL) 800 MG tablet (Taking) Sig - Route: Take 3,200 mg by mouth 3 (three) times daily with meals. - Oral Class: Historical Med Number of times this order has been changed since signing: 3 Order [address] simvastatin (ZOCOR) 40 MG tablet (Taking) Sig - Route: Take 40 mg by mouth every evening. -  Oral Class: Historical Med Number of times this order has been changed since signing: 1 Order [address] sorbitol 70 % solution (Taking) Sig - Route: Take 15 mLs by mouth daily as needed. For constipation - Oral Class: Historical Med Number of times this order has been changed since signing: 3 Order [address] warfarin (COUMADIN) 5 MG tablet (Taking) Sig - Route: Take 5 mg by mouth daily. - Oral Class: Historical Med Number of times this order has been changed since signing: 1 Order [address] zolpidem (AMBIEN) 5 MG tablet (Taking) Sig - Route: Take 5 mg by mouth at bedtime as needed. sleep    Please HPI for pertinent positives, otherwise complete 10 system ROS negative.  Physical Exam: Blood pressure 108/65,  pulse 83, temperature 98.5 F (36.9 C), temperature source Oral, resp. rate 16, SpO2 100.00%. There is no height or weight on file to calculate BMI.   General Appearance:  Alert, cooperative, no distress, appears stated age  Head:  Normocephalic, without obvious abnormality, atraumatic  ENT: Unremarkable  Neck: Supple, symmetrical, trachea midline, no adenopathy, thyroid: not enlarged, symmetric, no tenderness/mass/nodules  Lungs:   Clear to auscultation bilaterally, no w/r/r, respirations unlabored without use of accessory muscles.  Chest Wall:  No tenderness or deformity  Heart:  Regular rate and rhythm, S1, S2 normal, no murmur, rub or gallop. Carotids 2+ without bruit.  Abdomen:   Soft, non-tender, non distended. Bowel sounds active all four quadrants,  no masses, no organomegaly.  Extremities: Extremities normal, atraumatic, no cyanosis or edema  Pulses: 2+ and symmetric  Skin: Skin color, texture, turgor normal, no rashes or lesions  Neurologic: Normal affect, no gross deficits.   No results found for this or any previous visit (from the past 48 hour(s)). No results found.  Assessment/Plan Thrombosed (L)thigh AVG For thrombectomy/thrombolysis, angioplasty procedure. Reviewed risks with pt, including possibility of needing temp cath placement. Consent signed in chart  Brayton El PA-C 06/25/2012, 1:01 PM

## 2012-06-30 ENCOUNTER — Other Ambulatory Visit (HOSPITAL_COMMUNITY): Payer: Self-pay | Admitting: Nephrology

## 2012-06-30 DIAGNOSIS — N186 End stage renal disease: Secondary | ICD-10-CM

## 2012-07-08 ENCOUNTER — Encounter: Payer: Self-pay | Admitting: Vascular Surgery

## 2012-07-09 ENCOUNTER — Encounter: Payer: Self-pay | Admitting: Vascular Surgery

## 2012-07-09 ENCOUNTER — Encounter (INDEPENDENT_AMBULATORY_CARE_PROVIDER_SITE_OTHER): Payer: Medicare Other | Admitting: *Deleted

## 2012-07-09 ENCOUNTER — Ambulatory Visit (INDEPENDENT_AMBULATORY_CARE_PROVIDER_SITE_OTHER): Payer: Medicare Other | Admitting: Vascular Surgery

## 2012-07-09 VITALS — BP 104/69 | HR 87 | Resp 16 | Ht 67.5 in | Wt 186.3 lb

## 2012-07-09 DIAGNOSIS — N186 End stage renal disease: Secondary | ICD-10-CM

## 2012-07-09 DIAGNOSIS — T82898A Other specified complication of vascular prosthetic devices, implants and grafts, initial encounter: Secondary | ICD-10-CM

## 2012-07-09 DIAGNOSIS — Z4931 Encounter for adequacy testing for hemodialysis: Secondary | ICD-10-CM

## 2012-07-09 NOTE — Addendum Note (Signed)
Addended by: Sharee Pimple on: 07/09/2012 02:44 PM   Modules accepted: Orders

## 2012-07-09 NOTE — Progress Notes (Signed)
[facility] [place]  Established Dialysis Access  History of Present Illness  GEARALD STONEBRAKER is a 46 y.o. (1966/07/29) male who presents for re-evaluation.  Pt recently had a L thigh perc. Thrombectomy complicated by intraprocedure rupture of the thigh AVG treated by covered stent placement. (06/25/12)   The patient has continued to successful dialyze with L thigh AVG.  Note the prior percutaneous thrombectomy was completed on 17 AUG 13  Past Medical History, Past Surgical History, Social History, Family History, Medications, Allergies, and Review of Systems are unchanged from previous visit on 06/25/12.  Physical Examination  Filed Vitals:   07/09/12 1145  BP: 104/69  Pulse: 87  Resp: 16  Height: 5' 7.5" (1.715 m)  Weight: 186 lb 4.6 oz (84.5 kg)  SpO2: 100%   Body mass index is 28.75 kg/(m^2).  General: A&O x 3, WDWN  Pulmonary: Sym exp, good air movt, CTAB, no rales, rhonchi, & wheezing  Cardiac: RRR, Nl S1, S2, no Murmurs, rubs or gallops  Gastrointestinal: soft, NTND, -G/R, - HSM, - masses, - CVAT B  Musculoskeletal: M/S 5/5 throughout , Extremities without  ischemic changes , palpable thrill and bruit , lateral segment, skin with a few areas of breakdown  Neurologic: Pain and light touch intact in extremities , Motor exam as listed above  Non-Invasive Vascular Imaging  L thigh AVG duplex (Date: 07/09/12):   Arterial anastomosis: 257 c/s  Intra-graft: 130-176 c/s  Intra-stent: 162 c/s  Venous anastomosis: 143 c/s  Small pseudoaneurysm on venous arm: 0.7 x 0.4 x 0.7 cm^3  Medical Decision Making  NICKOLAS CHALFIN is a 46 y.o. male who presents with ESRD requiring hemodialysis s/p recent L percutaneous thrombectomy complicated by AVG rupture treated by covered stent  After reviewing the 06/12/12 and 06/25/12, there was likely an intraprocedure rupture of the arteriovenous graft, which is not that uncommon.  This was successfully treated with a  covered stent.  If the patient thromboses again, open surgical thrombectomy and revision should be considered as it will likely be at the stent that the AVG occludes.  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  He is at high risk for poor wound healing and subsequent graft infection.  At this point, I recommended to the patient q3 month access duplex to keep an eye on this AVG which at risk for thrombosis.  Leonides Sake, MD [facility] Office: [phone] Pager: [phone]  07/09/2012, 12:12 PM

## 2012-08-24 IMAGING — CR DG CHEST 1V PORT
1 series · 1 of 1 positions shown · non-contrast
Comparison: none

REASON FOR EXAM: tachycardia
COMMENTS:

[view not recorded]
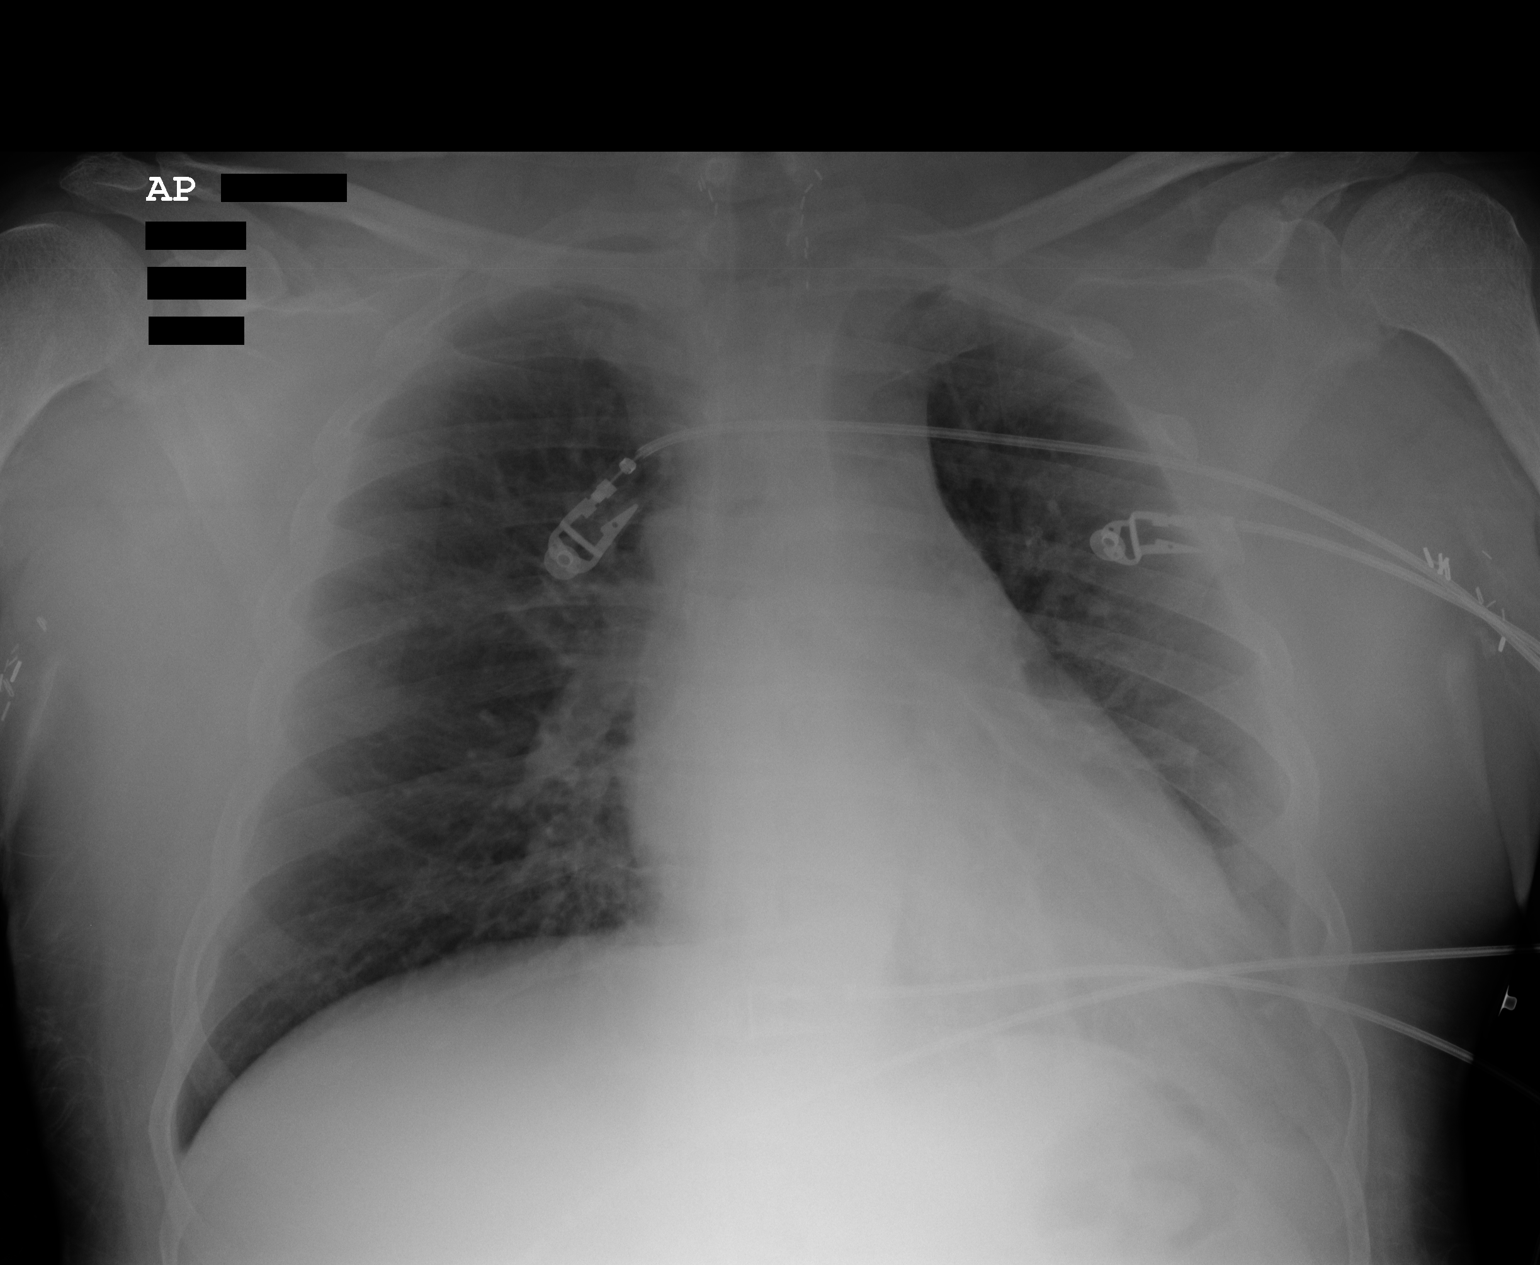

[1 of 1 positions shown; findings below may reference images not displayed]

PROCEDURE:     DXR - DXR PORTABLE CHEST SINGLE VIEW  - November 30, 2010  [DATE]

RESULT:     Comparison made to study at 20 to August 2009.

The lungs are adequately inflated. There is no focal infiltrate. The cardiac
silhouette is mildly enlarged. The central pulmonary vascularity is
prominent. Mildly increased interstitial markings are present. I see no
definite pleural effusion.
IMPRESSION: The findings suggest mild pulmonary vascular congestion.
There is no focal infiltrate. There is mild chronic pleural thickening along
the lower left lateral pleural surface.

## 2012-08-28 IMAGING — CR DG CHEST 1V PORT
1 series · 1 of 1 positions shown · non-contrast
Comparison: 08/01/2010

CLINICAL DATA: Renal failure.  Hemodialysis  catheter replacement.

PORTABLE CHEST - 1 VIEW

[view not recorded]
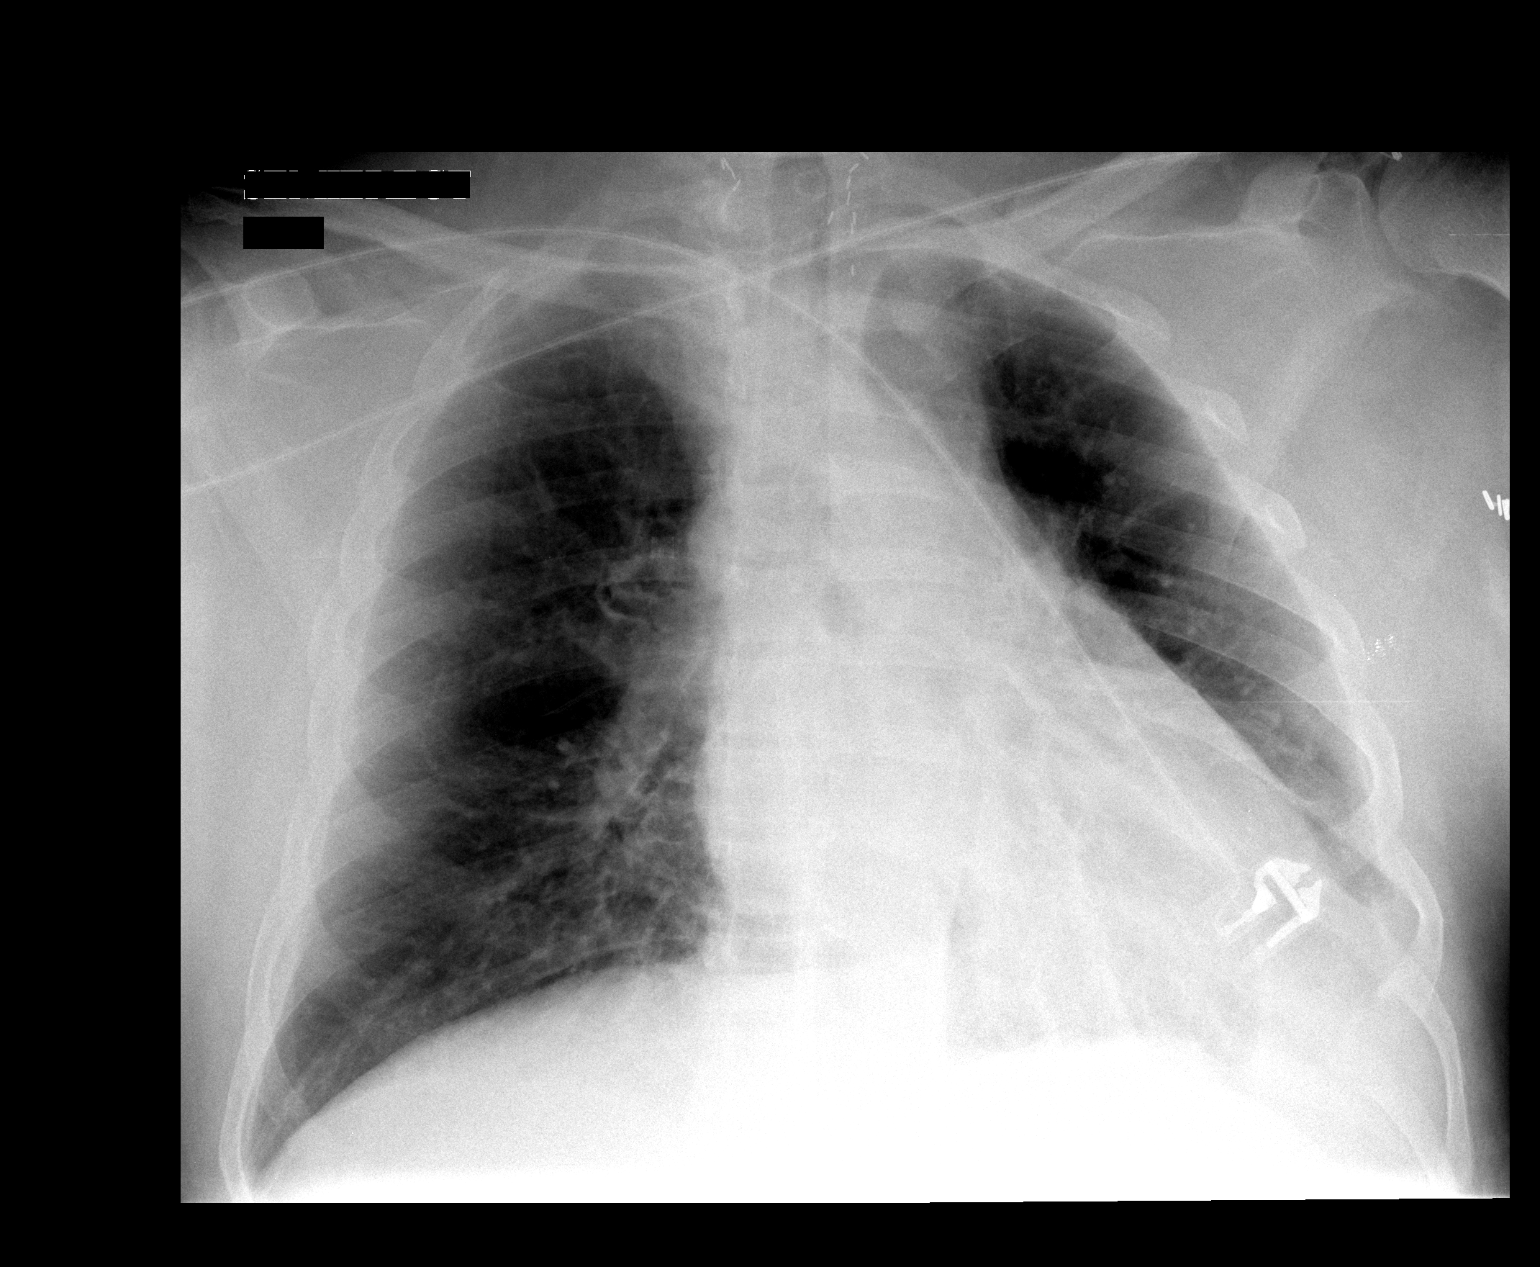

[1 of 1 positions shown; findings below may reference images not displayed]

FINDINGS: A central venous catheter is identified extending from
the abdomen pointing cephalad with tip overlying the SVC - atrial
junction.
Left basilar atelectasis/scarring is again noted.
There is no evidence of pneumothorax.
The cardiomediastinal silhouette is stable.
IMPRESSION: Central venous catheter extending from the abdomen with tip
overlying the SVC - atrial junction.

Continued left basilar atelectasis/scarring.

## 2012-09-28 IMAGING — CT CT HEAD WITHOUT CONTRAST
2 series · 15 of 30 positions shown, 19 images · non-contrast
Comparison: none

REASON FOR EXAM: altered mental status
COMMENTS:

[Series 4: without · axial · non-contrast · 0.43mm/px · z∈[-194,-68]mm · 13 of 31 slices shown, 17 images]
[im 3/31  brain]
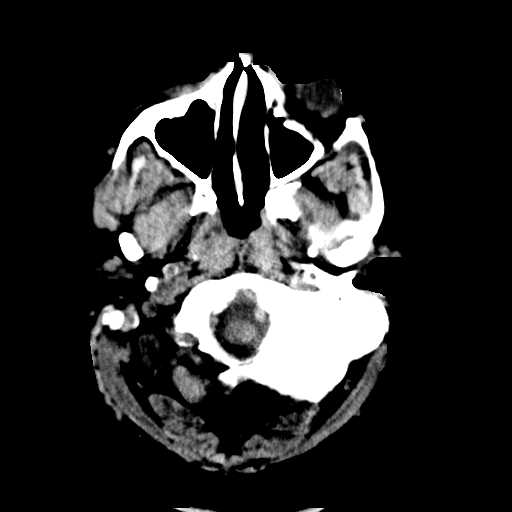
[im 3/31  bone]
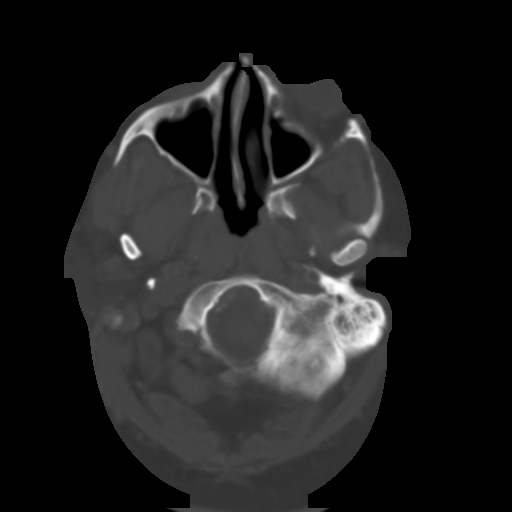
[im 5/31  brain]
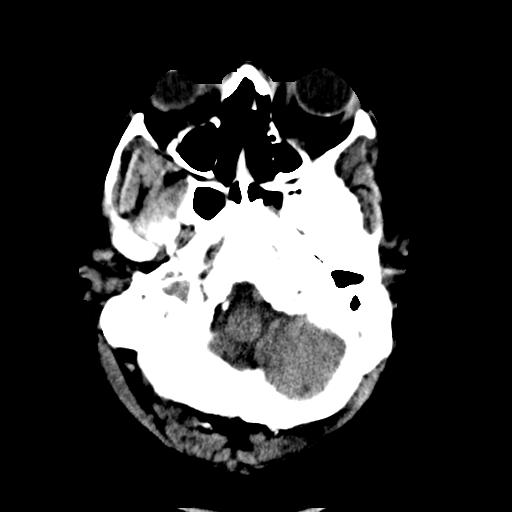
[im 7/31  brain]
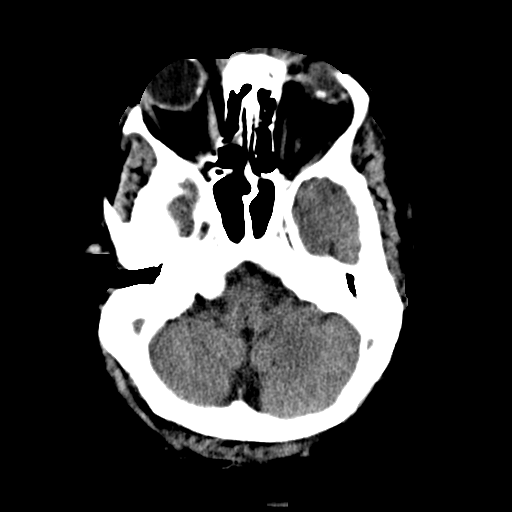
[im 9/31  brain]
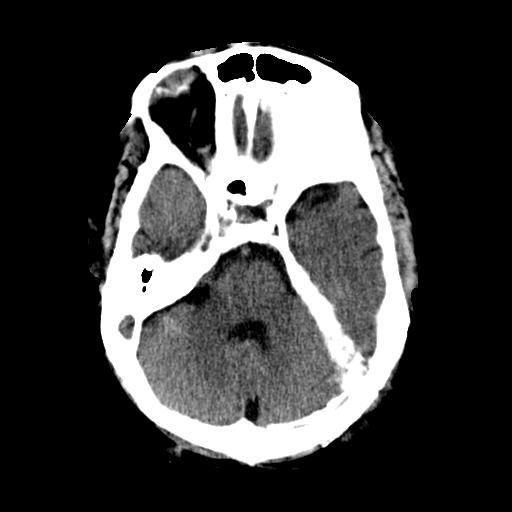
[im 11/31  brain]
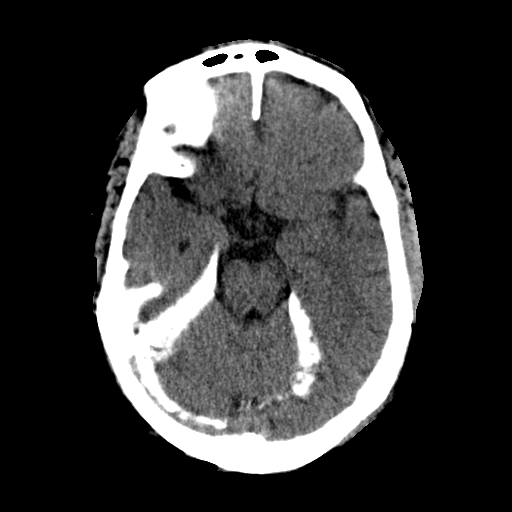
[im 11/31  bone]
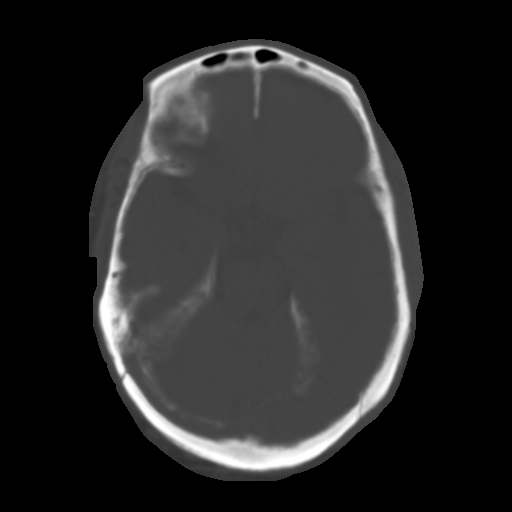
[im 13/31  brain]
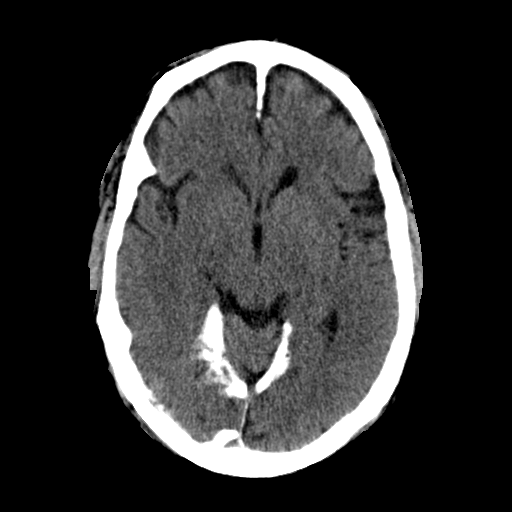
[im 16/31  brain]
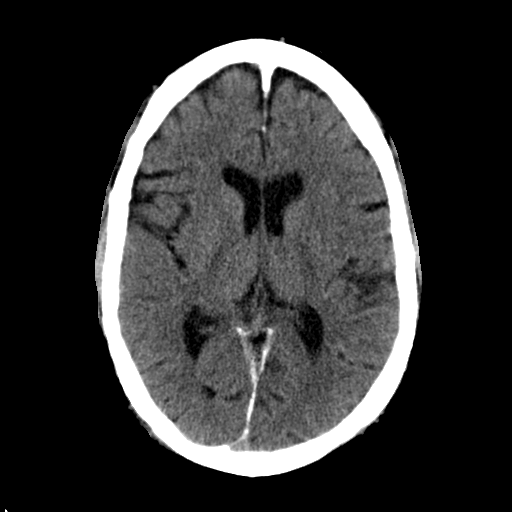
[im 18/31  brain]
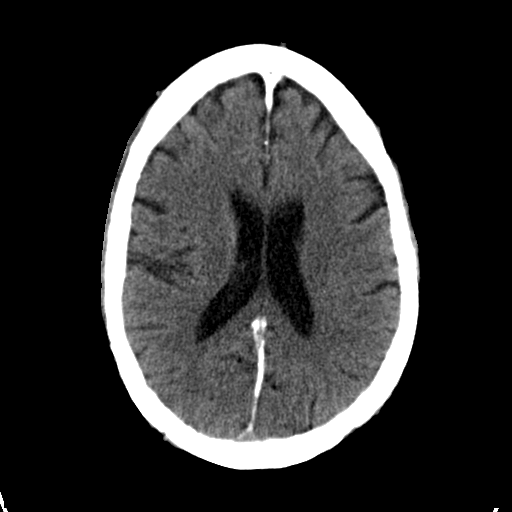
[im 20/31  brain]
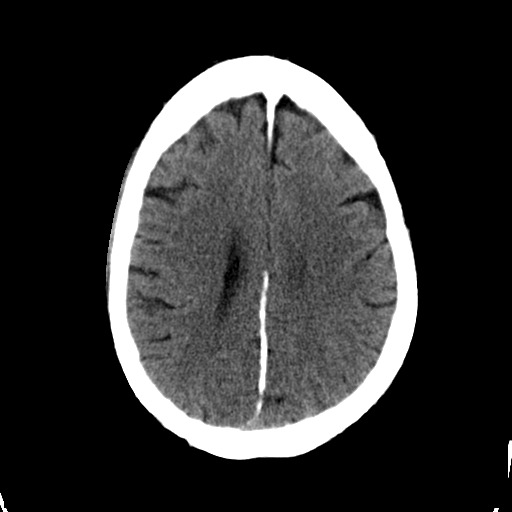
[im 20/31  bone]
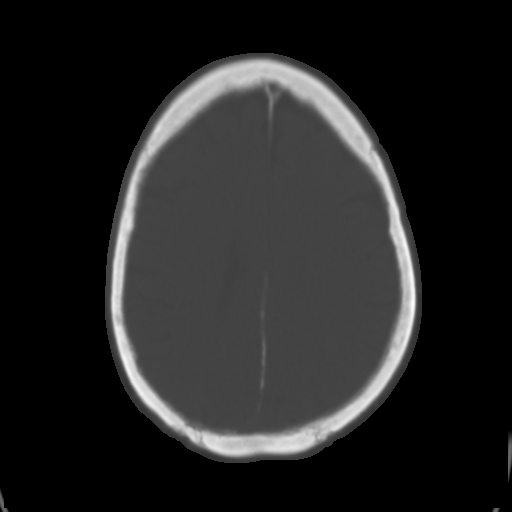
[im 22/31  brain]
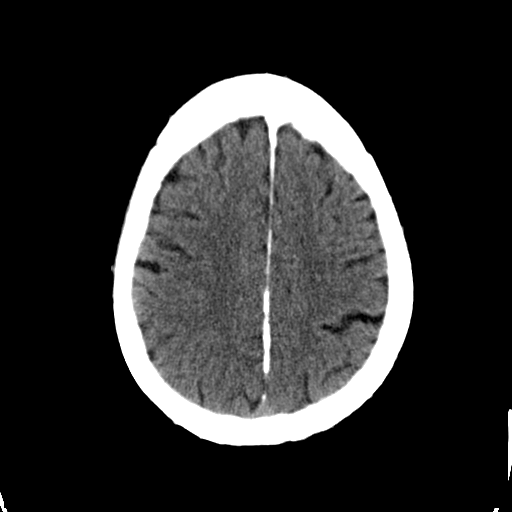
[im 24/31  brain]
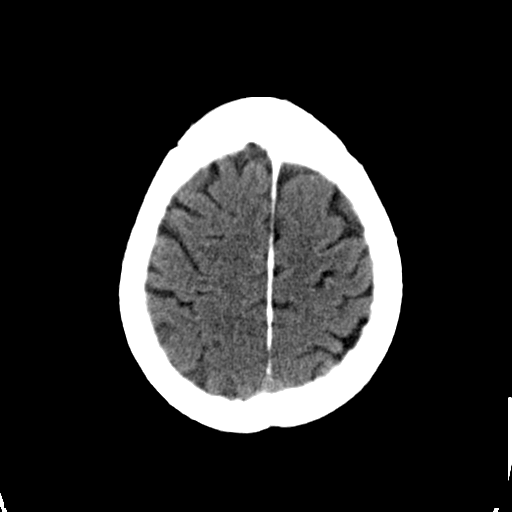
[im 26/31  brain]
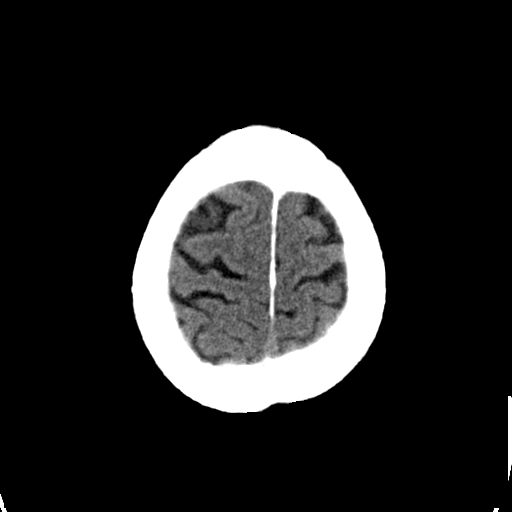
[im 28/31  brain]
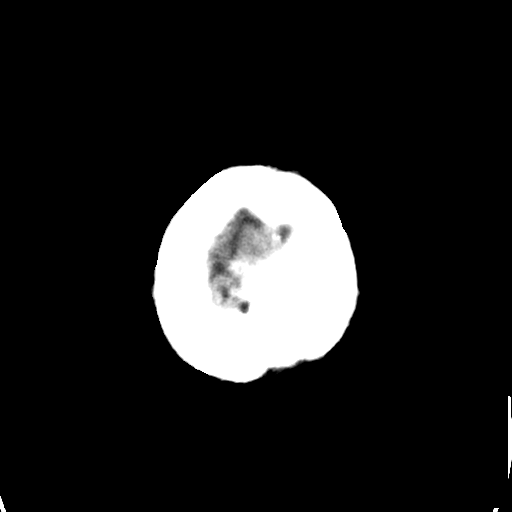
[im 28/31  bone]
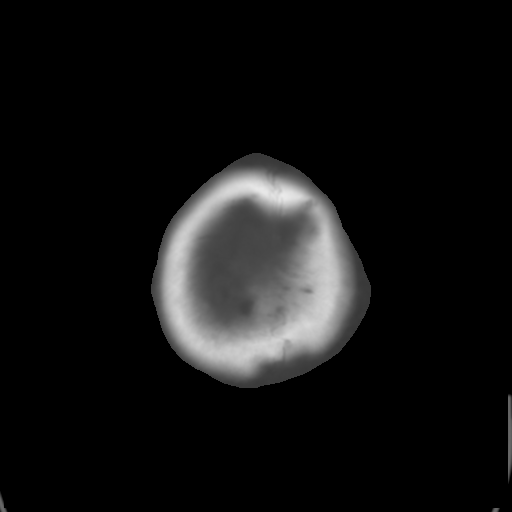

[Series 5: bone · axial · 0.43mm/px · z∈[-194,-174]mm · 2 of 31 slices shown]
[im 3/31  bone]
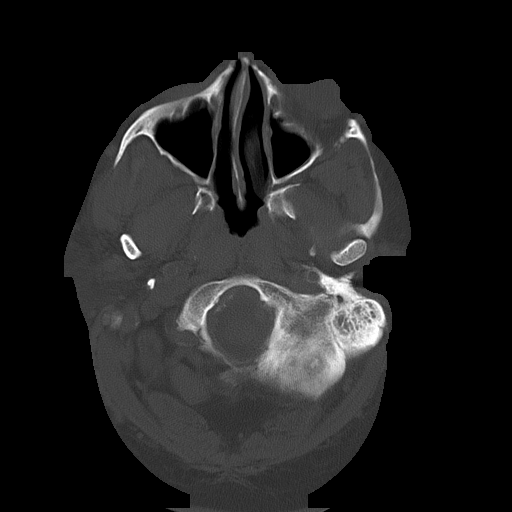
[im 7/31  bone]
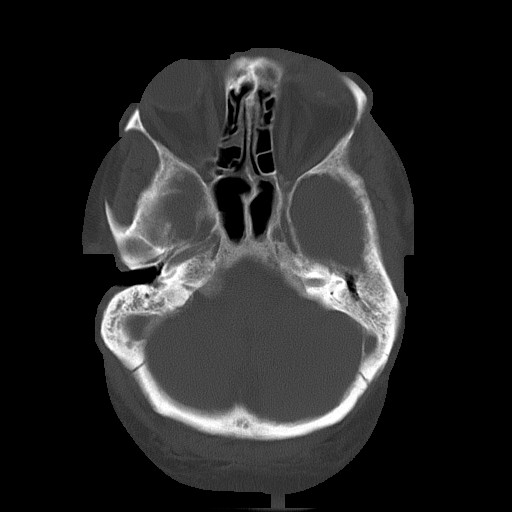

[15 of 30 positions shown; findings below may reference images not displayed]

PROCEDURE:     CT  - CT HEAD WITHOUT CONTRAST  - January 04, 2011  [DATE]

RESULT:     Axial noncontrast CT scanning was performed through the brain at
5 mm intervals and slice thicknesses. There are no prior studies available
for comparison. Comparison is made to the report of a study from [facility]

Again demonstrated is extensive calcification of the falx cerebri and
tentorium cerebelli. The ventricles are normal in size and position. There
is no intracranial hemorrhage nor intracranial mass effect. The cerebellum
and brainstem are normal in density. There is no evidence of an evolving
ischemic event. At bone window settings the observed portions of the
paranasal sinuses reveal no air-fluid levels but there is mucoperiosteal
thickening within the left sphenoid and os ethmoid sinus cells. I do not see
evidence of acute skull fracture.
IMPRESSION: 1. I see no acute abnormality of the brain.
2. Mucoperiosteal thickening involving the sphenoid ethmoid sinuses may
reflect sinusitis.

## 2012-10-05 ENCOUNTER — Other Ambulatory Visit: Payer: Self-pay | Admitting: Nephrology

## 2012-10-05 LAB — PROTIME-INR: INR: 1.6

## 2012-10-08 ENCOUNTER — Ambulatory Visit: Payer: Medicare Other | Admitting: Vascular Surgery

## 2012-11-11 ENCOUNTER — Encounter: Payer: Self-pay | Admitting: Vascular Surgery

## 2012-11-12 ENCOUNTER — Ambulatory Visit: Payer: Medicare Other | Admitting: Vascular Surgery

## 2012-11-25 ENCOUNTER — Encounter: Payer: Self-pay | Admitting: Vascular Surgery

## 2012-11-26 ENCOUNTER — Ambulatory Visit (INDEPENDENT_AMBULATORY_CARE_PROVIDER_SITE_OTHER): Payer: Medicare Other | Admitting: Vascular Surgery

## 2012-11-26 ENCOUNTER — Encounter: Payer: Self-pay | Admitting: Vascular Surgery

## 2012-11-26 VITALS — BP 111/63 | HR 93 | Ht 67.5 in | Wt 186.0 lb

## 2012-11-26 DIAGNOSIS — Z4931 Encounter for adequacy testing for hemodialysis: Secondary | ICD-10-CM

## 2012-11-26 DIAGNOSIS — T82898A Other specified complication of vascular prosthetic devices, implants and grafts, initial encounter: Secondary | ICD-10-CM

## 2012-11-26 DIAGNOSIS — N186 End stage renal disease: Secondary | ICD-10-CM

## 2012-11-26 NOTE — Progress Notes (Signed)
Left arteriovenous thigh graft duplex performed @ [facility] 11/26/2012

## 2012-11-26 NOTE — Progress Notes (Signed)
[facility] [place]  Established Dialysis Access  History of Present Illness  [name] is a 47 y.o. (Jul 26, 1966) male who presents for re-evaluation for left thigh arteriovenous graft.  The patient is able to continue dialyzing from the left thigh arteriovenous graft.  Recently he has a small erosion over a portion of the thigh arteriovenous graft.  He notes no significant bleeding to date.  He has had no fever or chills.  Last procedure completed on thigh AVG was a percutaneous thrombectomy by IR which was complicated with graft rupture requiring a covered stent placement.    Past Medical History, Past Surgical History, Social History, Family History, Medications, Allergies, and Review of Systems are unchanged from previous visit on 04/19/12.  Physical Examination  Filed Vitals:   11/26/12 1558  BP: 111/63  Pulse: 93  Height: 5' 7.5" (1.715 m)  Weight: 186 lb (84.369 kg)  SpO2: 96%   Body mass index is 28.70 kg/(m^2).  General: A&O x 3, WDWN  Pulmonary: Sym exp, good air movt, CTAB, no rales, rhonchi, & wheezing  Cardiac: RRR, Nl S1, S2, no Murmurs, rubs or gallops  Gastrointestinal: soft, NTND, -G/R, - HSM, - masses, - CVAT B  Musculoskeletal: M/S 5/5 throughout , Extremities without  ischemic changes , shallow ulcer adjacent to segment of graft, palpable thrill in thigh AVG, good bruit  Neurologic: Pain and light touch intact in extremities , Motor exam as listed above  Non-Invasive Vascular Imaging  L thigh AVG duplex (Date: 11/26/12):   Inflow: 132 c/s  Arterial anast: 276 c/s  Graft: 191-513 c/s  Venous anast: 400 c/s  Medical Decision Making  [name] is a 47 y.o. male who presents with ESRD requiring hemodialysis, recurrent venous stenosis within thigh AVG..    The patient will need L thigh shuntogram and venoplasty of the venous anastomosis.   However, I would give the shallow ulcerated segment 2 weeks to heal to try to  avoid rupturing the graft at this segment.  The patient will follow up in two week for wound check.  Leonides Sake, MD [facility] Office: [phone] Pager: [phone]  11/26/2012, 4:37 PM

## 2012-12-10 ENCOUNTER — Ambulatory Visit: Payer: Medicare Other | Admitting: Vascular Surgery

## 2012-12-16 ENCOUNTER — Encounter: Payer: Self-pay | Admitting: Vascular Surgery

## 2012-12-17 ENCOUNTER — Ambulatory Visit: Payer: Medicare Other | Admitting: Vascular Surgery

## 2013-01-06 ENCOUNTER — Encounter: Payer: Self-pay | Admitting: Vascular Surgery

## 2013-01-07 ENCOUNTER — Ambulatory Visit (INDEPENDENT_AMBULATORY_CARE_PROVIDER_SITE_OTHER): Payer: Medicare Other | Admitting: Vascular Surgery

## 2013-01-07 ENCOUNTER — Encounter: Payer: Self-pay | Admitting: Vascular Surgery

## 2013-01-07 VITALS — BP 109/68 | HR 94 | Ht 67.5 in | Wt 191.8 lb

## 2013-01-07 DIAGNOSIS — N186 End stage renal disease: Secondary | ICD-10-CM

## 2013-01-07 NOTE — Progress Notes (Signed)
[facility] [place]   Established Dialysis Access   History of Present Illness  [name] is a 47 y.o. (Mar 11, 1966) male who presents for re-evaluation of his left thigh arteriovenous graft.  His prior erosion has healed.  He denies any significant flow problems with the fistula.  He denies any bleeding complications.  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  His percutaneous procedures have been complicated with prior rupture requiring a covered stent placement.  His access duplex demonstrated some stenosis at his last visit.  I delayed intervention to allow him time to heal an erosion adjacent to a segment of the graft.  Past Medical History  Diagnosis Date  . Blindness   . Anxiety   . Hypertension   . Depression   . GERD (gastroesophageal reflux disease)   . Pneumonia   . Complication of anesthesia     "Wake up in the middle of procedure."  . Dysrhythmia   . Stroke 2003    Legally Blind  . Chronic kidney disease     Tues, Thurs, Sat  . History of blood transfusion   . Myocardial infarction 2000    no cardiologist-    Past Surgical History  Procedure Laterality Date  . Arteriovenous graft placement  11-07-1998    right upper extremity   . Arteriovenous graft placement  10-18-1998    right upper extremity  . Cardiac catheterization      Stents  . Arteriovenous graft placement      Right arm x 2  . Artreriovenous      Graft Right leg  . 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  . Peritoneal catheter insertion    . Peritoneal catheter removal    . Colon resection      due to peritonitis  . Nephrostomy      left  . Sp declot avgg  04/19/12    Left Thigh    History   Social History  . Marital Status: Single    Spouse Name: N/A    Number of Children: N/A  . Years of Education: N/A   Occupational History  . Not on file.   Social History Main  Topics  . Smoking status: Never Smoker   . Smokeless tobacco: Never Used  . Alcohol Use: No  . Drug Use: No  . Sexually Active: Not Currently   Other Topics Concern  . Not on file   Social History Narrative  . No narrative on file    Family History  Problem Relation Age of Onset  . Cancer Father     Lung    Current Outpatient Prescriptions on File Prior to Visit  Medication Sig Dispense Refill  . aspirin EC 81 MG tablet Take 81 mg by mouth daily.      Marland Kitchen b complex-vitamin c-folic acid (NEPHRO-VITE) 0.8 MG TABS Take 0.8 mg by mouth daily.      . famotidine (PEPCID) 20 MG tablet Take 20 mg by mouth daily.      . hydrOXYzine (ATARAX/VISTARIL) 25 MG tablet Take 25 mg by mouth every 6 (six) hours as needed. For itching      . LORazepam (ATIVAN) 2 MG tablet Take 2 mg by mouth 2 (two) times daily as needed. For anxiety      . midodrine (PROAMATINE) 5 MG tablet Take 10 mg by mouth 3 (three) times a  week.       . sevelamer (RENAGEL) 800 MG tablet Take 3,200 mg by mouth 3 (three) times daily with meals.       . simvastatin (ZOCOR) 40 MG tablet Take 40 mg by mouth every evening.      . sorbitol 70 % solution Take 15 mLs by mouth daily as needed. For constipation      . warfarin (COUMADIN) 5 MG tablet Take 5 mg by mouth daily.      Marland Kitchen zolpidem (AMBIEN) 5 MG tablet Take 5 mg by mouth at bedtime as needed. sleep       Current Facility-Administered Medications on File Prior to Visit  Medication Dose Route Frequency Maybree Riling Last Rate Last Dose  . fentaNYL (SUBLIMAZE) injection   Intravenous PRN Art A Hoss, MD   25 mcg at 06/12/12 1049  . midazolam (VERSED) 5 MG/5ML injection   Intravenous PRN Art A Hoss, MD   1 mg at 06/12/12 1048    No Known Allergies  Review of Systems (Positive items checked otherwise negative)  General: [ ]  Weight loss, [ ]  Weight gain, [ ]   Loss of appetite, [ ]  Fever  Neurologic: [ ]  Dizziness, [ ]  Blackouts, [ ]  Headaches, [ ]  Seizure, [x]   blind  Ear/Nose/Throat: [ ]  Change in eyesight, [ ]  Change in hearing, [ ]  Nose bleeds, [ ]  Sore throat  Vascular: [ ]  Pain in legs with walking, [ ]  Pain in feet while lying flat, [ ]  Non-healing ulcer, [ ]  Stroke, [ ]  "Mini stroke", [ ]  Slurred speech, [ ]  Temporary blindness, [ ]  Blood clot in vein, [ ]  Phlebitis  Pulmonary: [ ]  Home oxygen, [ ]  Productive cough, [ ]  Bronchitis, [ ]  Coughing up blood, [ ]  Asthma, [ ]  Wheezing  Musculoskeletal: [ ]  Arthritis, [ ]  Joint pain, [ ]  Muscle pain, [x]  contractures due to NSF  Cardiac: [ ]  Chest pain, [ ]  Chest tightness/pressure, [ ]  Shortness of breath when lying flat, [ ]  Shortness of breath with exertion, [ ]  Palpitations, [ ]  Heart murmur, [ ]  Arrythmia,  [ ]  Atrial fibrillation  Hematologic: [ ]  Bleeding problems, [ ]  Clotting disorder, [ ]  Anemia  Psychiatric:  [ ]  Depression, [ ]  Anxiety, [ ]  Attention deficit disorder  Gastrointestinal:  [ ]  Black stool,[ ]   Blood in stool, [ ]  Peptic ulcer disease, [ ]  Reflux, [ ]  Hiatal hernia, [ ]  Trouble swallowing, [ ]  Diarrhea, [ ]  Constipation  Urinary:  [x]  Kidney disease, [ ]  Burning with urination, [ ]  Frequent urination, [ ]  Difficulty urinating  Skin: [ ]  Ulcers, [ ]  Rashes     Physical Examination  Filed Vitals:   01/07/13 1548  BP: 109/68  Pulse: 94  Height: 5' 7.5" (1.715 m)  Weight: 191 lb 12.8 oz (87 kg)  SpO2: 95%   Body mass index is 29.58 kg/(m^2).  General: A&O x 3, WDWN   Pulmonary: Sym exp, good air movt, CTAB, no rales, rhonchi, & wheezing   Cardiac: RRR, Nl S1, S2, no Murmurs, rubs or gallops   Gastrointestinal: soft, NTND, -G/R, - HSM, - masses, - CVAT B   Musculoskeletal: M/S 5/5 throughout , Extremities without ischemic changes , healed ulcer, palpable thrill in thigh AVG, good bruit   Neurologic: Pain and light touch intact in extremities , Motor exam as listed above   Medical Decision Making  [name] is a 47 y.o. male who presents with ESRD  requiring hemodialysis,  recurrent venous stenosis within thigh AVG..  The patient will need L thigh shuntogram and venoplasty of the venous anastomosis, to be completed in the hybrid OR for position needs which would make him a poor candidate in the PV lab. I discussed with the patient the nature of angiographic procedures, especially the limited patencies of any endovascular intervention.  The patient is aware of that the risks of an angiographic procedure include but are not limited to: bleeding, infection, access site complications, renal failure, embolization, rupture of vessel, dissection, possible need for emergent surgical intervention, possible need for surgical procedures to treat the patient's pathology, and stroke and death.   The patient is aware of the risks and agrees to proceed.  Leonides Sake, MD  [facility] [place]  Office: [phone]  Pager: [phone]

## 2013-01-10 ENCOUNTER — Other Ambulatory Visit: Payer: Self-pay | Admitting: *Deleted

## 2013-01-11 ENCOUNTER — Encounter (HOSPITAL_COMMUNITY): Payer: Self-pay | Admitting: Pharmacy Technician

## 2013-01-18 ENCOUNTER — Encounter (HOSPITAL_COMMUNITY): Payer: Self-pay | Admitting: *Deleted

## 2013-01-18 MED ORDER — DEXTROSE 5 % IV SOLN: 1.5000 g | INTRAVENOUS | Status: DC

## 2013-01-18 MED ORDER — SODIUM CHLORIDE 0.9 % IV SOLN: INTRAVENOUS | Status: DC

## 2013-01-18 MED ORDER — CEFUROXIME SODIUM 1.5 G IJ SOLR: 1.5000 g | INTRAMUSCULAR | Status: DC

## 2013-01-19 ENCOUNTER — Telehealth: Payer: Self-pay | Admitting: Vascular Surgery

## 2013-01-19 ENCOUNTER — Ambulatory Visit (HOSPITAL_COMMUNITY): Payer: Medicare Other | Admitting: Anesthesiology

## 2013-01-19 ENCOUNTER — Encounter (HOSPITAL_COMMUNITY): Payer: Self-pay | Admitting: Anesthesiology

## 2013-01-19 ENCOUNTER — Other Ambulatory Visit: Payer: Self-pay | Admitting: *Deleted

## 2013-01-19 ENCOUNTER — Encounter (HOSPITAL_COMMUNITY)
Admission: RE | Disposition: A | Discharge status: Home / Self Care | Payer: Self-pay | Source: Ambulatory Visit | Attending: Vascular Surgery

## 2013-01-19 ENCOUNTER — Encounter (HOSPITAL_COMMUNITY): Payer: Self-pay | Admitting: *Deleted

## 2013-01-19 ENCOUNTER — Ambulatory Visit (HOSPITAL_COMMUNITY)
Admission: RE | Admit: 2013-01-19 | Discharge: 2013-01-19 | Disposition: A | Discharge status: Home / Self Care | Payer: Medicare Other | Source: Ambulatory Visit | Attending: Vascular Surgery | Admitting: Vascular Surgery

## 2013-01-19 DIAGNOSIS — N186 End stage renal disease: Secondary | ICD-10-CM | POA: Insufficient documentation

## 2013-01-19 DIAGNOSIS — K219 Gastro-esophageal reflux disease without esophagitis: Secondary | ICD-10-CM | POA: Insufficient documentation

## 2013-01-19 DIAGNOSIS — Z8673 Personal history of transient ischemic attack (TIA), and cerebral infarction without residual deficits: Secondary | ICD-10-CM | POA: Insufficient documentation

## 2013-01-19 DIAGNOSIS — T82898A Other specified complication of vascular prosthetic devices, implants and grafts, initial encounter: Secondary | ICD-10-CM | POA: Insufficient documentation

## 2013-01-19 DIAGNOSIS — Z9861 Coronary angioplasty status: Secondary | ICD-10-CM | POA: Insufficient documentation

## 2013-01-19 DIAGNOSIS — H548 Legal blindness, as defined in USA: Secondary | ICD-10-CM | POA: Insufficient documentation

## 2013-01-19 DIAGNOSIS — I871 Compression of vein: Secondary | ICD-10-CM | POA: Insufficient documentation

## 2013-01-19 DIAGNOSIS — Z7982 Long term (current) use of aspirin: Secondary | ICD-10-CM | POA: Insufficient documentation

## 2013-01-19 DIAGNOSIS — Z7901 Long term (current) use of anticoagulants: Secondary | ICD-10-CM | POA: Insufficient documentation

## 2013-01-19 DIAGNOSIS — I120 Hypertensive chronic kidney disease with stage 5 chronic kidney disease or end stage renal disease: Secondary | ICD-10-CM | POA: Insufficient documentation

## 2013-01-19 DIAGNOSIS — F3289 Other specified depressive episodes: Secondary | ICD-10-CM | POA: Insufficient documentation

## 2013-01-19 DIAGNOSIS — Z79899 Other long term (current) drug therapy: Secondary | ICD-10-CM | POA: Insufficient documentation

## 2013-01-19 DIAGNOSIS — Z48812 Encounter for surgical aftercare following surgery on the circulatory system: Secondary | ICD-10-CM

## 2013-01-19 DIAGNOSIS — Z9049 Acquired absence of other specified parts of digestive tract: Secondary | ICD-10-CM | POA: Insufficient documentation

## 2013-01-19 DIAGNOSIS — I498 Other specified cardiac arrhythmias: Secondary | ICD-10-CM | POA: Insufficient documentation

## 2013-01-19 DIAGNOSIS — I2510 Atherosclerotic heart disease of native coronary artery without angina pectoris: Secondary | ICD-10-CM | POA: Insufficient documentation

## 2013-01-19 DIAGNOSIS — F329 Major depressive disorder, single episode, unspecified: Secondary | ICD-10-CM | POA: Insufficient documentation

## 2013-01-19 DIAGNOSIS — I252 Old myocardial infarction: Secondary | ICD-10-CM | POA: Insufficient documentation

## 2013-01-19 DIAGNOSIS — Z940 Kidney transplant status: Secondary | ICD-10-CM | POA: Insufficient documentation

## 2013-01-19 DIAGNOSIS — Y832 Surgical operation with anastomosis, bypass or graft as the cause of abnormal reaction of the patient, or of later complication, without mention of misadventure at the time of the procedure: Secondary | ICD-10-CM | POA: Insufficient documentation

## 2013-01-19 DIAGNOSIS — I739 Peripheral vascular disease, unspecified: Secondary | ICD-10-CM | POA: Insufficient documentation

## 2013-01-19 DIAGNOSIS — F411 Generalized anxiety disorder: Secondary | ICD-10-CM | POA: Insufficient documentation

## 2013-01-19 DIAGNOSIS — Z8701 Personal history of pneumonia (recurrent): Secondary | ICD-10-CM | POA: Insufficient documentation

## 2013-01-19 HISTORY — PX: FISTULOGRAM: SHX5832

## 2013-01-19 HISTORY — PX: ANGIOPLASTY: SHX39

## 2013-01-19 LAB — POCT I-STAT 4, (NA,K, GLUC, HGB,HCT)
Glucose, Bld: 91 mg/dL (ref 70–99)
HCT: 43 % (ref 39.0–52.0)
Hemoglobin: 14.6 g/dL (ref 13.0–17.0)
Potassium: 3.8 mEq/L (ref 3.5–5.1)

## 2013-01-19 LAB — APTT: aPTT: 38 seconds — ABNORMAL HIGH (ref 24–37)

## 2013-01-19 SURGERY — FISTULOGRAM
Anesthesia: General | Site: Leg Upper | Laterality: Left | Wound class: Clean

## 2013-01-19 MED ORDER — ONDANSETRON HCL 4 MG/2ML IJ SOLN: 4.0000 mg | Freq: Once | INTRAMUSCULAR | Status: DC | PRN

## 2013-01-19 MED ORDER — 0.9 % SODIUM CHLORIDE (POUR BTL) OPTIME
TOPICAL | Status: DC | PRN
  Administered 2013-01-19: 1000 mL

## 2013-01-19 MED ORDER — ATROPINE SULFATE 1 MG/ML IJ SOLN
INTRAMUSCULAR | Status: DC | PRN
  Administered 2013-01-19: 0.2 mg via INTRAVENOUS

## 2013-01-19 MED ORDER — GLYCOPYRROLATE 0.2 MG/ML IJ SOLN
INTRAMUSCULAR | Status: DC | PRN
  Administered 2013-01-19: .4 mg via INTRAVENOUS

## 2013-01-19 MED ORDER — IOHEXOL 300 MG/ML  SOLN
INTRAMUSCULAR | Status: DC | PRN
  Administered 2013-01-19: 50 mL via INTRAVENOUS

## 2013-01-19 MED ORDER — HYDROMORPHONE HCL PF 1 MG/ML IJ SOLN
0.2500 mg | INTRAMUSCULAR | Status: DC | PRN
  Administered 2013-01-19 (×3): 0.25 mg via INTRAVENOUS

## 2013-01-19 MED ORDER — VECURONIUM BROMIDE 10 MG IV SOLR
INTRAVENOUS | Status: DC | PRN
  Administered 2013-01-19: 3 mg via INTRAVENOUS

## 2013-01-19 MED ORDER — FENTANYL CITRATE 0.05 MG/ML IJ SOLN
INTRAMUSCULAR | Status: DC | PRN
  Administered 2013-01-19: 100 ug via INTRAVENOUS
  Administered 2013-01-19: 50 ug via INTRAVENOUS

## 2013-01-19 MED ORDER — PROPOFOL 10 MG/ML IV BOLUS
INTRAVENOUS | Status: DC | PRN
  Administered 2013-01-19: 200 mg via INTRAVENOUS
  Administered 2013-01-19: 50 mg via INTRAVENOUS

## 2013-01-19 MED ORDER — SODIUM CHLORIDE 0.9 % IR SOLN
Status: DC | PRN
  Administered 2013-01-19: 14:00:00

## 2013-01-19 MED ORDER — HYDROMORPHONE HCL PF 1 MG/ML IJ SOLN
INTRAMUSCULAR | Status: AC
  Filled 2013-01-19: qty 1

## 2013-01-19 MED ORDER — CEFAZOLIN SODIUM-DEXTROSE 2-3 GM-% IV SOLR
INTRAVENOUS | Status: AC
  Administered 2013-01-19: 2 g via INTRAVENOUS
  Filled 2013-01-19: qty 50

## 2013-01-19 MED ORDER — EPHEDRINE SULFATE 50 MG/ML IJ SOLN
INTRAMUSCULAR | Status: DC | PRN
  Administered 2013-01-19 (×2): 25 mg via INTRAVENOUS

## 2013-01-19 MED ORDER — PHENYLEPHRINE HCL 10 MG/ML IJ SOLN
INTRAMUSCULAR | Status: DC | PRN
  Administered 2013-01-19: 80 ug via INTRAVENOUS

## 2013-01-19 MED ORDER — SODIUM CHLORIDE 0.9 % IV SOLN
INTRAVENOUS | Status: DC | PRN
  Administered 2013-01-19 (×2): via INTRAVENOUS

## 2013-01-19 MED ORDER — HEPARIN SODIUM (PORCINE) 1000 UNIT/ML IJ SOLN
INTRAMUSCULAR | Status: DC | PRN
  Administered 2013-01-19: 3000 [IU] via INTRAVENOUS

## 2013-01-19 MED ORDER — LIDOCAINE HCL (CARDIAC) 20 MG/ML IV SOLN
INTRAVENOUS | Status: DC | PRN
  Administered 2013-01-19: 50 mg via INTRAVENOUS

## 2013-01-19 MED ORDER — SUCCINYLCHOLINE CHLORIDE 20 MG/ML IJ SOLN
INTRAMUSCULAR | Status: DC | PRN
  Administered 2013-01-19: 80 mg via INTRAVENOUS

## 2013-01-19 MED ORDER — DEXTROSE 5 % IV SOLN
1.5000 g | INTRAVENOUS | Status: DC
  Filled 2013-01-19: qty 1.5

## 2013-01-19 MED ORDER — MIDAZOLAM HCL 5 MG/5ML IJ SOLN
INTRAMUSCULAR | Status: DC | PRN
  Administered 2013-01-19: 2 mg via INTRAVENOUS

## 2013-01-19 MED ORDER — SODIUM CHLORIDE 0.9 % IV SOLN
INTRAVENOUS | Status: DC
  Administered 2013-01-19: 11:00:00 via INTRAVENOUS

## 2013-01-19 SURGICAL SUPPLY — 58 items
ADH SKN CLS APL DERMABOND .7 (GAUZE/BANDAGES/DRESSINGS) ×2
BAG BANDED W/RUBBER/TAPE 36X54 (MISCELLANEOUS) ×1 IMPLANT
BAG EQP BAND 135X91 W/RBR TAPE (MISCELLANEOUS) ×2
BALLN POWERFLEX 8 (BALLOONS)
BALLN POWERPLUS 9 (MISCELLANEOUS) IMPLANT
BALLOON POWERFLEX 8 (BALLOONS) IMPLANT
CANISTER SUCTION 2500CC (MISCELLANEOUS) ×3 IMPLANT
CATH STRAIGHT 5FR 65CM (CATHETERS) ×1 IMPLANT
CLOTH BEACON ORANGE TIMEOUT ST (SAFETY) ×3 IMPLANT
COVER SURGICAL LIGHT HANDLE (MISCELLANEOUS) ×3 IMPLANT
DERMABOND ADVANCED (GAUZE/BANDAGES/DRESSINGS) ×1
DERMABOND ADVANCED .7 DNX12 (GAUZE/BANDAGES/DRESSINGS) ×2 IMPLANT
DEVICE TORQUE H2O (MISCELLANEOUS) ×1 IMPLANT
DRSG OPSITE 4X5.5 SM (GAUZE/BANDAGES/DRESSINGS) ×2 IMPLANT
ELECT REM PT RETURN 9FT ADLT (ELECTROSURGICAL) ×3
ELECTRODE REM PT RTRN 9FT ADLT (ELECTROSURGICAL) ×2 IMPLANT
GEL ULTRASOUND 20GR AQUASONIC (MISCELLANEOUS) ×2 IMPLANT
GLOVE BIO SURGEON STRL SZ7 (GLOVE) ×3 IMPLANT
GLOVE BIOGEL PI IND STRL 7.0 (GLOVE) IMPLANT
GLOVE BIOGEL PI IND STRL 7.5 (GLOVE) ×2 IMPLANT
GLOVE BIOGEL PI INDICATOR 7.0 (GLOVE) ×1
GLOVE BIOGEL PI INDICATOR 7.5 (GLOVE) ×2
GLOVE ECLIPSE 8.0 STRL XLNG CF (GLOVE) ×1 IMPLANT
GLOVE SS BIOGEL STRL SZ 7 (GLOVE) ×1 IMPLANT
GLOVE SUPERSENSE BIOGEL SZ 7 (GLOVE) ×1
GOWN PREVENTION PLUS XLARGE (GOWN DISPOSABLE) ×2 IMPLANT
GOWN STRL NON-REIN LRG LVL3 (GOWN DISPOSABLE) ×6 IMPLANT
GUIDEWIRE ANGLED .035X150CM (WIRE) ×1 IMPLANT
INTRODUCER COOK 11FR (CATHETERS) IMPLANT
INTRODUCER SET COOK 14FR (MISCELLANEOUS) IMPLANT
KIT BASIN OR (CUSTOM PROCEDURE TRAY) ×3 IMPLANT
KIT ENCORE 26 ADVANTAGE (KITS) ×3 IMPLANT
KIT ROOM TURNOVER OR (KITS) ×3 IMPLANT
NDL PERC 18GX7CM (NEEDLE) ×2 IMPLANT
NEEDLE PERC 18GX7CM (NEEDLE) ×3 IMPLANT
NS IRRIG 1000ML POUR BTL (IV SOLUTION) ×3 IMPLANT
PACK CV ACCESS (CUSTOM PROCEDURE TRAY) ×3 IMPLANT
PAD ARMBOARD 7.5X6 YLW CONV (MISCELLANEOUS) ×7 IMPLANT
SET INTRODUCER 12FR PACEMAKER (SHEATH) IMPLANT
SET MICROPUNCTURE 5F STIFF (MISCELLANEOUS) ×2 IMPLANT
SHEATH AVANTI 11CM 5FR (MISCELLANEOUS) ×2 IMPLANT
SHEATH BRITE TIP 7FRX11 (SHEATH) ×2 IMPLANT
SPONGE GAUZE 4X4 12PLY (GAUZE/BANDAGES/DRESSINGS) ×2 IMPLANT
STOPCOCK 4 WAY LG BORE MALE ST (IV SETS) ×1 IMPLANT
SUT MNCRL AB 4-0 PS2 18 (SUTURE) ×2 IMPLANT
SUT PROLENE 6 0 BV (SUTURE) ×2 IMPLANT
SUT SILK 2 0 FS (SUTURE) IMPLANT
SUT VIC AB 3-0 SH 27 (SUTURE)
SUT VIC AB 3-0 SH 27X BRD (SUTURE) ×1 IMPLANT
SYR 20CC LL (SYRINGE) ×3 IMPLANT
TAPE CLOTH SURG 4X10 WHT LF (GAUZE/BANDAGES/DRESSINGS) ×3 IMPLANT
TOWEL OR 17X24 6PK STRL BLUE (TOWEL DISPOSABLE) ×3 IMPLANT
TOWEL OR 17X26 10 PK STRL BLUE (TOWEL DISPOSABLE) ×3 IMPLANT
TUBING EXTENTION W/L.L. (IV SETS) ×1 IMPLANT
UNDERPAD 30X30 INCONTINENT (UNDERPADS AND DIAPERS) ×3 IMPLANT
WATER STERILE IRR 1000ML POUR (IV SOLUTION) ×3 IMPLANT
WIRE BENTSON .035X145CM (WIRE) ×4 IMPLANT
WIRE COONS/BENSON .038X145CM (WIRE) ×1 IMPLANT

## 2013-01-19 NOTE — H&P (View-Only) (Signed)
[facility] [place]   Established Dialysis Access   History of Present Illness  [name] is a 47 y.o. (April 26, 1966) male who presents for re-evaluation of his left thigh arteriovenous graft.  His prior erosion has healed.  He denies any significant flow problems with the fistula.  He denies any bleeding complications.  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  His percutaneous procedures have been complicated with prior rupture requiring a covered stent placement.  His access duplex demonstrated some stenosis at his last visit.  I delayed intervention to allow him time to heal an erosion adjacent to a segment of the graft.  Past Medical History  Diagnosis Date  . Blindness   . Anxiety   . Hypertension   . Depression   . GERD (gastroesophageal reflux disease)   . Pneumonia   . Complication of anesthesia     "Wake up in the middle of procedure."  . Dysrhythmia   . Stroke 2003    Legally Blind  . Chronic kidney disease     Tues, Thurs, Sat  . History of blood transfusion   . Myocardial infarction 2000    no cardiologist-    Past Surgical History  Procedure Laterality Date  . Arteriovenous graft placement  11-07-1998    right upper extremity   . Arteriovenous graft placement  10-18-1998    right upper extremity  . Cardiac catheterization      Stents  . Arteriovenous graft placement      Right arm x 2  . Artreriovenous      Graft Right leg  . 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  . Peritoneal catheter insertion    . Peritoneal catheter removal    . Colon resection      due to peritonitis  . Nephrostomy      left  . Sp declot avgg  04/19/12    Left Thigh    History   Social History  . Marital Status: Single    Spouse Name: N/A    Number of Children: N/A  . Years of Education: N/A   Occupational History  . Not on file.   Social History Main  Topics  . Smoking status: Never Smoker   . Smokeless tobacco: Never Used  . Alcohol Use: No  . Drug Use: No  . Sexually Active: Not Currently   Other Topics Concern  . Not on file   Social History Narrative  . No narrative on file    Family History  Problem Relation Age of Onset  . Cancer Father     Lung    Current Outpatient Prescriptions on File Prior to Visit  Medication Sig Dispense Refill  . aspirin EC 81 MG tablet Take 81 mg by mouth daily.      Marland Kitchen b complex-vitamin c-folic acid (NEPHRO-VITE) 0.8 MG TABS Take 0.8 mg by mouth daily.      . famotidine (PEPCID) 20 MG tablet Take 20 mg by mouth daily.      . hydrOXYzine (ATARAX/VISTARIL) 25 MG tablet Take 25 mg by mouth every 6 (six) hours as needed. For itching      . LORazepam (ATIVAN) 2 MG tablet Take 2 mg by mouth 2 (two) times daily as needed. For anxiety      . midodrine (PROAMATINE) 5 MG tablet Take 10 mg by mouth 3 (three) times a  week.       . sevelamer (RENAGEL) 800 MG tablet Take 3,200 mg by mouth 3 (three) times daily with meals.       . simvastatin (ZOCOR) 40 MG tablet Take 40 mg by mouth every evening.      . sorbitol 70 % solution Take 15 mLs by mouth daily as needed. For constipation      . warfarin (COUMADIN) 5 MG tablet Take 5 mg by mouth daily.      Marland Kitchen zolpidem (AMBIEN) 5 MG tablet Take 5 mg by mouth at bedtime as needed. sleep       Current Facility-Administered Medications on File Prior to Visit  Medication Dose Route Frequency Provider Last Rate Last Dose  . fentaNYL (SUBLIMAZE) injection   Intravenous PRN Art A Hoss, MD   25 mcg at 06/12/12 1049  . midazolam (VERSED) 5 MG/5ML injection   Intravenous PRN Art A Hoss, MD   1 mg at 06/12/12 1048    No Known Allergies  Review of Systems (Positive items checked otherwise negative)  General: [ ]  Weight loss, [ ]  Weight gain, [ ]   Loss of appetite, [ ]  Fever  Neurologic: [ ]  Dizziness, [ ]  Blackouts, [ ]  Headaches, [ ]  Seizure, [x]   blind  Ear/Nose/Throat: [ ]  Change in eyesight, [ ]  Change in hearing, [ ]  Nose bleeds, [ ]  Sore throat  Vascular: [ ]  Pain in legs with walking, [ ]  Pain in feet while lying flat, [ ]  Non-healing ulcer, [ ]  Stroke, [ ]  "Mini stroke", [ ]  Slurred speech, [ ]  Temporary blindness, [ ]  Blood clot in vein, [ ]  Phlebitis  Pulmonary: [ ]  Home oxygen, [ ]  Productive cough, [ ]  Bronchitis, [ ]  Coughing up blood, [ ]  Asthma, [ ]  Wheezing  Musculoskeletal: [ ]  Arthritis, [ ]  Joint pain, [ ]  Muscle pain, [x]  contractures due to NSF  Cardiac: [ ]  Chest pain, [ ]  Chest tightness/pressure, [ ]  Shortness of breath when lying flat, [ ]  Shortness of breath with exertion, [ ]  Palpitations, [ ]  Heart murmur, [ ]  Arrythmia,  [ ]  Atrial fibrillation  Hematologic: [ ]  Bleeding problems, [ ]  Clotting disorder, [ ]  Anemia  Psychiatric:  [ ]  Depression, [ ]  Anxiety, [ ]  Attention deficit disorder  Gastrointestinal:  [ ]  Black stool,[ ]   Blood in stool, [ ]  Peptic ulcer disease, [ ]  Reflux, [ ]  Hiatal hernia, [ ]  Trouble swallowing, [ ]  Diarrhea, [ ]  Constipation  Urinary:  [x]  Kidney disease, [ ]  Burning with urination, [ ]  Frequent urination, [ ]  Difficulty urinating  Skin: [ ]  Ulcers, [ ]  Rashes     Physical Examination  Filed Vitals:   01/07/13 1548  BP: 109/68  Pulse: 94  Height: 5' 7.5" (1.715 m)  Weight: 191 lb 12.8 oz (87 kg)  SpO2: 95%   Body mass index is 29.58 kg/(m^2).  General: A&O x 3, WDWN   Pulmonary: Sym exp, good air movt, CTAB, no rales, rhonchi, & wheezing   Cardiac: RRR, Nl S1, S2, no Murmurs, rubs or gallops   Gastrointestinal: soft, NTND, -G/R, - HSM, - masses, - CVAT B   Musculoskeletal: M/S 5/5 throughout , Extremities without ischemic changes , healed ulcer, palpable thrill in thigh AVG, good bruit   Neurologic: Pain and light touch intact in extremities , Motor exam as listed above   Medical Decision Making  [name] is a 47 y.o. male who presents with ESRD  requiring hemodialysis,  recurrent venous stenosis within thigh AVG..  The patient will need L thigh shuntogram and venoplasty of the venous anastomosis, to be completed in the hybrid OR for position needs which would make him a poor candidate in the PV lab. I discussed with the patient the nature of angiographic procedures, especially the limited patencies of any endovascular intervention.  The patient is aware of that the risks of an angiographic procedure include but are not limited to: bleeding, infection, access site complications, renal failure, embolization, rupture of vessel, dissection, possible need for emergent surgical intervention, possible need for surgical procedures to treat the patient's pathology, and stroke and death.   The patient is aware of the risks and agrees to proceed.  Leonides Sake, MD  [facility] [place]  Office: [phone]  Pager: [phone]

## 2013-01-19 NOTE — Preoperative (Signed)
Beta Blockers   Reason not to administer Beta Blockers:Not Applicable 

## 2013-01-19 NOTE — Anesthesia Preprocedure Evaluation (Addendum)
Anesthesia Evaluation  Patient identified by MRN, date of birth, ID band Patient awake    Reviewed: Allergy & Precautions, H&P , NPO status , Patient's Chart, lab work & pertinent test results  Airway Mallampati: II TM Distance: >3 FB Neck ROM: full    Dental  (+) Teeth Intact and Dental Advidsory Given   Pulmonary          Cardiovascular hypertension, On Medications + Past MI and + Peripheral Vascular Disease + dysrhythmias Rhythm:regular Rate:Normal     Neuro/Psych CVA, Residual Symptoms    GI/Hepatic GERD-  ,  Endo/Other    Renal/GU ESRF and DialysisRenal disease     Musculoskeletal   Abdominal   Peds  Hematology   Anesthesia Other Findings   Reproductive/Obstetrics                          Anesthesia Physical Anesthesia Plan  ASA: III  Anesthesia Plan: General   Post-op Pain Management:    Induction: Intravenous  Airway Management Planned: Oral ETT  Additional Equipment:   Intra-op Plan:   Post-operative Plan: Extubation in OR  Informed Consent: I have reviewed the patients History and Physical, chart, labs and discussed the procedure including the risks, benefits and alternatives for the proposed anesthesia with the patient or authorized representative who has indicated his/her understanding and acceptance.   Dental Advisory Given  Plan Discussed with: CRNA, Anesthesiologist and Surgeon  Anesthesia Plan Comments:        Anesthesia Quick Evaluation

## 2013-01-19 NOTE — Telephone Encounter (Signed)
notified pateitn of fu appt. with access duplex of left thigh avg on 04-22-13 on 3pm as per staff message on 01-20-13

## 2013-01-19 NOTE — Anesthesia Procedure Notes (Signed)
Procedure Name: Intubation Date/Time: 01/19/2013 1:30 PM Performed by: Carmela Rima Pre-anesthesia Checklist: Patient identified, Emergency Drugs available, Suction available, Timeout performed and Patient being monitored Patient Re-evaluated:Patient Re-evaluated prior to inductionOxygen Delivery Method: Circle system utilized Preoxygenation: Pre-oxygenation with 100% oxygen Intubation Type: IV induction Ventilation: Mask ventilation without difficulty Laryngoscope Size: Mac and 3 Grade View: Grade III Tube type: Oral Tube size: 7.5 mm Placement Confirmation: positive ETCO2 and CO2 detector Secured at: 23 cm Tube secured with: Tape Dental Injury: Teeth and Oropharynx as per pre-operative assessment

## 2013-01-19 NOTE — Op Note (Signed)
OPERATIVE NOTE   PROCEDURE: 1.  left thigh arteriovenous graft cannulation under ultrasound guidance 2.  left thigh shuntogram 3.  Venoplasty of femoral vein (6 mm x 80 mm)  PRE-OPERATIVE DIAGNOSIS: Malfunctioning left thigh arteriovenous graft  POST-OPERATIVE DIAGNOSIS: same as above   SURGEON: Leonides Sake, MD  ANESTHESIA: local  ESTIMATED BLOOD LOSS: 5 cc  FINDING(S): 1. Patent common femoral vein and distal left external iliac vein 2. Venous anastomosis with >75% stenosis: resolved after venoplasty 3. Improved thrill at end of case 4. Patent arterial arm.  Unable to full reflux into arterial anastomosis.  SPECIMEN(S):  None  CONTRAST: 30 cc  INDICATIONS: [name] is a 47 y.o. male who presents with malfunctioning left thigh arteriovenous graft.  The patient is scheduled for left thigh shuntogram, possible intervention.  The patient is aware the risks include but are not limited to: bleeding, infection, thrombosis of the cannulated access, and possible anaphylactic reaction to the contrast.  The patient is aware of the risks of the procedure and elects to proceed forward.  DESCRIPTION: After full informed written consent was obtained, the patient was brought back to the angiography suite and placed supine upon the angiography table.  The patient was connected to monitoring equipment.  The left thigh was prepped and draped in the standard fashion for a percutaneous access intervention.  Under ultrasound guidance, the left thigh arteriovenous graft was cannulated with a micropuncture needle.  The microwire was advanced into the fistula and the needle was exchanged for the a microsheath, which was lodged 2 cm into the access.  The wire was removed and the sheath was connected to the IV extension tubing.  Hand injections were completed to image the access from the level of the sheath to the venous anastomosis.   Based on the images, this patient will need: venoplasty of the venous  anastomosis.  A Benson wire was advanced into the iliac vein and the sheath was exchanged for a 5-Fr sheath.  Based on the the imaging, a 6 mm x 80 mm angioplasty balloon was selected.  The balloon was centered around the stenosis and inflated to 18 atm for 2 minutes.  On completion imaging, the stenosis had resolved completely.  I tried a reflux injection while the balloon was up but the balloon was too occlusive to do such.  I then compressed the outflow while injection via the side port of the sheath.  The arterial arm was found to be patent but I could not get the contrast to reflux into the anastomosis.  Based on exam, the thrill was greatly improved.  Based on the completion imaging, no further intervention is necessary.  The wire and balloon were removed from the sheath.  A 4-0 Monocryl purse-string suture was sewn around the sheath.  The sheath was removed while tying down the suture.  A sterile bandage was applied to the puncture site.  COMPLICATIONS: none  CONDITION: stable  Leonides Sake, MD [facility] [place] Office: [phone] Pager: [phone]  01/19/2013 2:34 PM

## 2013-01-19 NOTE — Anesthesia Postprocedure Evaluation (Signed)
  Anesthesia Post-op Note  Patient: [name]  Procedure(s) Performed: Procedure(s) with comments: FISTULOGRAM (Left) - SHUNTOGRAM Balloon Angioplasty (Left) - Left thigh fistulogram and balloon angioplasty  Patient Location: PACU  Anesthesia Type:General  Level of Consciousness: awake, oriented and patient cooperative  Airway and Oxygen Therapy: Patient Spontanous Breathing  Post-op Pain: mild  Post-op Assessment: Post-op Vital signs reviewed, Patient's Cardiovascular Status Stable, Respiratory Function Stable, Patent Airway, No signs of Nausea or vomiting and Pain level controlled  Post-op Vital Signs: stable  Complications: No apparent anesthesia complications

## 2013-01-19 NOTE — Transfer of Care (Signed)
Immediate Anesthesia Transfer of Care Note  Patient: [name]  Procedure(s) Performed: Procedure(s) with comments: FISTULOGRAM (Left) - SHUNTOGRAM Balloon Angioplasty (Left) - Left thigh fistulogram and balloon angioplasty  Patient Location: PACU  Anesthesia Type:General  Level of Consciousness: awake, alert  and oriented  Airway & Oxygen Therapy: Patient Spontanous Breathing and Patient connected to nasal cannula oxygen  Post-op Assessment: Report given to PACU RN, Post -op Vital signs reviewed and stable and Patient moving all extremities  Post vital signs: Reviewed and stable  Complications: No apparent anesthesia complications

## 2013-01-19 NOTE — Interval H&P Note (Signed)
[facility] [place]  History and Physical Update  The patient was interviewed and re-examined.  The patient's previous History and Physical has been reviewed and is unchanged.  There is no change in the plan of care: L thigh AVG shuntogram, possible intervention.  Leonides Sake, MD [facility] [place] Office: [phone] Pager: [phone]  01/19/2013, 12:46 PM

## 2013-01-20 ENCOUNTER — Other Ambulatory Visit: Payer: Self-pay | Admitting: *Deleted

## 2013-01-20 DIAGNOSIS — Z48812 Encounter for surgical aftercare following surgery on the circulatory system: Secondary | ICD-10-CM

## 2013-01-20 DIAGNOSIS — N186 End stage renal disease: Secondary | ICD-10-CM

## 2013-01-27 ENCOUNTER — Encounter (HOSPITAL_COMMUNITY): Payer: Self-pay | Admitting: Vascular Surgery

## 2013-03-09 ENCOUNTER — Ambulatory Visit: Payer: Medicare Other | Admitting: Cardiovascular Disease

## 2013-04-21 ENCOUNTER — Encounter: Payer: Self-pay | Admitting: Vascular Surgery

## 2013-04-22 ENCOUNTER — Ambulatory Visit: Payer: Medicare Other | Admitting: Vascular Surgery

## 2013-06-10 ENCOUNTER — Other Ambulatory Visit: Payer: Self-pay | Admitting: *Deleted

## 2013-06-10 DIAGNOSIS — T82598A Other mechanical complication of other cardiac and vascular devices and implants, initial encounter: Secondary | ICD-10-CM

## 2013-06-28 ENCOUNTER — Encounter: Payer: Self-pay | Admitting: Vascular Surgery

## 2013-06-29 ENCOUNTER — Ambulatory Visit: Payer: Medicare Other | Admitting: Vascular Surgery

## 2013-07-28 ENCOUNTER — Other Ambulatory Visit: Payer: Self-pay | Admitting: *Deleted

## 2013-07-28 DIAGNOSIS — T82598A Other mechanical complication of other cardiac and vascular devices and implants, initial encounter: Secondary | ICD-10-CM

## 2013-07-29 ENCOUNTER — Encounter (HOSPITAL_COMMUNITY): Payer: Self-pay | Admitting: Physical Medicine and Rehabilitation

## 2013-07-29 ENCOUNTER — Emergency Department (HOSPITAL_COMMUNITY)
Admission: EM | Admit: 2013-07-29 | Discharge: 2013-07-29 | Disposition: A | Discharge status: Home / Self Care | Payer: Medicare Other | Attending: Emergency Medicine | Admitting: Emergency Medicine

## 2013-07-29 DIAGNOSIS — IMO0001 Reserved for inherently not codable concepts without codable children: Secondary | ICD-10-CM | POA: Insufficient documentation

## 2013-07-29 DIAGNOSIS — I252 Old myocardial infarction: Secondary | ICD-10-CM | POA: Insufficient documentation

## 2013-07-29 DIAGNOSIS — F329 Major depressive disorder, single episode, unspecified: Secondary | ICD-10-CM | POA: Insufficient documentation

## 2013-07-29 DIAGNOSIS — Z992 Dependence on renal dialysis: Secondary | ICD-10-CM | POA: Insufficient documentation

## 2013-07-29 DIAGNOSIS — T82898A Other specified complication of vascular prosthetic devices, implants and grafts, initial encounter: Secondary | ICD-10-CM

## 2013-07-29 DIAGNOSIS — Z79899 Other long term (current) drug therapy: Secondary | ICD-10-CM | POA: Insufficient documentation

## 2013-07-29 DIAGNOSIS — F411 Generalized anxiety disorder: Secondary | ICD-10-CM | POA: Insufficient documentation

## 2013-07-29 DIAGNOSIS — Z8719 Personal history of other diseases of the digestive system: Secondary | ICD-10-CM | POA: Insufficient documentation

## 2013-07-29 DIAGNOSIS — Z8679 Personal history of other diseases of the circulatory system: Secondary | ICD-10-CM | POA: Insufficient documentation

## 2013-07-29 DIAGNOSIS — I120 Hypertensive chronic kidney disease with stage 5 chronic kidney disease or end stage renal disease: Secondary | ICD-10-CM | POA: Insufficient documentation

## 2013-07-29 DIAGNOSIS — Z8701 Personal history of pneumonia (recurrent): Secondary | ICD-10-CM | POA: Insufficient documentation

## 2013-07-29 DIAGNOSIS — Z7901 Long term (current) use of anticoagulants: Secondary | ICD-10-CM | POA: Insufficient documentation

## 2013-07-29 DIAGNOSIS — M7989 Other specified soft tissue disorders: Secondary | ICD-10-CM

## 2013-07-29 DIAGNOSIS — Z7982 Long term (current) use of aspirin: Secondary | ICD-10-CM | POA: Insufficient documentation

## 2013-07-29 DIAGNOSIS — F3289 Other specified depressive episodes: Secondary | ICD-10-CM | POA: Insufficient documentation

## 2013-07-29 DIAGNOSIS — H543 Unqualified visual loss, both eyes: Secondary | ICD-10-CM | POA: Insufficient documentation

## 2013-07-29 DIAGNOSIS — N186 End stage renal disease: Secondary | ICD-10-CM | POA: Insufficient documentation

## 2013-07-29 DIAGNOSIS — Z8673 Personal history of transient ischemic attack (TIA), and cerebral infarction without residual deficits: Secondary | ICD-10-CM | POA: Insufficient documentation

## 2013-07-29 LAB — COMPREHENSIVE METABOLIC PANEL
AST: 19 U/L (ref 0–37)
Albumin: 3.4 g/dL — ABNORMAL LOW (ref 3.5–5.2)
BUN: 19 mg/dL (ref 6–23)
Calcium: 9.5 mg/dL (ref 8.4–10.5)
Chloride: 96 mEq/L (ref 96–112)
Creatinine, Ser: 5.14 mg/dL — ABNORMAL HIGH (ref 0.50–1.35)
Total Bilirubin: 0.3 mg/dL (ref 0.3–1.2)
Total Protein: 8.6 g/dL — ABNORMAL HIGH (ref 6.0–8.3)

## 2013-07-29 LAB — CBC WITH DIFFERENTIAL/PLATELET
Basophils Absolute: 0 10*3/uL (ref 0.0–0.1)
Basophils Relative: 0 % (ref 0–1)
Eosinophils Absolute: 0.6 10*3/uL (ref 0.0–0.7)
HCT: 30.3 % — ABNORMAL LOW (ref 39.0–52.0)
MCH: 28.9 pg (ref 26.0–34.0)
MCHC: 31.7 g/dL (ref 30.0–36.0)
Monocytes Absolute: 0.7 10*3/uL (ref 0.1–1.0)
Neutro Abs: 6 10*3/uL (ref 1.7–7.7)
RDW: 14.8 % (ref 11.5–15.5)

## 2013-07-29 LAB — PROTIME-INR: INR: 2 — ABNORMAL HIGH (ref 0.00–1.49)

## 2013-07-29 LAB — APTT: aPTT: 56 seconds — ABNORMAL HIGH (ref 24–37)

## 2013-07-29 MED ORDER — OXYCODONE-ACETAMINOPHEN 5-325 MG PO TABS
1.0000 | ORAL_TABLET | Freq: Once | ORAL | Status: AC
  Administered 2013-07-29: 1 via ORAL
  Filled 2013-07-29: qty 1

## 2013-07-29 MED ORDER — OXYCODONE-ACETAMINOPHEN 5-325 MG PO TABS: 1.0000 | ORAL_TABLET | Freq: Three times a day (TID) | ORAL | Status: DC | PRN

## 2013-07-29 MED ORDER — MORPHINE SULFATE 4 MG/ML IJ SOLN
4.0000 mg | Freq: Once | INTRAMUSCULAR | Status: AC
  Administered 2013-07-29: 4 mg via INTRAVENOUS
  Filled 2013-07-29: qty 1

## 2013-07-29 NOTE — ED Notes (Signed)
Pt here from home c/o left leg pain , pt had his dialysis yesterday , access is in the left leg , no swelling noted , pt has a appointment with vascular on the 17th of this month

## 2013-07-29 NOTE — Consult Note (Signed)
[facility] Consult  Reason for Consult:  Swelling around left thigh graft Referring Physician:  ER  [id number]  History of Present Illness: This is a 47 y.o. male who presented to the ED today for swelling around his left thigh graft.  He states that this started 2-3 weeks ago after it infiltrated during HD treatment.  Dr. Darrick Penna called the HD center and they stated that his INR was somewhat elevated and he bled after his treatment.   He denies any fever or chills.  Past Medical History  Diagnosis Date  . Blindness   . Anxiety   . Hypertension   . Depression   . GERD (gastroesophageal reflux disease)   . Pneumonia   . Complication of anesthesia     "Wake up in the middle of procedure."  . Dysrhythmia   . Stroke 2003    Legally Blind  . Chronic kidney disease     Tues, Thurs, Sat  . History of blood transfusion   . Myocardial infarction 2000    no cardiologist-   Past Surgical History  Procedure Laterality Date  . Arteriovenous graft placement  11-07-1998    right upper extremity   . Arteriovenous graft placement  10-18-1998    right upper extremity  . Cardiac catheterization      Stents  . Arteriovenous graft placement      Right arm x 2  . Artreriovenous      Graft Right leg  . 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  . Peritoneal catheter insertion    . Peritoneal catheter removal    . Colon resection      due to peritonitis  . Nephrostomy      left  . Sp declot avgg  04/19/12    Left Thigh  . Fistulogram Left 01/19/2013    Procedure: FISTULOGRAM;  Surgeon: Fransisco Hertz, MD;  Location: [facility] OR;  Service: Vascular;  Laterality: Left;  SHUNTOGRAM  . Angioplasty Left 01/19/2013    Procedure: Balloon Angioplasty;  Surgeon: Fransisco Hertz, MD;  Location: [facility] OR;  Service: Vascular;  Laterality: Left;  Left thigh fistulogram and balloon angioplasty    No Known Allergies  Prior to Admission medications   Medication Sig Start Date End  Date Taking? Authorizing Provider  aspirin EC 81 MG tablet Take 81 mg by mouth daily.   Yes Historical Provider, MD  aspirin-acetaminophen-caffeine (EXCEDRIN MIGRAINE) [phone] MG per tablet Take 2-3 tablets by mouth every 6 (six) hours as needed for pain.   Yes Historical Provider, MD  b complex-vitamin c-folic acid (NEPHRO-VITE) 0.8 MG TABS Take 0.8 mg by mouth daily.   Yes Historical Provider, MD  famotidine (PEPCID) 20 MG tablet Take 20 mg by mouth daily.   Yes Historical Provider, MD  hydrOXYzine (ATARAX/VISTARIL) 25 MG tablet Take 25 mg by mouth every 6 (six) hours as needed. For itching   Yes Historical Provider, MD  LORazepam (ATIVAN) 2 MG tablet Take 2 mg by mouth 2 (two) times daily as needed. For anxiety   Yes Historical Provider, MD  midodrine (PROAMATINE) 5 MG tablet Take 10 mg by mouth 3 (three) times a week.    Yes Historical Provider, MD  sevelamer (RENAGEL) 800 MG tablet Take 3,200 mg by mouth 3 (three) times daily with meals.    Yes Historical Provider, MD  simvastatin (ZOCOR) 40 MG tablet Take 40 mg by mouth every evening.   Yes Historical Provider, MD  warfarin (COUMADIN) 5 MG tablet Take 5 mg by mouth daily.   Yes Historical Provider, MD  zolpidem (AMBIEN) 5 MG tablet Take 5 mg by mouth at bedtime as needed for sleep.    Yes Historical Provider, MD    Statin:  yes Beta Blocker:  no Aspirin:  yes ACEI:  no ARB:  no Other antiplatelets/anticoagulants:  yes  History   Social History  . Marital Status: Single    Spouse Name: N/A    Number of Children: N/A  . Years of Education: N/A   Occupational History  . Not on file.   Social History Main Topics  . Smoking status: Never Smoker   . Smokeless tobacco: Never Used  . Alcohol Use: No  . Drug Use: No  . Sexual Activity: Not Currently   Other Topics Concern  . Not on file   Social History Narrative  . No narrative on file     Family History  Problem Relation Age of Onset  . Cancer Father     Lung     ROS: [x]  Positive   [ ]  Negative   [ ]  All sytems reviewed and are negative  Cardiovascular: []  chest pain/pressure []  palpitations []  SOB lying flat []  DOE []  pain in legs while walking []  pain in legs at rest []  pain in legs at night []  non-healing ulcers []  hx of DVT []  swelling in legs   Pulmonary: []  productive cough []  asthma/wheezing []  home O2  Neurologic: []  weakness in []  arms []  legs []  numbness in []  arms []  legs []  hx of CVA []  mini stroke [] difficulty speaking or slurred speech []  temporary loss of vision in one eye []  dizziness  Hematologic: []  hx of cancer []  bleeding problems []  problems with blood clotting easily  Endocrine:   []  diabetes []  thyroid disease  GI []  vomiting blood []  blood in stool  GU: [x]  CKD/renal failure [x]  HD--[]  M/W/F or [x]  T/T/S [x]  swelling around left thigh graft []  burning with urination []  blood in urine  Psychiatric: []  anxiety []  depression  Musculoskeletal: []  arthritis []  joint pain  Integumentary: []  rashes []  ulcers  Constitutional: []  fever-denies []  chills-denies   Physical Examination  Filed Vitals:   07/29/13 1116  BP: 122/73  Pulse: 97  Temp: 98.4 F (36.9 C)  Resp: 28   There is no weight on file to calculate BMI.  General:  WDWN in NAD Gait: Not observed HENT: WNL, normocephalic Eyes: Pupils equal Pulmonary: normal non-labored breathing, without Rales, rhonchi,  wheezing Skin: without rashes, without ulcers  Vascular Exam/Pulses:+ doppler signal within the left thigh graft; there is some swelling around the left thigh graft Extremities: without ischemic changes, without Gangrene , without cellulitis; without open wounds;  Musculoskeletal: no muscle wasting or atrophy.  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  Neurologic: A&O X 3; Appropriate Affect ; SENSATION: normal; MOTOR FUNCTION:  moving all extremities equally. Speech is fluent/normal  CBC     Component Value Date/Time   WBC 8.1 07/29/2013 1108   RBC 3.32* 07/29/2013 1108   HGB 9.6* 07/29/2013 1108   HCT 30.3* 07/29/2013 1108   PLT 210 07/29/2013 1108   MCV 91.3 07/29/2013 1108   MCH 28.9 07/29/2013 1108   MCHC 31.7 07/29/2013 1108   RDW 14.8 07/29/2013 1108   LYMPHSABS 0.8 07/29/2013 1108   MONOABS 0.7 07/29/2013 1108   EOSABS 0.6 07/29/2013 1108   BASOSABS 0.0 07/29/2013 1108    BMET  Component Value Date/Time   NA 137 07/29/2013 1108   K 4.0 07/29/2013 1108   CL 96 07/29/2013 1108   CO2 25 07/29/2013 1108   GLUCOSE 82 07/29/2013 1108   BUN 19 07/29/2013 1108   CREATININE 5.14* 07/29/2013 1108   CALCIUM 9.5 07/29/2013 1108   GFRNONAA 12* 07/29/2013 1108   GFRAA 14* 07/29/2013 1108    COAGS: Lab Results  Component Value Date   INR 1.22 01/19/2013   INR 1.64* 06/12/2012   INR 2.02* 05/24/2012     Non-Invasive Vascular Imaging:  none   ASSESSMENT: This is a 47 y.o. male with probable hematoma over left thigh graft from infiltration a couple of weeks ago, however, infection cannot be ruled out.  PLAN: -discharge to home today.  Pt will call or f/u in ER if he develops fever or chills. -Pt is on optimal ABx at HD for this-continue -f/u with [name] on 08/15/13 at 9:20 with [name] for check of his left thigh graft. (dialyzes T/T/S) -Will continue to monitor as the pt's options for access are limited as he has had a right thigh graft in the past.    Doreatha Massed, PA-C [facility] [phone]   History and exam details as above Subcutaneous hematoma lateral aspect of thigh graft, no skin compromise or erythema, non pulsatile Pt currently on Vanc/ceftaz on dialysis Will arrange follow up in 2 weeks to check for resolution.  Discussed with pt and family signs and symptoms of infection.  Fabienne Bruns, MD [facility] [place] Office: [phone] Pager: [phone]

## 2013-07-29 NOTE — ED Provider Notes (Signed)
CSN: [id number]     Arrival date & time 07/29/13  1001 History   First MD Initiated Contact with Patient 07/29/13 1011     Chief Complaint  Patient presents with  . Leg Pain   (Consider location/radiation/quality/duration/timing/severity/associated sxs/prior Treatment) The history is provided by the patient. No language interpreter was used.  [name] is a 47 year old male with past medical history of blindness, anxiety, hypertension, depression, chronic kidney disease on dialysis (dialysis on Tuesday, Thursday, Saturday) presenting to emergency department with left upper thigh swelling. Patient reports he had an arteriovenous graft placed approximately one year ago to the left leg, reported that over the past 3 weeks he noticed that there has been swelling to the anterior aspect of the left upper thigh. Patient reports that the discomfort is of a tightness sensation, soreness to touch. Patient reports he feels mild warmth upon palpation. Patient reports that the pain worsens when going from a sitting to standing position and when applying pressure, reports the pain feels better when a pillow is lying underneath that when he applies ice to the site. Patient reports he's been using Excedrin with minimal relief. Patient reports that he does have mild weakness. Patient reports on Tuesday while he was at dialysis the technician noted drainage from the site, reported that a culture was obtained. Reported that he has an appointment with Vascular on 08/12/2013. Patient denied fever, chills, numbness, tingling, loss sensation, chest pain, shortness of breath, difficulty breathing. PCP Dr. Darrick Penna  Past Medical History  Diagnosis Date  . Blindness   . Anxiety   . Hypertension   . Depression   . GERD (gastroesophageal reflux disease)   . Pneumonia   . Complication of anesthesia     "Wake up in the middle of procedure."  . Dysrhythmia   . Stroke 2003    Legally Blind  . Chronic kidney disease      Tues, Thurs, Sat  . History of blood transfusion   . Myocardial infarction 2000    no cardiologist-   Past Surgical History  Procedure Laterality Date  . Arteriovenous graft placement  11-07-1998    right upper extremity   . Arteriovenous graft placement  10-18-1998    right upper extremity  . Cardiac catheterization      Stents  . Arteriovenous graft placement      Right arm x 2  . Artreriovenous      Graft Right leg  . 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  . Peritoneal catheter insertion    . Peritoneal catheter removal    . Colon resection      due to peritonitis  . Nephrostomy      left  . Sp declot avgg  04/19/12    Left Thigh  . Fistulogram Left 01/19/2013    Procedure: FISTULOGRAM;  Surgeon: Fransisco Hertz, MD;  Location: [facility] OR;  Service: Vascular;  Laterality: Left;  SHUNTOGRAM  . Angioplasty Left 01/19/2013    Procedure: Balloon Angioplasty;  Surgeon: Fransisco Hertz, MD;  Location: [facility] OR;  Service: Vascular;  Laterality: Left;  Left thigh fistulogram and balloon angioplasty   Family History  Problem Relation Age of Onset  . Cancer Father     Lung   History  Substance Use Topics  . Smoking status: Never Smoker   . Smokeless tobacco: Never Used  . Alcohol Use: No    Review of Systems  Constitutional: Negative for fever and chills.  Respiratory: Negative for chest tightness and shortness of breath.   Cardiovascular: Negative for chest pain.  Musculoskeletal: Positive for myalgias and arthralgias.       Left thigh swelling  All other systems reviewed and are negative.    Allergies  Review of patient's allergies indicates no known allergies.  Home Medications   Current Outpatient Rx  Name  Route  Sig  Dispense  Refill  . aspirin EC 81 MG tablet   Oral   Take 81 mg by mouth daily.         Marland Kitchen aspirin-acetaminophen-caffeine (EXCEDRIN MIGRAINE) 250-250-65 MG per tablet   Oral   Take 2-3 tablets by mouth every 6 (six) hours as needed for  pain.         Marland Kitchen b complex-vitamin c-folic acid (NEPHRO-VITE) 0.8 MG TABS   Oral   Take 0.8 mg by mouth daily.         . famotidine (PEPCID) 20 MG tablet   Oral   Take 20 mg by mouth daily.         . hydrOXYzine (ATARAX/VISTARIL) 25 MG tablet   Oral   Take 25 mg by mouth every 6 (six) hours as needed. For itching         . LORazepam (ATIVAN) 2 MG tablet   Oral   Take 2 mg by mouth 2 (two) times daily as needed. For anxiety         . midodrine (PROAMATINE) 5 MG tablet   Oral   Take 10 mg by mouth 3 (three) times a week.          . sevelamer (RENAGEL) 800 MG tablet   Oral   Take 3,200 mg by mouth 3 (three) times daily with meals.          . simvastatin (ZOCOR) 40 MG tablet   Oral   Take 40 mg by mouth every evening.         . warfarin (COUMADIN) 5 MG tablet   Oral   Take 5 mg by mouth daily.         Marland Kitchen zolpidem (AMBIEN) 5 MG tablet   Oral   Take 5 mg by mouth at bedtime as needed for sleep.          Marland Kitchen oxyCODONE-acetaminophen (PERCOCET/ROXICET) 5-325 MG per tablet   Oral   Take 1 tablet by mouth every 8 (eight) hours as needed for pain.   9 tablet   0    BP 117/73  Pulse 98  Temp(Src) 98.5 F (36.9 C) (Oral)  Resp 20  SpO2 98% Physical Exam  Nursing note and vitals reviewed. Constitutional: He is oriented to person, place, and time. He appears well-developed and well-nourished. No distress.  HENT:  Head: Normocephalic and atraumatic.  Neck: Normal range of motion. Neck supple.  Cardiovascular: Normal rate, regular rhythm and normal heart sounds.  Exam reveals no friction rub.   No murmur heard. Pulses:      Radial pulses are 2+ on the right side, and 2+ on the left side.       Dorsalis pedis pulses are 2+ on the right side, and 2+ on the left side.  Negative ankle swelling Negative pitting edema  Pulmonary/Chest: Effort normal and breath sounds normal. No respiratory distress. He has no wheezes. He has no rales.  Musculoskeletal:        Legs: Bilateral knees mildly flexed, baseline for patient.  Neurological: He is alert and oriented to person, place, and  time. He exhibits normal muscle tone.  Skin: Skin is warm and dry. No rash noted. He is not diaphoretic. No erythema.  Approximately 11 cm x 12 cm area of swelling noted to the proximal third of the anterior aspect of the left thigh. Mild discomfort upon palpation. Mild warmth upon palpation.   Arteriovenous grafts localized to the right and left forearms - extensor surface, vibration identified. Arteriovenous graft located to the proximal third of the anterior aspect of the left thigh, decreased vibration identified.  Psychiatric: He has a normal mood and affect. His behavior is normal. Thought content normal.    ED Course  Procedures (including critical care time)  11:08 AM Spoke with Dr. Darrick Penna, Vascular Surgery, discussed case. Vascular Surgery to see patient.  12:08 PM Spoke with patient, reported that pain is still a 10/10. Discussed with patient that vascular surgery will be coming down to assess him.   Patient was seen and assessed by vascular Surgery and was cleared for discharge to go home. Vascular surgery recommended patient apply ice to the site, and if symptoms are to worsen and patient is to develop chills and fever he is to report back to the ED. Vascular Surgery schedule an appointment with Dr. Earle Gell on 08/15/2013. Patient to continue to go to dialysis.   Labs Review Labs Reviewed  CBC WITH DIFFERENTIAL - Abnormal; Notable for the following:    RBC 3.32 (*)    Hemoglobin 9.6 (*)    HCT 30.3 (*)    Lymphocytes Relative 10 (*)    Eosinophils Relative 8 (*)    All other components within normal limits  COMPREHENSIVE METABOLIC PANEL - Abnormal; Notable for the following:    Creatinine, Ser 5.14 (*)    Total Protein 8.6 (*)    Albumin 3.4 (*)    Alkaline Phosphatase 140 (*)    GFR calc non Af Amer 12 (*)    GFR calc Af Amer 14 (*)    All other  components within normal limits  PROTIME-INR - Abnormal; Notable for the following:    Prothrombin Time 22.1 (*)    INR 2.00 (*)    All other components within normal limits  APTT - Abnormal; Notable for the following:    aPTT 56 (*)    All other components within normal limits   Imaging Review No results found.  MDM   1. Leg swelling   2. CKD (chronic kidney disease) requiring chronic dialysis     Patient presenting to emergency department with left leg swelling that has been ongoing for the past 3 weeks. Patient reported that he had an arteriovenous graft placed to the left thigh approximately one year ago. Reported the pain as a tightness sensation with soreness to the touch. Denied numbness, tingling, loss of sensation.  Alert and oriented. Lungs clear to auscultation bilaterally. Heart rate and rhythm normal. Pulses palpable. Arteriovenous graft located to bilateral forearms, extensor surface-vibration identified. Arteriovenous graft located to the proximal third of the anterior aspect of the left thigh, decreased vibration identified. Swelling localized to the proximal third of the anterior aspect of the left thigh. Pain upon palpation, mild warmth upon palpation. Negative erythema, inflammation, drainage identified. Patient seen and assessed by Vascular Surgery, patient cleared for discharge - Vascular Surgery recommended that patient use ice for the site and that patient follow-up as outpatient on 08/15/2013. Patient stable, afebrile. Pain controlled in ED setting. Patient cleared for discharge by Vascular Surgery. Patient discharged. Discharged patient with small dose  of pain medications - discussed with patient the course, precautions, disposal technique. Discussed with patient to continue with dialysis tomorrow. Discussed with patient to apply ice to the site. Educated patient that if the site becomes red, hot to the touch, more swollen, he develops fever he is to come to the ED  immediately. Discussed with patient to closely monitor symptoms and if symptoms are to worsen or change to report back to the ED - strict return instructions given.  Patient agreed to plan of care, understood, all questions answered.      Raymon Mutton, PA-C 07/31/13 [phone]

## 2013-07-29 NOTE — ED Notes (Signed)
Attempted IV start x2, unsuccessful. IV team paged 

## 2013-08-01 ENCOUNTER — Ambulatory Visit: Payer: Medicare Other | Admitting: Surgery

## 2013-08-01 ENCOUNTER — Other Ambulatory Visit (HOSPITAL_COMMUNITY): Payer: Medicare Other

## 2013-08-01 NOTE — ED Provider Notes (Signed)
Medical screening examination/treatment/procedure(s) were performed by non-physician practitioner and as supervising physician I was immediately available for consultation/collaboration.   Gilda Crease, MD 08/01/13 1326

## 2013-08-12 ENCOUNTER — Ambulatory Visit: Payer: Medicare Other | Admitting: Vascular Surgery

## 2013-08-12 ENCOUNTER — Other Ambulatory Visit (HOSPITAL_COMMUNITY): Payer: Medicare Other

## 2013-08-12 ENCOUNTER — Encounter: Payer: Self-pay | Admitting: Surgery

## 2013-08-15 ENCOUNTER — Ambulatory Visit (INDEPENDENT_AMBULATORY_CARE_PROVIDER_SITE_OTHER): Payer: Medicare Other | Admitting: Surgery

## 2013-08-15 ENCOUNTER — Encounter: Payer: Self-pay | Admitting: Surgery

## 2013-08-15 VITALS — BP 117/74 | HR 83 | Resp 18 | Ht 67.5 in | Wt 190.7 lb

## 2013-08-15 DIAGNOSIS — M7989 Other specified soft tissue disorders: Secondary | ICD-10-CM | POA: Insufficient documentation

## 2013-08-15 DIAGNOSIS — T82598A Other mechanical complication of other cardiac and vascular devices and implants, initial encounter: Secondary | ICD-10-CM

## 2013-08-15 NOTE — Progress Notes (Signed)
[facility] [place]   Patient name: [name] MRN: [id number] DOB: 1966/10/20 Sex: male     Chief Complaint  Patient presents with  . Re-evaluation    f/u  evaluate left leg swelling    HISTORY OF PRESENT ILLNESS: The patient is here today for followup of his left thigh graft.  He was recently seen in the emergency department by Dr. fields for a hematoma.  The patient is on anticoagulations.  He reports no difficulty with cannulation for dialysis access.  Past Medical History  Diagnosis Date  . Blindness   . Anxiety   . Hypertension   . Depression   . GERD (gastroesophageal reflux disease)   . Pneumonia   . Complication of anesthesia     "Wake up in the middle of procedure."  . Dysrhythmia   . Stroke 2003    Legally Blind  . Chronic kidney disease     Tues, Thurs, Sat  . History of blood transfusion   . Myocardial infarction 2000    no cardiologist-    Past Surgical History  Procedure Laterality Date  . Arteriovenous graft placement  11-07-1998    right upper extremity   . Arteriovenous graft placement  10-18-1998    right upper extremity  . Cardiac catheterization      Stents  . Arteriovenous graft placement      Right arm x 2  . Artreriovenous      Graft Right leg  . 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  . Peritoneal catheter insertion    . Peritoneal catheter removal    . Colon resection      due to peritonitis  . Nephrostomy      left  . Sp declot avgg  04/19/12    Left Thigh  . Fistulogram Left 01/19/2013    Procedure: FISTULOGRAM;  Surgeon: Fransisco Hertz, MD;  Location: [facility] OR;  Service: Vascular;  Laterality: Left;  SHUNTOGRAM  . Angioplasty Left 01/19/2013    Procedure: Balloon Angioplasty;  Surgeon: Fransisco Hertz, MD;  Location: [facility] OR;  Service: Vascular;  Laterality: Left;  Left thigh fistulogram and balloon angioplasty    History   Social History  . Marital Status: Single    Spouse Name: N/A    Number of  Children: N/A  . Years of Education: N/A   Occupational History  . Not on file.   Social History Main Topics  . Smoking status: Never Smoker   . Smokeless tobacco: Never Used  . Alcohol Use: No  . Drug Use: No  . Sexual Activity: Not Currently   Other Topics Concern  . Not on file   Social History Narrative  . No narrative on file    Family History  Problem Relation Age of Onset  . Cancer Father     Lung    Allergies as of 08/15/2013  . (No Known Allergies)    Current Outpatient Prescriptions on File Prior to Visit  Medication Sig Dispense Refill  . aspirin EC 81 MG tablet Take 81 mg by mouth daily.      Marland Kitchen aspirin-acetaminophen-caffeine (EXCEDRIN MIGRAINE) 250-250-65 MG per tablet Take 2-3 tablets by mouth every 6 (six) hours as needed for pain.      Marland Kitchen b complex-vitamin c-folic acid (NEPHRO-VITE) 0.8 MG TABS Take 0.8 mg by mouth daily.      . famotidine (PEPCID) 20 MG tablet Take 20 mg by mouth daily.      Marland Kitchen  hydrOXYzine (ATARAX/VISTARIL) 25 MG tablet Take 25 mg by mouth every 6 (six) hours as needed. For itching      . LORazepam (ATIVAN) 2 MG tablet Take 2 mg by mouth 2 (two) times daily as needed. For anxiety      . midodrine (PROAMATINE) 5 MG tablet Take 10 mg by mouth 3 (three) times a week.       . sevelamer (RENAGEL) 800 MG tablet Take 3,200 mg by mouth 3 (three) times daily with meals.       . simvastatin (ZOCOR) 40 MG tablet Take 40 mg by mouth every evening.      . warfarin (COUMADIN) 5 MG tablet Take 5 mg by mouth daily.      Marland Kitchen zolpidem (AMBIEN) 5 MG tablet Take 5 mg by mouth at bedtime as needed for sleep.       Marland Kitchen oxyCODONE-acetaminophen (PERCOCET/ROXICET) 5-325 MG per tablet Take 1 tablet by mouth every 8 (eight) hours as needed for pain.  9 tablet  0   Current Facility-Administered Medications on File Prior to Visit  Medication Dose Route Frequency Provider Last Rate Last Dose  . fentaNYL (SUBLIMAZE) injection   Intravenous PRN Art A Hoss, MD   25 mcg at  06/12/12 1049  . midazolam (VERSED) 5 MG/5ML injection   Intravenous PRN Art A Hoss, MD   1 mg at 06/12/12 1048     REVIEW OF SYSTEMS: Cardiovascular: No chest pain, chest pressure. Pulmonary: No productive cough, asthma or wheezing. Neurologic: No weakness, paresthesias, aphasia, or amaurosis. No dizziness. Hematologic: On anticoagulation Musculoskeletal: No joint pain or joint swelling. Gastrointestinal: No blood in stool or hematemesis Genitourinary: On dialysis Integumentary: No rashes or ulcers. Constitutional: No fever or chills.  PHYSICAL EXAMINATION:   Vital signs are BP 117/74  Pulse 83  Resp 18  Ht 5' 7.5" (1.715 m)  Wt 190 lb 11.2 oz (86.5 kg)  BMI 29.41 kg/m2 General: The patient appears their stated age. HEENT:  No gross abnormalities Pulmonary:  Non labored breathing Neurologic: No focal weakness or paresthesias are detected, Skin: There are no ulcer or rashes noted. Psychiatric: The patient has normal affect. Cardiovascular: Audible thrill within left thigh graft.  There is a fullness around the proximal lateral side of the graft.  No evidence of infection.   Diagnostic Studies None  Assessment: End-stage renal disease Plan: The patient has a slowly resolving hematoma on the left thigh graft.  There is no evidence of infection.  This area is not expanding.  He is able to dialyze without difficulty.  I do not recommend any intervention on this, as it is not causing him any issues at this time.  He'll followup on a when necessary basis  V. Charlena Cross, M.D. [facility] [place] Office: [phone] Pager:  [phone]

## 2013-08-31 ENCOUNTER — Ambulatory Visit: Payer: Medicare Other | Admitting: Vascular Surgery

## 2013-08-31 ENCOUNTER — Other Ambulatory Visit (HOSPITAL_COMMUNITY): Payer: Medicare Other

## 2013-09-08 ENCOUNTER — Telehealth: Payer: Self-pay | Admitting: Vascular Surgery

## 2013-09-08 NOTE — Telephone Encounter (Addendum)
Message copied by Fredrich Birks on Thu Sep 08, 2013  3:06 PM ------      Message from: Melene Plan      Created: Thu Sep 08, 2013  2:25 PM       PER CHARLES LYLES,RENAL PA; MR Hendler'S HEMATOMA ABOVE HIS THIGH AVG IS NOT RESOLVING. MR Whitson WOULD LIKE TO SEE DR CHEN(WHO PUT IT IN) FOR OFFICE VISIT      THANKS      JUDY ------  09/08/13: spoke with receptionist at [facility]- faxed info to their office, they will notify pt, dpm

## 2013-09-15 ENCOUNTER — Encounter: Payer: Self-pay | Admitting: Vascular Surgery

## 2013-09-16 ENCOUNTER — Encounter: Payer: Self-pay | Admitting: Vascular Surgery

## 2013-09-16 ENCOUNTER — Ambulatory Visit (INDEPENDENT_AMBULATORY_CARE_PROVIDER_SITE_OTHER): Payer: Medicare Other | Admitting: Vascular Surgery

## 2013-09-16 VITALS — BP 111/66 | HR 102 | Resp 18 | Ht 67.5 in | Wt 190.7 lb

## 2013-09-16 DIAGNOSIS — N186 End stage renal disease: Secondary | ICD-10-CM

## 2013-09-16 DIAGNOSIS — T82898S Other specified complication of vascular prosthetic devices, implants and grafts, sequela: Secondary | ICD-10-CM

## 2013-09-16 DIAGNOSIS — M7989 Other specified soft tissue disorders: Secondary | ICD-10-CM

## 2013-09-16 DIAGNOSIS — T82898A Other specified complication of vascular prosthetic devices, implants and grafts, initial encounter: Secondary | ICD-10-CM | POA: Insufficient documentation

## 2013-09-16 NOTE — Progress Notes (Signed)
[facility] [place]  Established Dialysis Access  History of Present Illness  [name] is a 47 y.o. (1966/04/30) male who presents for re-evaluation of L thigh AVG.  This L thigh AVG is this patient's last access site.  By report, he has had a hematoma develop a few weeks ago after a difficult stick.  He was seen subsequently by Dr. Darrick Penna and [name], both who felt it was a hematoma related his recent cannulation.  The patient feels the hematoma has enlarged.  The patient's PMH, PSH, SH, FamHx, Med, and Allergies are unchanged from 08/15/13.  On ROS today: no rest pain, no bleeding complications  Physical Examination  Filed Vitals:   09/16/13 1523  BP: 111/66  Pulse: 102  Resp: 18  Height: 5' 7.5" (1.715 m)  Weight: 190 lb 11.2 oz (86.5 kg)   Body mass index is 29.41 kg/(m^2).  General: A&O x 3, wheelchair bound  Pulmonary: Sym exp, good air movt, CTAB, no rales, rhonchi, & wheezing  Cardiac: RRR, Nl S1, S2, no Murmurs, rubs or gallops  Gastrointestinal: soft, NTND, -G/R, - HSM, - masses, - CVAT B  Musculoskeletal: L thigh with palpable thrill and bruit, firm somewhat pulsatile mass surround part of the venous arm of the thigh AVG, no skin compromise  Neurologic: Pain and light touch intact in extremities , Motor exam as listed above  Medical Decision Making  [name] is a 47 y.o. 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  Prior procedures have been very difficult.  Subsequently, I would approach his PSA from an endovascular approach: L thigh shuntogram, possible intervention with covered stent.  I discussed with the patient the nature of angiographic procedures, especially the limited patencies of any endovascular intervention.   The patient is aware of that the risks of an angiographic procedure include but  are not limited to: bleeding, infection, access site complications, renal failure, embolization, rupture of vessel, dissection, possible need for emergent surgical intervention, possible need for surgical procedures to treat the patient's pathology, anaphylactic reaction to contrast, and stroke and death.    The patient is aware of the risks and agrees to proceed.  He is scheduled for the 8 DEC 14.  If he develops any bleeding complications before then send him to [facility] for immediate intervention especially as this is this patient's last access.   Leonides Sake, MD [facility] [place] Office: [phone] Pager: [phone]  09/16/2013, 5:09 PM

## 2013-09-27 ENCOUNTER — Other Ambulatory Visit: Payer: Self-pay

## 2013-09-29 ENCOUNTER — Encounter (HOSPITAL_COMMUNITY): Payer: Self-pay | Admitting: Pharmacy Technician

## 2013-10-03 ENCOUNTER — Encounter (HOSPITAL_COMMUNITY)
Admission: RE | Disposition: A | Discharge status: Home / Self Care | Payer: Self-pay | Source: Ambulatory Visit | Attending: Vascular Surgery

## 2013-10-03 ENCOUNTER — Ambulatory Visit (HOSPITAL_COMMUNITY)
Admission: RE | Admit: 2013-10-03 | Discharge: 2013-10-03 | Disposition: A | Discharge status: Home / Self Care | Payer: Medicare Other | Source: Ambulatory Visit | Attending: Vascular Surgery | Admitting: Vascular Surgery

## 2013-10-03 DIAGNOSIS — Z992 Dependence on renal dialysis: Secondary | ICD-10-CM | POA: Insufficient documentation

## 2013-10-03 DIAGNOSIS — T82898A Other specified complication of vascular prosthetic devices, implants and grafts, initial encounter: Secondary | ICD-10-CM

## 2013-10-03 DIAGNOSIS — Z01812 Encounter for preprocedural laboratory examination: Secondary | ICD-10-CM | POA: Insufficient documentation

## 2013-10-03 DIAGNOSIS — M349 Systemic sclerosis, unspecified: Secondary | ICD-10-CM | POA: Insufficient documentation

## 2013-10-03 DIAGNOSIS — Y832 Surgical operation with anastomosis, bypass or graft as the cause of abnormal reaction of the patient, or of later complication, without mention of misadventure at the time of the procedure: Secondary | ICD-10-CM | POA: Insufficient documentation

## 2013-10-03 DIAGNOSIS — N186 End stage renal disease: Secondary | ICD-10-CM | POA: Insufficient documentation

## 2013-10-03 HISTORY — PX: SHUNTOGRAM: SHX5491

## 2013-10-03 LAB — POCT I-STAT, CHEM 8
Chloride: 99 mEq/L (ref 96–112)
Glucose, Bld: 87 mg/dL (ref 70–99)
HCT: 39 % (ref 39.0–52.0)
Potassium: 5 mEq/L (ref 3.5–5.1)
TCO2: 25 mmol/L (ref 0–100)

## 2013-10-03 LAB — PROTIME-INR: Prothrombin Time: 14.7 seconds (ref 11.6–15.2)

## 2013-10-03 SURGERY — ASSESSMENT, SHUNT FUNCTION, WITH CONTRAST RADIOGRAPHIC STUDY
Anesthesia: LOCAL | Laterality: Left

## 2013-10-03 MED ORDER — ACETAMINOPHEN 325 MG PO TABS: 650.0000 mg | ORAL_TABLET | ORAL | Status: DC | PRN

## 2013-10-03 MED ORDER — VERAPAMIL HCL 2.5 MG/ML IV SOLN
INTRAVENOUS | Status: AC
  Filled 2013-10-03: qty 2

## 2013-10-03 MED ORDER — HEPARIN SODIUM (PORCINE) 1000 UNIT/ML IJ SOLN
INTRAMUSCULAR | Status: AC
  Filled 2013-10-03: qty 1

## 2013-10-03 MED ORDER — SODIUM CHLORIDE 0.9 % IJ SOLN: 3.0000 mL | INTRAMUSCULAR | Status: DC | PRN

## 2013-10-03 MED ORDER — MIDAZOLAM HCL 2 MG/2ML IJ SOLN
INTRAMUSCULAR | Status: AC
  Filled 2013-10-03: qty 2

## 2013-10-03 MED ORDER — LIDOCAINE HCL (PF) 1 % IJ SOLN
INTRAMUSCULAR | Status: AC
  Filled 2013-10-03: qty 30

## 2013-10-03 MED ORDER — MORPHINE SULFATE 10 MG/ML IJ SOLN: 1.0000 mg | Freq: Once | INTRAMUSCULAR | Status: DC

## 2013-10-03 MED ORDER — FENTANYL CITRATE 0.05 MG/ML IJ SOLN
INTRAMUSCULAR | Status: AC
  Filled 2013-10-03: qty 2

## 2013-10-03 MED ORDER — OXYCODONE-ACETAMINOPHEN 5-325 MG PO TABS: 1.0000 | ORAL_TABLET | ORAL | Status: DC | PRN

## 2013-10-03 MED ORDER — MORPHINE SULFATE 2 MG/ML IJ SOLN
INTRAMUSCULAR | Status: AC
  Administered 2013-10-03: 1 mg via INTRAVENOUS
  Filled 2013-10-03: qty 1

## 2013-10-03 MED ORDER — HEPARIN (PORCINE) IN NACL 2-0.9 UNIT/ML-% IJ SOLN
INTRAMUSCULAR | Status: AC
  Filled 2013-10-03: qty 1000

## 2013-10-03 MED ORDER — ONDANSETRON HCL 4 MG/2ML IJ SOLN: 4.0000 mg | Freq: Four times a day (QID) | INTRAMUSCULAR | Status: DC | PRN

## 2013-10-03 NOTE — Progress Notes (Signed)
Pt received from cath procedure. Drowsy, but alert.  Pt denies any discomfort at this time.

## 2013-10-03 NOTE — H&P (View-Only) (Signed)
[facility] [place]  Established Dialysis Access  History of Present Illness  [name] is a 47 y.o. (01/21/1966) male who presents for re-evaluation of L thigh AVG.  This L thigh AVG is this patient's last access site.  By report, he has had a hematoma develop a few weeks ago after a difficult stick.  He was seen subsequently by Dr. Fields and Dr. Brabham, both who felt it was a hematoma related his recent cannulation.  The patient feels the hematoma has enlarged.  The patient's PMH, PSH, SH, FamHx, Med, and Allergies are unchanged from 08/15/13.  On ROS today: no rest pain, no bleeding complications  Physical Examination  Filed Vitals:   09/16/13 1523  BP: 111/66  Pulse: 102  Resp: 18  Height: 5' 7.5" (1.715 m)  Weight: 190 lb 11.2 oz (86.5 kg)   Body mass index is 29.41 kg/(m^2).  General: A&O x 3, wheelchair bound  Pulmonary: Sym exp, good air movt, CTAB, no rales, rhonchi, & wheezing  Cardiac: RRR, Nl S1, S2, no Murmurs, rubs or gallops  Gastrointestinal: soft, NTND, -G/R, - HSM, - masses, - CVAT B  Musculoskeletal: L thigh with palpable thrill and bruit, firm somewhat pulsatile mass surround part of the venous arm of the thigh AVG, no skin compromise  Neurologic: Pain and light touch intact in extremities , Motor exam as listed above  Medical Decision Making  [name] is a 47 y.o. 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  Prior procedures have been very difficult.  Subsequently, I would approach his PSA from an endovascular approach: L thigh shuntogram, possible intervention with covered stent.  I discussed with the patient the nature of angiographic procedures, especially the limited patencies of any endovascular intervention.   The patient is aware of that the risks of an angiographic procedure include but  are not limited to: bleeding, infection, access site complications, renal failure, embolization, rupture of vessel, dissection, possible need for emergent surgical intervention, possible need for surgical procedures to treat the patient's pathology, anaphylactic reaction to contrast, and stroke and death.    The patient is aware of the risks and agrees to proceed.  He is scheduled for the 8 DEC 14.  If he develops any bleeding complications before then send him to [facility] for immediate intervention especially as this is this patient's last access.   Richard Caniglia, MD [facility] [place] Office: [phone] Pager: [phone]  09/16/2013, 5:09 PM   

## 2013-10-03 NOTE — Op Note (Signed)
OPERATIVE NOTE   PROCEDURE: 1.  left thigh arteriovenous graft cannulation under ultrasound guidance x 2 2.  left thigh shuntogram 3.  Venoplasty of left femoral vein and thigh graft (6 mm x 80 mm)  PRE-OPERATIVE DIAGNOSIS: Malfunctioning left thigh arteriovenous graft  POST-OPERATIVE DIAGNOSIS: same as above   SURGEON: Leonides Sake, MD  ANESTHESIA: local  ESTIMATED BLOOD LOSS: 5 cc  FINDING(S): 1. Mild thrombus load within graft: treated with angioplasty 2.   Widely patent arterial anastomosis and graft without any evidence of contrast extravasation 3.   Venous anastomosis with 75-90% stenosis: nearly resolved with angioplasty  SPECIMEN(S):  None  CONTRAST: 55 cc  INDICATIONS: [name] is a 47 y.o. male who presents with pulsatile mass in left thigh in setting of infilitration during dialysis.  I had concerns that this patient had frank rupture of his thigh arteriovenous graft so I scheduled him for a thigh shuntogram, possible intervention.  The patient is scheduled for left thigh shuntogram.  The patient is aware the risks include but are not limited to: bleeding, infection, thrombosis of the cannulated access, and possible anaphylactic reaction to the contrast.  The patient is aware of the risks of the procedure and elects to proceed forward.  DESCRIPTION: After full informed written consent was obtained, the patient was brought back to the angiography suite and placed supine upon the angiography table.  The patient was connected to monitoring equipment.  The left thigh was prepped and draped in the standard fashion for a percutaneous access intervention.  Under ultrasound guidance, the left thigh arteriovenous graft was cannulated with a micropuncture needle oriented toward the arterial anastomosis.  .  The microwire was advanced into the fistula and the needle was exchanged for the a microsheath, which was lodged 2 cm into the access.  The wire was removed and the sheath was  connected to the IV extension tubing.  Hand injections were completed to image the access from cannulation site in mid-graft to venous outflow.  A Benson wire was advanced into the common femoral artery and the sheath was exchanged for a short 6-Fr sheath.  I then place an end-hole catheter into the left common femoral artery.  Hand injections imaged the rest of the graft.  I placed a 6 mm x 80 mm angioplasty balloon and inflated it along the entire arterial arm of this graft to macerate the limited thrombus in the thigh graft.  After completing this process, I removed the balloon and wire. A 4-0 Monocryl purse-string suture was sewn around the sheath.  The sheath was removed while tying down the suture.    At this point, under ultrasound guidance, the left thigh arteriovenous graft was cannulated with a micropuncture needle oriented toward the venous anastomosis.  .  The microwire was advanced into the fistula and the needle was exchanged for the a microsheath, which was lodged 2 cm into the access.  The wire was exchanged for the [facility] wire.  The [facility] wire would not pass the stenosis at the venous anastomosis.  I exchanged the microsheath for the 6-Fr short sheath.  I placed the balloon over the wire, and using this as a catheter, I was able to get through the venous anastomosis.  I inflated the balloon at 18 atm for 2 minutes, centered on the anastomosis.  A significant waist was visualized and resolved with continued inflation.  I then inflated the balloon along the entire venous arm in a similar fashion to the arterial arm.  On completion imaging, the venous anastomotic stenosis was nearly resolved.  Based on the completion imaging, no further intervention is necessary.  The wire and balloon were removed from the sheath.  A 4-0 Monocryl purse-string suture was sewn around the sheath.  The sheath was removed while tying down the suture.  A sterile bandage was applied to the puncture site.  COMPLICATIONS:  none  CONDITION: stable  Leonides Sake, MD [facility] [place] Office: [phone] Pager: [phone]  10/03/2013 2:12 PM

## 2013-10-03 NOTE — Progress Notes (Signed)
Pt discharge instruction given to family.  Pt and family able to verbalize understanding.  Pt to car via wheelchair.

## 2013-10-03 NOTE — Interval H&P Note (Signed)
[facility] [place]  History and Physical Update  The patient was interviewed and re-examined.  The patient's previous History and Physical has been reviewed and is unchanged.  There is no change in the plan of care: L thigh shuntogram, possible intervention.  Leonides Sake, MD [facility] [place] Office: [phone] Pager: [phone]

## 2013-10-04 ENCOUNTER — Telehealth: Payer: Self-pay | Admitting: Vascular Surgery

## 2013-10-04 NOTE — Telephone Encounter (Addendum)
Message copied by Fredrich Birks on Tue Oct 04, 2013  2:57 PM ------      Message from: Melene Plan      Created: Mon Oct 03, 2013  2:56 PM                   ----- Message -----         From: Fransisco Hertz, MD         Sent: 10/03/2013   2:22 PM           To: Reuel Derby, Melene Plan, RN            TRISTAIN DAILY      [id number]      1966/04/18            PROCEDURE:      1.  left thigh arteriovenous graft cannulation under ultrasound guidance x 2      2.  left thigh shuntogram      3.  Venoplasty of left femoral vein and thigh graft (6 mm x 80 mm)            Follow-up: 4 weeks       ------  10/04/13: lm for pt, dpm

## 2013-11-03 ENCOUNTER — Encounter: Payer: Self-pay | Admitting: Vascular Surgery

## 2013-11-04 ENCOUNTER — Ambulatory Visit (INDEPENDENT_AMBULATORY_CARE_PROVIDER_SITE_OTHER): Payer: Self-pay | Admitting: Vascular Surgery

## 2013-11-04 ENCOUNTER — Encounter: Payer: Self-pay | Admitting: Vascular Surgery

## 2013-11-04 VITALS — BP 113/56 | HR 91 | Ht 67.5 in | Wt 190.0 lb

## 2013-11-04 DIAGNOSIS — Z4931 Encounter for adequacy testing for hemodialysis: Secondary | ICD-10-CM

## 2013-11-04 DIAGNOSIS — N186 End stage renal disease: Secondary | ICD-10-CM

## 2013-11-04 NOTE — Addendum Note (Signed)
Addended by: Sharee PimpleMCCHESNEY, Skyleigh Windle K on: 11/04/2013 02:11 PM   Modules accepted: Orders

## 2013-11-04 NOTE — Progress Notes (Signed)
    Postoperative Visit   History of Present Illness  [name] is a 48 y.o. male who presents for postoperative follow-up from procedure on Date: 10/03/13: venoplasty L thigh AVG .  The patient notes some skin breakdown at access site without drainage.  HD staff are concerned pt needs to be on abx.  The patient is able to complete their activities of daily living.  The patient's current symptoms are: none.  He denies fever or chills.  He has had no bleeding from left thigh AVG..  Past Medical History, Past Surgical History, Social History, Family History, Medications, Allergies, and Review of Systems are unchanged from previous evaluation on 10/03/13.  For VQI Use Only  PRE-ADM LIVING: Home  AMB STATUS: Wheelchair  Physical Examination  Filed Vitals:   11/04/13 0909  BP: 113/56  Pulse: 91  Height: 5' 7.5" (1.715 m)  Weight: 190 lb (86.183 kg)  SpO2: 98%   Body mass index is 29.3 kg/(m^2).  Vascular: Palpable thrill and +strong bruit, small punctate opening near apex of graft some erythema, large hematoma remains over arterial arm of graft  Medical Decision Making  Richard AcheDonald R Bishara is a 48 y.o. male who presents s/p L thigh AVG shuntogram, venoplasty of venous anastomosis. Given skin breakdown near the AVG, I agree with IV abx x 2 weeks on HD and daily wound care to the area of skin breakdown.   I expect him to heal up the area without any difficulty. Based on his angiographic findings, this patient needs: q3 months access duplex.  He likely will need repeat intervention on the venous anastomosis in the future.   Due to this patient nephrogenic systemic fibrosis, he is not a candidate for a redo thigh graft or revision.  Thank you for allowing us to participate in this patient's care.  Leonides SakeBrian Cortlynn Hollinsworth, MD [facility] [place] Office: [phone] Pager: [phone]

## 2013-11-16 ENCOUNTER — Inpatient Hospital Stay: Payer: Self-pay | Admitting: Internal Medicine

## 2013-11-16 LAB — CBC WITH DIFFERENTIAL/PLATELET
Basophil #: 0.1 10*3/uL (ref 0.0–0.1)
Basophil %: 0.7 %
EOS PCT: 2.7 %
Eosinophil #: 0.2 10*3/uL (ref 0.0–0.7)
HCT: 40.2 % (ref 40.0–52.0)
HGB: 12.9 g/dL — ABNORMAL LOW (ref 13.0–18.0)
LYMPHS ABS: 0.5 10*3/uL — AB (ref 1.0–3.6)
Lymphocyte %: 5.6 %
MCH: 29.6 pg (ref 26.0–34.0)
MCHC: 32.1 g/dL (ref 32.0–36.0)
MCV: 92 fL (ref 80–100)
Monocyte #: 0.7 x10 3/mm (ref 0.2–1.0)
Monocyte %: 8.4 %
NEUTROS ABS: 6.6 10*3/uL — AB (ref 1.4–6.5)
Neutrophil %: 82.6 %
Platelet: 111 10*3/uL — ABNORMAL LOW (ref 150–440)
RBC: 4.37 10*6/uL — ABNORMAL LOW (ref 4.40–5.90)
RDW: 18 % — AB (ref 11.5–14.5)
WBC: 8 10*3/uL (ref 3.8–10.6)

## 2013-11-16 LAB — COMPREHENSIVE METABOLIC PANEL
ALK PHOS: 168 U/L — AB
ANION GAP: 14 (ref 7–16)
AST: 24 U/L (ref 15–37)
Albumin: 4.1 g/dL (ref 3.4–5.0)
BUN: 28 mg/dL — AB (ref 7–18)
Bilirubin,Total: 0.4 mg/dL (ref 0.2–1.0)
CREATININE: 6.05 mg/dL — AB (ref 0.60–1.30)
Calcium, Total: 10.2 mg/dL — ABNORMAL HIGH (ref 8.5–10.1)
Chloride: 96 mmol/L — ABNORMAL LOW (ref 98–107)
Co2: 24 mmol/L (ref 21–32)
EGFR (Non-African Amer.): 10 — ABNORMAL LOW
GFR CALC AF AMER: 12 — AB
Glucose: 91 mg/dL (ref 65–99)
Osmolality: 273 (ref 275–301)
Potassium: 4.2 mmol/L (ref 3.5–5.1)
SGPT (ALT): 22 U/L (ref 12–78)
Sodium: 134 mmol/L — ABNORMAL LOW (ref 136–145)
Total Protein: 9.4 g/dL — ABNORMAL HIGH (ref 6.4–8.2)

## 2013-11-16 LAB — CK TOTAL AND CKMB (NOT AT ARMC)
CK, Total: 74 U/L (ref 35–232)
CK-MB: 2.7 ng/mL (ref 0.5–3.6)

## 2013-11-16 LAB — PROTIME-INR
INR: 1.9
PROTHROMBIN TIME: 21.6 s — AB (ref 11.5–14.7)

## 2013-11-16 LAB — TROPONIN I
Troponin-I: 0.15 ng/mL — ABNORMAL HIGH
Troponin-I: 0.33 ng/mL — ABNORMAL HIGH

## 2013-11-16 LAB — CK-MB
CK-MB: 3.5 ng/mL (ref 0.5–3.6)
CK-MB: 5 ng/mL — ABNORMAL HIGH (ref 0.5–3.6)

## 2013-11-16 LAB — TSH: Thyroid Stimulating Horm: 3.2 u[IU]/mL

## 2013-11-16 LAB — PRO B NATRIURETIC PEPTIDE: B-TYPE NATIURETIC PEPTID: 6752 pg/mL — AB (ref 0–125)

## 2013-11-16 LAB — APTT: ACTIVATED PTT: 42.2 s — AB (ref 23.6–35.9)

## 2013-11-17 LAB — CK-MB: CK-MB: 4.9 ng/mL — ABNORMAL HIGH (ref 0.5–3.6)

## 2013-11-17 LAB — APTT
Activated PTT: 113.6 secs — ABNORMAL HIGH (ref 23.6–35.9)
Activated PTT: 84.4 secs — ABNORMAL HIGH (ref 23.6–35.9)

## 2013-11-17 LAB — COMPREHENSIVE METABOLIC PANEL
ALT: 17 U/L (ref 12–78)
Albumin: 3.6 g/dL (ref 3.4–5.0)
Alkaline Phosphatase: 150 U/L — ABNORMAL HIGH
Anion Gap: 13 (ref 7–16)
BUN: 34 mg/dL — AB (ref 7–18)
Bilirubin,Total: 0.5 mg/dL (ref 0.2–1.0)
CHLORIDE: 94 mmol/L — AB (ref 98–107)
CO2: 25 mmol/L (ref 21–32)
Calcium, Total: 9.8 mg/dL (ref 8.5–10.1)
Creatinine: 7 mg/dL — ABNORMAL HIGH (ref 0.60–1.30)
EGFR (Non-African Amer.): 8 — ABNORMAL LOW
GFR CALC AF AMER: 10 — AB
Glucose: 91 mg/dL (ref 65–99)
OSMOLALITY: 272 (ref 275–301)
Potassium: 4.1 mmol/L (ref 3.5–5.1)
SGOT(AST): 25 U/L (ref 15–37)
Sodium: 132 mmol/L — ABNORMAL LOW (ref 136–145)
TOTAL PROTEIN: 8.6 g/dL — AB (ref 6.4–8.2)

## 2013-11-17 LAB — CBC WITH DIFFERENTIAL/PLATELET
Basophil #: 0.1 10*3/uL (ref 0.0–0.1)
Basophil %: 1 %
EOS ABS: 0.1 10*3/uL (ref 0.0–0.7)
EOS PCT: 2 %
HCT: 37.5 % — ABNORMAL LOW (ref 40.0–52.0)
HGB: 12 g/dL — ABNORMAL LOW (ref 13.0–18.0)
LYMPHS PCT: 7.6 %
Lymphocyte #: 0.6 10*3/uL — ABNORMAL LOW (ref 1.0–3.6)
MCH: 29.2 pg (ref 26.0–34.0)
MCHC: 32.1 g/dL (ref 32.0–36.0)
MCV: 91 fL (ref 80–100)
Monocyte #: 1 x10 3/mm (ref 0.2–1.0)
Monocyte %: 13.2 %
NEUTROS PCT: 76.2 %
Neutrophil #: 5.6 10*3/uL (ref 1.4–6.5)
Platelet: 166 10*3/uL (ref 150–440)
RBC: 4.12 10*6/uL — ABNORMAL LOW (ref 4.40–5.90)
RDW: 17.5 % — ABNORMAL HIGH (ref 11.5–14.5)
WBC: 7.3 10*3/uL (ref 3.8–10.6)

## 2013-11-17 LAB — PROTIME-INR
INR: 2
Prothrombin Time: 22.2 secs — ABNORMAL HIGH (ref 11.5–14.7)

## 2013-11-17 LAB — PHOSPHORUS: Phosphorus: 6.6 mg/dL — ABNORMAL HIGH (ref 2.5–4.9)

## 2013-11-17 LAB — TROPONIN I: Troponin-I: 1.4 ng/mL — ABNORMAL HIGH

## 2013-11-18 ENCOUNTER — Encounter (HOSPITAL_COMMUNITY): Payer: Self-pay | Admitting: Internal Medicine

## 2013-11-18 DIAGNOSIS — Z79899 Other long term (current) drug therapy: Secondary | ICD-10-CM

## 2013-11-18 DIAGNOSIS — J9600 Acute respiratory failure, unspecified whether with hypoxia or hypercapnia: Secondary | ICD-10-CM

## 2013-11-18 DIAGNOSIS — D62 Acute posthemorrhagic anemia: Secondary | ICD-10-CM | POA: Diagnosis not present

## 2013-11-18 DIAGNOSIS — I509 Heart failure, unspecified: Secondary | ICD-10-CM | POA: Diagnosis present

## 2013-11-18 DIAGNOSIS — T82598A Other mechanical complication of other cardiac and vascular devices and implants, initial encounter: Secondary | ICD-10-CM | POA: Diagnosis present

## 2013-11-18 DIAGNOSIS — Z7982 Long term (current) use of aspirin: Secondary | ICD-10-CM

## 2013-11-18 DIAGNOSIS — I442 Atrioventricular block, complete: Secondary | ICD-10-CM

## 2013-11-18 DIAGNOSIS — I2589 Other forms of chronic ischemic heart disease: Secondary | ICD-10-CM | POA: Diagnosis present

## 2013-11-18 DIAGNOSIS — Z8673 Personal history of transient ischemic attack (TIA), and cerebral infarction without residual deficits: Secondary | ICD-10-CM

## 2013-11-18 DIAGNOSIS — D6859 Other primary thrombophilia: Secondary | ICD-10-CM | POA: Diagnosis present

## 2013-11-18 DIAGNOSIS — N2581 Secondary hyperparathyroidism of renal origin: Secondary | ICD-10-CM | POA: Diagnosis present

## 2013-11-18 DIAGNOSIS — Z992 Dependence on renal dialysis: Secondary | ICD-10-CM

## 2013-11-18 DIAGNOSIS — Z940 Kidney transplant status: Secondary | ICD-10-CM

## 2013-11-18 DIAGNOSIS — D689 Coagulation defect, unspecified: Secondary | ICD-10-CM

## 2013-11-18 DIAGNOSIS — Z66 Do not resuscitate: Secondary | ICD-10-CM | POA: Diagnosis not present

## 2013-11-18 DIAGNOSIS — I120 Hypertensive chronic kidney disease with stage 5 chronic kidney disease or end stage renal disease: Secondary | ICD-10-CM | POA: Diagnosis present

## 2013-11-18 DIAGNOSIS — T45515A Adverse effect of anticoagulants, initial encounter: Secondary | ICD-10-CM | POA: Diagnosis not present

## 2013-11-18 DIAGNOSIS — M899 Disorder of bone, unspecified: Secondary | ICD-10-CM | POA: Diagnosis present

## 2013-11-18 DIAGNOSIS — R570 Cardiogenic shock: Secondary | ICD-10-CM | POA: Diagnosis present

## 2013-11-18 DIAGNOSIS — I2510 Atherosclerotic heart disease of native coronary artery without angina pectoris: Secondary | ICD-10-CM | POA: Diagnosis present

## 2013-11-18 DIAGNOSIS — N186 End stage renal disease: Secondary | ICD-10-CM | POA: Diagnosis present

## 2013-11-18 DIAGNOSIS — G9340 Encephalopathy, unspecified: Secondary | ICD-10-CM | POA: Diagnosis not present

## 2013-11-18 DIAGNOSIS — H543 Unqualified visual loss, both eyes: Secondary | ICD-10-CM | POA: Diagnosis present

## 2013-11-18 DIAGNOSIS — D649 Anemia, unspecified: Secondary | ICD-10-CM | POA: Diagnosis present

## 2013-11-18 DIAGNOSIS — M949 Disorder of cartilage, unspecified: Secondary | ICD-10-CM

## 2013-11-18 DIAGNOSIS — H548 Legal blindness, as defined in USA: Secondary | ICD-10-CM | POA: Diagnosis present

## 2013-11-18 DIAGNOSIS — K922 Gastrointestinal hemorrhage, unspecified: Secondary | ICD-10-CM | POA: Diagnosis not present

## 2013-11-18 DIAGNOSIS — K920 Hematemesis: Secondary | ICD-10-CM

## 2013-11-18 DIAGNOSIS — I255 Ischemic cardiomyopathy: Secondary | ICD-10-CM | POA: Diagnosis present

## 2013-11-18 DIAGNOSIS — R791 Abnormal coagulation profile: Secondary | ICD-10-CM | POA: Diagnosis not present

## 2013-11-18 DIAGNOSIS — Z7901 Long term (current) use of anticoagulants: Secondary | ICD-10-CM

## 2013-11-18 DIAGNOSIS — I498 Other specified cardiac arrhythmias: Secondary | ICD-10-CM | POA: Diagnosis present

## 2013-11-18 DIAGNOSIS — R894 Abnormal immunological findings in specimens from other organs, systems and tissues: Secondary | ICD-10-CM | POA: Diagnosis not present

## 2013-11-18 DIAGNOSIS — I252 Old myocardial infarction: Secondary | ICD-10-CM

## 2013-11-18 DIAGNOSIS — I5032 Chronic diastolic (congestive) heart failure: Secondary | ICD-10-CM | POA: Diagnosis present

## 2013-11-18 DIAGNOSIS — I214 Non-ST elevation (NSTEMI) myocardial infarction: Principal | ICD-10-CM

## 2013-11-18 HISTORY — DX: Atrioventricular block, complete: I44.2

## 2013-11-18 HISTORY — DX: Non-ST elevation (NSTEMI) myocardial infarction: I21.4

## 2013-11-18 HISTORY — DX: Ischemic cardiomyopathy: I25.5

## 2013-11-18 LAB — PROTIME-INR
INR: 1.84 — ABNORMAL HIGH (ref 0.00–1.49)
Prothrombin Time: 20.7 seconds — ABNORMAL HIGH (ref 11.6–15.2)

## 2013-11-18 LAB — MRSA PCR SCREENING: MRSA by PCR: POSITIVE — AB

## 2013-11-18 LAB — HEPARIN LEVEL (UNFRACTIONATED): Heparin Unfractionated: <0.1 IU/mL — ABNORMAL LOW (ref 0.30–0.70)

## 2013-11-18 LAB — APTT
Activated PTT: 57.9 secs — ABNORMAL HIGH (ref 23.6–35.9)
Activated PTT: 60.6 secs — ABNORMAL HIGH (ref 23.6–35.9)

## 2013-11-18 LAB — MAGNESIUM: Magnesium: 2.2 mg/dL (ref 1.5–2.5)

## 2013-11-18 LAB — TROPONIN I: TROPONIN I: 0.64 ng/mL — AB (ref <0.30)

## 2013-11-18 LAB — PRO B NATRIURETIC PEPTIDE: Pro B Natriuretic peptide (BNP): 7073 pg/mL — ABNORMAL HIGH (ref 0–125)

## 2013-11-18 MED ORDER — RENA-VITE PO TABS
1.0000 | ORAL_TABLET | Freq: Every day | ORAL | Status: DC
  Administered 2013-11-18 – 2013-11-20 (×3): 1 via ORAL
  Filled 2013-11-18 (×4): qty 1

## 2013-11-18 MED ORDER — MUPIROCIN 2 % EX OINT
1.0000 "application " | TOPICAL_OINTMENT | Freq: Two times a day (BID) | CUTANEOUS | Status: AC
  Administered 2013-11-18 – 2013-11-23 (×10): 1 via NASAL
  Filled 2013-11-18 (×2): qty 22

## 2013-11-18 MED ORDER — SEVELAMER CARBONATE 800 MG PO TABS
800.0000 mg | ORAL_TABLET | Freq: Three times a day (TID) | ORAL | Status: DC
  Administered 2013-11-19 – 2013-11-20 (×5): 800 mg via ORAL
  Filled 2013-11-18 (×16): qty 1

## 2013-11-18 MED ORDER — HEPARIN (PORCINE) IN NACL 100-0.45 UNIT/ML-% IJ SOLN
1800.0000 [IU]/h | INTRAMUSCULAR | Status: DC
  Administered 2013-11-19 – 2013-11-21 (×4): 1800 [IU]/h via INTRAVENOUS
  Filled 2013-11-18 (×10): qty 250

## 2013-11-18 MED ORDER — ACETAMINOPHEN 325 MG PO TABS
650.0000 mg | ORAL_TABLET | ORAL | Status: DC | PRN
  Administered 2013-11-19: 650 mg via ORAL
  Filled 2013-11-18: qty 2

## 2013-11-18 MED ORDER — HEPARIN (PORCINE) IN NACL 100-0.45 UNIT/ML-% IJ SOLN
1370.0000 [IU]/h | INTRAMUSCULAR | Status: DC
  Administered 2013-11-18: 1350 [IU]/h via INTRAVENOUS

## 2013-11-18 MED ORDER — CHLORHEXIDINE GLUCONATE CLOTH 2 % EX PADS
6.0000 | MEDICATED_PAD | Freq: Every day | CUTANEOUS | Status: DC
  Administered 2013-11-20 – 2013-11-23 (×4): 6 via TOPICAL

## 2013-11-18 MED ORDER — ONDANSETRON HCL 4 MG/2ML IJ SOLN
4.0000 mg | Freq: Four times a day (QID) | INTRAMUSCULAR | Status: DC | PRN
  Administered 2013-11-21: 4 mg via INTRAVENOUS
  Filled 2013-11-18: qty 2

## 2013-11-18 MED ORDER — SIMVASTATIN 40 MG PO TABS
40.0000 mg | ORAL_TABLET | Freq: Every evening | ORAL | Status: DC
Start: 2013-11-18 — End: 2013-11-22
  Administered 2013-11-18 – 2013-11-20 (×2): 40 mg via ORAL
  Filled 2013-11-18 (×5): qty 1

## 2013-11-18 MED ORDER — NITROGLYCERIN 0.4 MG SL SUBL: 0.4000 mg | SUBLINGUAL_TABLET | SUBLINGUAL | Status: DC | PRN

## 2013-11-18 MED ORDER — SODIUM CHLORIDE 0.9 % IV SOLN: 250.0000 mL | INTRAVENOUS | Status: DC | PRN

## 2013-11-18 MED ORDER — MIDODRINE HCL 5 MG PO TABS
10.0000 mg | ORAL_TABLET | ORAL | Status: DC
  Administered 2013-11-19: 10 mg via ORAL
  Filled 2013-11-18 (×2): qty 2

## 2013-11-18 MED ORDER — ASPIRIN EC 81 MG PO TBEC
81.0000 mg | DELAYED_RELEASE_TABLET | Freq: Every day | ORAL | Status: DC
  Administered 2013-11-19 – 2013-11-21 (×3): 81 mg via ORAL
  Filled 2013-11-18 (×3): qty 1

## 2013-11-18 MED ORDER — DOPAMINE-DEXTROSE 3.2-5 MG/ML-% IV SOLN
2.0000 ug/kg/min | INTRAVENOUS | Status: DC
  Administered 2013-11-18: 5 ug/kg/min via INTRAVENOUS
  Administered 2013-11-19: 12 ug/kg/min via INTRAVENOUS
  Administered 2013-11-19 – 2013-11-21 (×3): 10 ug/kg/min via INTRAVENOUS
  Administered 2013-11-22: 15 ug/kg/min via INTRAVENOUS
  Administered 2013-11-23 (×2): 20 ug/kg/min via INTRAVENOUS
  Filled 2013-11-18 (×10): qty 250

## 2013-11-18 MED ORDER — SODIUM CHLORIDE 0.9 % IJ SOLN
3.0000 mL | Freq: Two times a day (BID) | INTRAMUSCULAR | Status: DC
  Administered 2013-11-18 – 2013-11-21 (×5): 3 mL via INTRAVENOUS
  Administered 2013-11-22: 9 mL via INTRAVENOUS

## 2013-11-18 MED ORDER — FAMOTIDINE 20 MG PO TABS
20.0000 mg | ORAL_TABLET | Freq: Every day | ORAL | Status: DC
  Administered 2013-11-19 – 2013-11-21 (×3): 20 mg via ORAL
  Filled 2013-11-18 (×3): qty 1

## 2013-11-18 MED ORDER — ZOLPIDEM TARTRATE 5 MG PO TABS
5.0000 mg | ORAL_TABLET | Freq: Every evening | ORAL | Status: DC | PRN
  Administered 2013-11-19: 5 mg via ORAL
  Filled 2013-11-18: qty 1

## 2013-11-18 MED ORDER — SODIUM CHLORIDE 0.9 % IJ SOLN: 3.0000 mL | INTRAMUSCULAR | Status: DC | PRN

## 2013-11-18 MED ORDER — ASPIRIN EC 81 MG PO TBEC: 81.0000 mg | DELAYED_RELEASE_TABLET | Freq: Every day | ORAL | Status: DC

## 2013-11-18 MED ORDER — LORAZEPAM 1 MG PO TABS
2.0000 mg | ORAL_TABLET | Freq: Two times a day (BID) | ORAL | Status: DC | PRN
  Administered 2013-11-19 – 2013-11-20 (×3): 2 mg via ORAL
  Filled 2013-11-18 (×3): qty 2

## 2013-11-18 NOTE — H&P (Signed)
Cardiology admission Note    Patient ID: MARQUAVIS HANNEN, MRN: [id number], DOB/AGE: June 21, 1966 48 y.o. Admit date: 11/01/2013 Date of Consult: 11/25/2013  Primary Physician: Trevor Iha, MD Primary Cardiologist:new  Chief Complaint:complete heart block   HPI DETRON CARRAS is a 48 y.o. male  Transferred from [facility] because of complete heart block.  I saw him there earlier today. He presented a couple of days before having fallen out of his scooter and was noted at that time to have a heart rate of 40-50 and hypotension with blood pressures in the 70s. He was thought to have Mobitz 1 heart block and was given dopamine with "worsening". He has been on pressor support for the last 48 hours.  On evaluation of telemetry today it was clear that he was in complete heart block.  Review of old tracings demonstrated first degree AV block as recently as 12/14. Notably however, currently has positive troponins that were elevating at [facility] consistent with non-STEMI. He has a history of coronary disease as suggested by a Myoview scan 2002 which demonstrated an ejection fraction of 40-45% and a dense inferior perfusion defect. No further evaluation has been undertaken according to the patient and his family. They were unaware of the above.  He has end-stage renal disease. This is attributed to uncontrolled hypertension and he began dialysis at the age of 46. He is exhausted all of his venous access. He is currently dialyzed via his left leg. There are no other alternatives. 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  He has a history of blindness associated with shock and hypoperfusion. He recently losing his hearing.  He denies recent chest pain. He is able to transfer but is not able to ambulate.     Past Medical History  Diagnosis Date  . Blindness   . Anxiety   . Hypertension   . Depression   .  GERD (gastroesophageal reflux disease)   . Pneumonia   . Complication of anesthesia     "Wake up in the middle of procedure."  . Dysrhythmia   . Stroke 2003    Legally Blind  . Chronic kidney disease     Tues, Thurs, Sat  . History of blood transfusion   . Myocardial infarction 2000    no cardiologist-  . Cardiomyopathy, ischemic 10/30/2013  . Complete heart block 11/10/2013  . Non-STEMI (non-ST elevated myocardial infarction) 11/17/2013      Surgical History:  Past Surgical History  Procedure Laterality Date  . Arteriovenous graft placement  11-07-1998    right upper extremity   . Arteriovenous graft placement  10-18-1998    right upper extremity  . Cardiac catheterization      Stents  . Arteriovenous graft placement      Right arm x 2  . Artreriovenous      Graft Right leg  . 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  . Peritoneal catheter insertion    . Peritoneal catheter removal    . Colon resection      due to peritonitis  . Nephrostomy      left  . Sp declot avgg  04/19/12    Left Thigh  . Fistulogram Left 01/19/2013    Procedure: FISTULOGRAM;  Surgeon: Fransisco Hertz, MD;  Location: [facility] OR;  Service: Vascular;  Laterality: Left;  SHUNTOGRAM  . Angioplasty Left 01/19/2013  Procedure: Balloon Angioplasty;  Surgeon: Fransisco Hertz, MD;  Location: [facility] OR;  Service: Vascular;  Laterality: Left;  Left thigh fistulogram and balloon angioplasty     Home Meds: Prior to Admission medications   Medication Sig Start Date End Date Taking? Authorizing Provider  aspirin EC 81 MG tablet Take 81 mg by mouth daily.    Historical Provider, MD  aspirin-acetaminophen-caffeine (EXCEDRIN MIGRAINE) [phone] MG per tablet Take 2-3 tablets by mouth every 6 (six) hours as needed for pain.    Historical Provider, MD  b complex-vitamin c-folic acid (NEPHRO-VITE) 0.8 MG TABS Take 0.8 mg by mouth daily.    Historical Provider, MD  famotidine (PEPCID) 20 MG tablet Take 20 mg by mouth  daily.    Historical Provider, MD  hydrOXYzine (ATARAX/VISTARIL) 25 MG tablet Take 25 mg by mouth every 6 (six) hours as needed. For itching    Historical Provider, MD  LORazepam (ATIVAN) 2 MG tablet Take 2 mg by mouth 2 (two) times daily as needed. For anxiety    Historical Provider, MD  midodrine (PROAMATINE) 5 MG tablet Take 10 mg by mouth 3 (three) times a week.     Historical Provider, MD  sevelamer (RENAGEL) 800 MG tablet Take 3,200 mg by mouth 3 (three) times daily with meals.     Historical Provider, MD  simvastatin (ZOCOR) 40 MG tablet Take 40 mg by mouth every evening.    Historical Provider, MD  warfarin (COUMADIN) 5 MG tablet Take 5 mg by mouth daily.    Historical Provider, MD  zolpidem (AMBIEN) 5 MG tablet Take 5 mg by mouth at bedtime as needed for sleep.     Historical Provider, MD    Inpatient Medications:      Allergies: No Known Allergies  History   Social History  . Marital Status: Single    Spouse Name: N/A    Number of Children: N/A  . Years of Education: N/A   Occupational History  . Not on file.   Social History Main Topics  . Smoking status: Never Smoker   . Smokeless tobacco: Never Used  . Alcohol Use: No  . Drug Use: No  . Sexual Activity: Not Currently   Other Topics Concern  . Not on file   Social History Narrative  . No narrative on file     Family History  Problem Relation Age of Onset  . Cancer Father     Lung     ROS:  Please see the history of present illness.     All other systems reviewed and negative.    Physical Exam:  Blood pressure 97/51, pulse 51, temperature 98.4 F (36.9 C), temperature source Oral, resp. rate 21, height 5\' 7"  (1.702 m), weight 191 lb 5.8 oz (86.8 kg), SpO2 93.00%. General: Well developed, well nourished male in no acute distress. Head: Normocephalic, atraumatic, sclera non-icteric, no xanthomas, nares are without discharge. EENT: normal--blind Lymph Nodes:  none Back: without scoliosis/kyphosis , no  CVA tendersness Neck: Negative for carotid bruits. JVD not discerned Lungs: Clear bilaterally to auscultation without wheezes, rales, or rhonchi. Breathing is unlabored. Heart: RRR with  variablle S1 S2.  2/6 systolic murmur  LLSB>>apex , norubs, or gallops appreciated. Abdomen: Soft, non-tender, non-distended with normoactive bowel sounds. No hepatomegaly. No rebound/guarding. No obvious abdominal masses. Msk:  Strength and tone appear normal for age. Extremities: No clubbing or cyanosis. No  edema.  Distal pedal pulses are 2+ and equal bilaterally. Skin: Warm and Dry icythiotic  Neuro: Alert and oriented X 3. CN IV-XII intact  Non ambulatory Psych:  Responds to questions appropriately with a normal affect.      Labs: Cardiac Enzymes No results found for this basename: CKTOTAL, CKMB, TROPONINI,  in the last 72 hours CBC Lab Results  Component Value Date   WBC 8.1 07/29/2013   HGB 13.3 10/03/2013   HCT 39.0 10/03/2013   MCV 91.3 07/29/2013   PLT 210 07/29/2013   PROTIME: No results found for this basename: LABPROT, INR,  in the last 72 hours Chemistry No results found for this basename: NA, K, CL, CO2, BUN, CREATININE, CALCIUM, LABALBU, PROT, BILITOT, ALKPHOS, ALT, AST, GLUCOSE,  in the last 168 hours Lipids No results found for this basename: CHOL,  HDL,  LDLCALC,  TRIG   BNP Pro B Natriuretic peptide (BNP)  Date/Time Value Range Status  05/05/2010  2:40 PM 285.0* 0.0 - 100.0 pg/mL Final  08/29/2009 10:43 PM 175.0* 0.0 - 100.0 pg/mL Final   Miscellaneous Lab Results  Component Value Date   DDIMER  Value: 5.03        AT THE INHOUSE ESTABLISHED CUTOFF VALUE OF 0.48 ug/mL FEU, THIS ASSAY HAS BEEN DOCUMENTED IN THE LITERATURE TO HAVE A SENSITIVITY AND NEGATIVE PREDICTIVE VALUE OF AT LEAST 98 TO 99%.  THE TEST RESULT SHOULD BE CORRELATED WITH AN ASSESSMENT OF THE CLINICAL PROBABILITY OF DVT / VTE.* 03/14/2011    Radiology/Studies:  No results found.  EKG: * Sinus rhythm at about 80  with complete heart block and a narrow QRS escape   Assessment and Plan:     Principal Problem:   Complete heart block Active Problems:   Non-STEMI (non-ST elevated myocardial infarction)   Mechanical complication of other vascular device, implant, and graft   Cardiogenic shock   Cardiomyopathy, ischemic   The patient presented with hypotension and was found to be in complete heart block. This occurs in the context of elevated troponin suggestive but not diagnostic of non-STEMI. My hope is that it is true however and at the complete heart block with a narrow QRS will resolve without needing pacing. The patient has no vascular access and as such pacing would require surgical epicardial lead placement. The family is aware of this. The family is also aware that we'll hope that there'll be resolution of his heart block next 48-72 hours if in fact this is related to his non-STEMI.  He is currently requiring dopamine support for her blood pressure. We will continue this. He is also on heparin and aspirin.  We will alert renal as to his  Presence as he will need dialysis. This may well be a challenge given low blood pressure. This apparently hampered dialysis yesterday.  We will get a 2-D echocardiogram  I discussed vascular access issues with Dr. Kem BoroughsJC from renal. Apparently all are aware about the limited access issues.      Sherryl MangesSteven Belita Warsame  is

## 2013-11-18 NOTE — Consult Note (Addendum)
ANTICOAGULATION CONSULT NOTE - Initial Consult  Pharmacy Consult for Heparin Indication: NSTEMI  No Known Allergies  Patient Measurements: Height: 5\' 7"  (170.2 cm) Weight: 191 lb 5.8 oz (86.8 kg) IBW/kg (Calculated) : 66.1 Heparin Dosing Weight: ~84kg  Vital Signs: Temp: 98.4 F (36.9 C) (01/23 1600) Temp src: Oral (01/23 1600) BP: 97/51 mmHg (01/23 1600) Pulse Rate: 51 (01/23 1600)  Labs: No results found for this basename: HGB, HCT, PLT, APTT, LABPROT, INR, HEPARINUNFRC, CREATININE, CKTOTAL, CKMB, TROPONINI,  in the last 72 hours  Estimated Creatinine Clearance: 9.2 ml/min (by C-G formula based on Cr of 10.3).   Medical History: Past Medical History  Diagnosis Date  . Blindness   . Anxiety   . Hypertension   . Depression   . GERD (gastroesophageal reflux disease)   . Pneumonia   . Complication of anesthesia     "Wake up in the middle of procedure."  . Dysrhythmia   . Stroke 2003    Legally Blind  . Chronic kidney disease     Tues, Thurs, Sat  . History of blood transfusion   . Myocardial infarction 2000    no cardiologist-  . Cardiomyopathy, ischemic 11/21/2013  . Complete heart block 11/09/2013  . Non-STEMI (non-ST elevated myocardial infarction) 11/20/2013    Medications:  Heparin @ 1370 units/hr  Assessment: 47[facility] with NSTEMI and complete heart block. [facility] and was monitored there using aPTTs. His last aPTT at 0400 this morning was 58 seconds (goal 66-102). Of note, he is ESRD on HD. [facility] - Hgb 12.9 and platelets 111. Also on coumadin pta for hx CVA and his last INR was 1.9.  Goal of Therapy:  Heparin level 0.3-0.7 units/ml Monitor platelets by anticoagulation protocol: Yes   Plan:  1) Continue heparin at 1370 units/hr 2) Check a heparin level now 3) Daily heparin level and CBC  Fredrik RiggerMarkle, Raylee Strehl Sue 10/28/2013,5:48 PM   Heparin level is <0.10. No issues with infusion. Will  avoid bolusing since INR is elevated.  Plan: 1) Increase heparin to 1600 units/hr 2) Check another heparin level 8 hours after rate change  Fredrik RiggerMarkle, Tukker Byrns Sue 11/13/2013, 8:37 PM

## 2013-11-18 NOTE — Consult Note (Addendum)
[name] Consultation [facility]    Indication for Consultation:  Management of ESRD/hemodialysis; anemia, hypertension/volume and secondary hyperparathyroidism  HPI: [name] is a 48 y.o. male.  Mr. [name] is a 48yo AAM with an extensive PMH most notable for ESRD, HTN, CAD, antiphospholipid syndrome (on chronic coumadin) with multiple failed accesses who was admitted to [name] after he became lightheaded and fell.  He was found to be bradycardic, hypotensive, and in heart block and was admitted 11/16/13 and started on dopamine.  He ruled in for a NSTEMI and was evaluated by Dr. 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  We were asked to help coordinate his hemodialysis and ESRD-related medical conditions.    He has had multiple issues with clotted AVG's and was recently evaluated by Dr. Imogene Burn on 11/04/13 for some drainage and breakdown on his left thigh AVG (only available vascular access).  Past Medical History  Diagnosis Date  . Blindness   . Anxiety   . Hypertension   . Depression   . GERD (gastroesophageal reflux disease)   . Pneumonia   . Complication of anesthesia     "Wake up in the middle of procedure."  . Dysrhythmia   . Stroke 2003    Legally Blind  . Chronic kidney disease     Tues, Thurs, Sat  . History of blood transfusion   . Myocardial infarction 2000    no cardiologist-  . Cardiomyopathy, ischemic 11/19/2013  . Complete heart block 11-19-13  . Non-STEMI (non-ST elevated myocardial infarction) 11/19/13   Past Surgical History  Procedure Laterality Date  . Arteriovenous graft placement  11-07-1998    right upper extremity   . Arteriovenous graft placement  10-18-1998    right upper extremity  . Cardiac catheterization      Stents  .  Arteriovenous graft placement      Right arm x 2  . Artreriovenous      Graft Right leg  . 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  . Peritoneal catheter insertion    . Peritoneal catheter removal    . Colon resection      due to peritonitis  . Nephrostomy      left  . Sp declot avgg  04/19/12    Left Thigh  . Fistulogram Left 01/19/2013    Procedure: FISTULOGRAM;  Surgeon: Fransisco Hertz, MD;  Location: [name];  Service: Vascular;  Laterality: Left;  SHUNTOGRAM  . Angioplasty Left 01/19/2013    Procedure: Balloon Angioplasty;  Surgeon: Fransisco Hertz, MD;  Location: [name];  Service: Vascular;  Laterality: Left;  Left thigh fistulogram and balloon angioplasty   Family History:   Family History  Problem Relation Age of Onset  . Cancer Father     Lung   Social History:  reports that he has never smoked. He has never used smokeless tobacco. He reports that he does not drink alcohol or use illicit drugs. No Known Allergies Prior to Admission medications   Medication Sig Start Date End Date Taking? Authorizing Provider  aspirin EC 81 MG tablet Take 81 mg by mouth daily.   Yes Historical Provider, MD  aspirin-acetaminophen-caffeine (EXCEDRIN MIGRAINE) [id number] MG per tablet Take 2-3 tablets by mouth every 6 (six) hours as needed for pain.   Yes  Historical Provider, MD  b complex-vitamin c-folic acid (NEPHRO-VITE) 0.8 MG TABS Take 0.8 mg by mouth daily.   Yes Historical Provider, MD  famotidine (PEPCID) 20 MG tablet Take 20 mg by mouth daily.   Yes Historical Provider, MD  hydrOXYzine (ATARAX/VISTARIL) 25 MG tablet Take 25 mg by mouth every 6 (six) hours as needed. For itching   Yes Historical Provider, MD  LORazepam (ATIVAN) 2 MG tablet Take 2 mg by mouth 2 (two) times daily as needed. For anxiety   Yes Historical Provider, MD  midodrine (PROAMATINE) 5 MG tablet Take 10 mg by mouth 3 (three) times a week.    Yes Historical Provider, MD  sevelamer carbonate (RENVELA) 800 MG tablet  Take 3,200 mg by mouth 3 (three) times daily with meals.   Yes Historical Provider, MD  simvastatin (ZOCOR) 40 MG tablet Take 40 mg by mouth every evening.   Yes Historical Provider, MD  warfarin (COUMADIN) 5 MG tablet Take 5 mg by mouth daily.   Yes Historical Provider, MD  zolpidem (AMBIEN) 5 MG tablet Take 5 mg by mouth at bedtime as needed for sleep.    Yes Historical Provider, MD   Current Facility-Administered Medications  Medication Dose Route Frequency Provider Last Rate Last Dose  . 0.9 %  sodium chloride infusion  250 mL Intravenous PRN Thereasa ParkinKathryn Stern, PA-C      . acetaminophen (TYLENOL) tablet 650 mg  650 mg Oral Q4H PRN Thereasa ParkinKathryn Stern, PA-C      . [START ON 11/19/2013] aspirin EC tablet 81 mg  81 mg Oral Daily Thereasa ParkinKathryn Stern, PA-C      . DOPamine (INTROPIN) 800 mg in dextrose 5 % 250 mL infusion  2-5 mcg/kg/min Intravenous Titrated Thereasa ParkinKathryn Stern, PA-C 8.1 mL/hr at 11/20/2013 1750 5 mcg/kg/min at 10/27/2013 1750  . [START ON 11/19/2013] famotidine (PEPCID) tablet 20 mg  20 mg Oral Daily Thereasa ParkinKathryn Stern, PA-C      . LORazepam (ATIVAN) tablet 2 mg  2 mg Oral BID PRN Thereasa ParkinKathryn Stern, PA-C      . midodrine (PROAMATINE) tablet 10 mg  10 mg Oral 3 times weekly Thereasa ParkinKathryn Stern, PA-C      . multivitamin (RENA-VIT) tablet 1 tablet  1 tablet Oral QHS Thereasa ParkinKathryn Stern, PA-C      . nitroGLYCERIN (NITROSTAT) SL tablet 0.4 mg  0.4 mg Sublingual Q5 Min x 3 PRN Thereasa ParkinKathryn Stern, PA-C      . ondansetron [name](ZOFRAN) injection 4 mg  4 mg Intravenous Q6H PRN Thereasa ParkinKathryn Stern, PA-C      . Melene Muller[START ON 11/19/2013] sevelamer carbonate (RENVELA) tablet 800 mg  800 mg Oral TID WC Thereasa ParkinKathryn Stern, PA-C      . simvastatin (ZOCOR) tablet 40 mg  40 mg Oral QPM Thereasa ParkinKathryn Stern, PA-C      . sodium chloride 0.9 % injection 3 mL  3 mL Intravenous Q12H Thereasa ParkinKathryn Stern, PA-C      . sodium chloride 0.9 % injection 3 mL  3 mL Intravenous PRN Thereasa ParkinKathryn Stern, PA-C      . zolpidem (AMBIEN) tablet 5 mg  5 mg Oral QHS PRN Thereasa ParkinKathryn Stern, PA-C        Facility-Administered Medications Ordered in Other Encounters  Medication Dose Route Frequency Provider Last Rate Last Dose  . fentaNYL (SUBLIMAZE) injection   Intravenous PRN Art A Hoss, MD   25 mcg at 06/12/12 1049  . midazolam (VERSED) 5 MG/5ML injection   Intravenous PRN Art A Hoss, MD   1 mg at 06/12/12  1048   Labs: Basic Metabolic Panel: No results found for this basename: NA, K, CL, CO2, GLUCOSE, BUN, CREATININE, CALCIUM, ALB, PHOS,  in the last 168 hours Liver Function Tests: No results found for this basename: AST, ALT, ALKPHOS, BILITOT, PROT, ALBUMIN,  in the last 168 hours No results found for this basename: LIPASE, AMYLASE,  in the last 168 hours No results found for this basename: AMMONIA,  in the last 168 hours CBC: No results found for this basename: WBC, NEUTROABS, HGB, HCT, MCV, PLT,  in the last 168 hours Cardiac Enzymes: No results found for this basename: CKTOTAL, CKMB, CKMBINDEX, TROPONINI,  in the last 168 hours CBG: No results found for this basename: GLUCAP,  in the last 168 hours Iron Studies: No results found for this basename: IRON, TIBC, TRANSFERRIN, FERRITIN,  in the last 72 hours Studies/Results: No results found.  ROS: A comprehensive review of systems was negative except for: Constitutional: positive for weakness/light headedness at time of presentation Cardiovascular: positive for chest pressure/discomfort, fatigue and near-syncope Musculoskeletal: positive for right hip pain Physical Exam: Filed Vitals:   11/06/2013 1600 11/06/2013 1700 11/26/2013 1800  BP: 97/51 101/48 99/44  Pulse: 51 50 45  Temp: 98.4 F (36.9 C)    TempSrc: Oral    Resp: 21 14 12   Height: 5\' 7"  (1.702 m)    Weight: 86.8 kg (191 lb 5.8 oz)    SpO2: 93% 95% 93%      Weight change:   Intake/Output Summary (Last 24 hours) at 11/22/2013 1821 Last data filed at 11/07/2013 1800  Gross per 24 hour  Intake    8.1 ml  Output      0 ml  Net    8.1 ml   BP 99/44  Pulse 45   Temp(Src) 98.4 F (36.9 C) (Oral)  Resp 12  Ht 5\' 7"  (1.702 m)  Wt 86.8 kg (191 lb 5.8 oz)  BMI 29.96 kg/m2  SpO2 93% General appearance: alert, cooperative, no distress and eyes closed (legally blind) Neck: no adenopathy, no carotid bruit, supple, symmetrical, trachea midline and thyroid not enlarged, symmetric, no tenderness/mass/nodules Resp: clear to auscultation bilaterally Cardio: bradycardic, no rub GI: soft, non-tender; bowel sounds normal; no masses,  no organomegaly Extremities: no pitting edema, AVG in L thigh with hematoma over lateral aspect, faint bruit and thirll present (greater on lateral portion of AVG) small ulceration on medial aspect of AVG with bandage covering it Dialysis Access:  Dialysis Orders: [name]: [facility]  on TTS . EDW 86.5 HD Bath 2K/2.25Ca  Time 4.5 hours. Access L thigh AVG BFR 400 DFR 800    Hectoal mcg IV/HD Epogen 1600   Units IV/HD  Venofer  50mg    Assessment/Plan: 1.  Complete Heart block- possibly related to NSTEMI, although if this does not improve, he will likely require an epicardial pacemaker because of his poor vascular access and clotted vessels 2. NSTEMI- per Cardiology 3. Hypotension- in setting of NSTEMI and complete heart block.  On dopamine.  Management per Cardiology.  Normally on midodrine before HD d/t chronic hypotension 4. Vascular access- LAVG with faint thrill and bruit but is patent (likely faint due to hypotension), agree with VVS eval to review central venograms and assess possible approaches for pacemaker if required, as well as to re-evaluate ulceration of AVG 5.  ESRD -  Cont with HD qTTS 6.  Hypertension/volume  - as above, on dopamine 7.  Anemia  - cont with epo 8.  Metabolic bone  disease -  Cont with binders and vitamin D 9.  Nutrition - follow 10. FND- stable 11. antiphspholipid syndrome- on chronic coumadin  Irena Cords, MD [name] [phone] 11/20/2013, 6:21 PM

## 2013-11-19 ENCOUNTER — Encounter (HOSPITAL_COMMUNITY): Payer: Self-pay | Admitting: Emergency Medicine

## 2013-11-19 DIAGNOSIS — I214 Non-ST elevation (NSTEMI) myocardial infarction: Secondary | ICD-10-CM

## 2013-11-19 DIAGNOSIS — N186 End stage renal disease: Secondary | ICD-10-CM

## 2013-11-19 DIAGNOSIS — T82898A Other specified complication of vascular prosthetic devices, implants and grafts, initial encounter: Secondary | ICD-10-CM

## 2013-11-19 DIAGNOSIS — I369 Nonrheumatic tricuspid valve disorder, unspecified: Secondary | ICD-10-CM

## 2013-11-19 LAB — CBC
HCT: 35 % — ABNORMAL LOW (ref 39.0–52.0)
Hemoglobin: 11.6 g/dL — ABNORMAL LOW (ref 13.0–17.0)
MCH: 30.1 pg (ref 26.0–34.0)
MCHC: 33.1 g/dL (ref 30.0–36.0)
MCV: 90.7 fL (ref 78.0–100.0)
Platelets: 180 10*3/uL (ref 150–400)
RBC: 3.86 MIL/uL — AB (ref 4.22–5.81)
RDW: 16.9 % — ABNORMAL HIGH (ref 11.5–15.5)
WBC: 6.2 10*3/uL (ref 4.0–10.5)

## 2013-11-19 LAB — COMPREHENSIVE METABOLIC PANEL
ALT: 10 U/L (ref 0–53)
AST: 15 U/L (ref 0–37)
Albumin: 3.5 g/dL (ref 3.5–5.2)
Alkaline Phosphatase: 120 U/L — ABNORMAL HIGH (ref 39–117)
BUN: 35 mg/dL — ABNORMAL HIGH (ref 6–23)
CALCIUM: 10 mg/dL (ref 8.4–10.5)
CO2: 22 meq/L (ref 19–32)
CREATININE: 6.78 mg/dL — AB (ref 0.50–1.35)
Chloride: 88 mEq/L — ABNORMAL LOW (ref 96–112)
GFR, EST AFRICAN AMERICAN: 10 mL/min — AB (ref >90)
GFR, EST NON AFRICAN AMERICAN: 9 mL/min — AB (ref >90)
GLUCOSE: 91 mg/dL (ref 70–99)
Potassium: 5 mEq/L (ref 3.7–5.3)
Sodium: 134 mEq/L — ABNORMAL LOW (ref 137–147)
TOTAL PROTEIN: 8.2 g/dL (ref 6.0–8.3)
Total Bilirubin: 0.4 mg/dL (ref 0.3–1.2)

## 2013-11-19 LAB — HEPARIN LEVEL (UNFRACTIONATED)
HEPARIN UNFRACTIONATED: 0.14 [IU]/mL — AB (ref 0.30–0.70)
Heparin Unfractionated: 0.49 IU/mL (ref 0.30–0.70)

## 2013-11-19 LAB — BASIC METABOLIC PANEL
BUN: 39 mg/dL — AB (ref 6–23)
CHLORIDE: 87 meq/L — AB (ref 96–112)
CO2: 24 mEq/L (ref 19–32)
CREATININE: 7.46 mg/dL — AB (ref 0.50–1.35)
Calcium: 10.2 mg/dL (ref 8.4–10.5)
GFR calc non Af Amer: 8 mL/min — ABNORMAL LOW (ref >90)
GFR, EST AFRICAN AMERICAN: 9 mL/min — AB (ref >90)
GLUCOSE: 112 mg/dL — AB (ref 70–99)
Potassium: 4.5 mEq/L (ref 3.7–5.3)
Sodium: 133 mEq/L — ABNORMAL LOW (ref 137–147)

## 2013-11-19 LAB — TSH: TSH: 1.071 u[IU]/mL (ref 0.350–4.500)

## 2013-11-19 LAB — TROPONIN I
TROPONIN I: 0.61 ng/mL — AB (ref <0.30)
TROPONIN I: 0.52 ng/mL — AB (ref <0.30)

## 2013-11-19 LAB — LIPID PANEL
CHOLESTEROL: 137 mg/dL (ref 0–200)
HDL: 43 mg/dL (ref >39)
LDL Cholesterol: 74 mg/dL (ref 0–99)
Total CHOL/HDL Ratio: 3.2 RATIO
Triglycerides: 98 mg/dL (ref <150)
VLDL: 20 mg/dL (ref 0–40)

## 2013-11-19 LAB — PROTIME-INR
INR: 2.08 — AB (ref 0.00–1.49)
PROTHROMBIN TIME: 22.7 s — AB (ref 11.6–15.2)

## 2013-11-19 LAB — HEMOGLOBIN A1C
Hgb A1c MFr Bld: 5.4 % (ref <5.7)
MEAN PLASMA GLUCOSE: 108 mg/dL (ref <117)

## 2013-11-19 MED ORDER — LACTULOSE 10 GM/15ML PO SOLN
20.0000 g | Freq: Two times a day (BID) | ORAL | Status: DC | PRN
  Administered 2013-11-19: 20 g via ORAL
  Filled 2013-11-19: qty 30

## 2013-11-19 MED ORDER — OXYCODONE HCL 5 MG PO TABS
10.0000 mg | ORAL_TABLET | ORAL | Status: DC | PRN
  Administered 2013-11-19 – 2013-11-21 (×4): 10 mg via ORAL
  Filled 2013-11-19 (×5): qty 2

## 2013-11-19 NOTE — Progress Notes (Signed)
Subjective: Denies SOB   No CP  Confused about whatis goingon  Objective: Filed Vitals:   11/19/13 0300 11/19/13 0400 11/19/13 0500 11/19/13 0600  BP: 96/46 98/53 88/34  94/42  Pulse: 49 50 49 49  Temp:      TempSrc:      Resp:   13 17  Height:      Weight:   190 lb 7.6 oz (86.4 kg)   SpO2: 95% 96% 99% 81%   Weight change:   Intake/Output Summary (Last 24 hours) at 11/19/13 0725 Last data filed at 11/19/13 0600  Gross per 24 hour  Intake  345.4 ml  Output      0 ml  Net  345.4 ml    General: Alert, awake, oriented x3, in no acute distress Neck:  JVP is normal Heart: Regular rate and rhythm, without murmurs, rubs, gallops.  Lungs: Clear to auscultation.  No rales or wheezes. Exemities:  No edema.  Feet warm Neuro: Grossly intact, nonfocal.  Tele:  CHB  51 bpm  Narrowcomplex. Lab Results: Results for orders placed during the hospital encounter of 11/12/2013 (from the past 24 hour(s))  MRSA PCR SCREENING     Status: Abnormal   Collection Time    10/29/2013  4:17 PM      Result Value Range   MRSA by PCR POSITIVE (*) NEGATIVE  TROPONIN I     Status: Abnormal   Collection Time    11/16/2013  7:38 PM      Result Value Range   Troponin I 0.64 (*) <0.30 ng/mL  PROTIME-INR     Status: Abnormal   Collection Time    10/29/2013  7:38 PM      Result Value Range   Prothrombin Time 20.7 (*) 11.6 - 15.2 seconds   INR 1.84 (*) 0.00 - 1.49  TSH     Status: None   Collection Time    11/17/2013  7:38 PM      Result Value Range   TSH 1.071  0.350 - 4.500 uIU/mL  MAGNESIUM     Status: None   Collection Time    11/25/2013  7:38 PM      Result Value Range   Magnesium 2.2  1.5 - 2.5 mg/dL  HEMOGLOBIN W0J     Status: None   Collection Time    11/19/2013  7:38 PM      Result Value Range   Hemoglobin A1C 5.4  <5.7 %   Mean Plasma Glucose 108  <117 mg/dL  PRO B NATRIURETIC PEPTIDE     Status: Abnormal   Collection Time    11/20/2013  7:38 PM      Result Value Range   Pro B Natriuretic peptide  (BNP) 7073.0 (*) 0 - 125 pg/mL  HEPARIN LEVEL (UNFRACTIONATED)     Status: Abnormal   Collection Time    11/19/2013  7:38 PM      Result Value Range   Heparin Unfractionated <0.10 (*) 0.30 - 0.70 IU/mL  TROPONIN I     Status: Abnormal   Collection Time    11/19/13  1:37 AM      Result Value Range   Troponin I 0.61 (*) <0.30 ng/mL  TROPONIN I     Status: Abnormal   Collection Time    11/19/13  4:10 AM      Result Value Range   Troponin I 0.52 (*) <0.30 ng/mL  COMPREHENSIVE METABOLIC PANEL     Status: Abnormal   Collection Time  11/19/13  4:10 AM      Result Value Range   Sodium 134 (*) 137 - 147 mEq/L   Potassium 5.0  3.7 - 5.3 mEq/L   Chloride 88 (*) 96 - 112 mEq/L   CO2 22  19 - 32 mEq/L   Glucose, Bld 91  70 - 99 mg/dL   BUN 35 (*) 6 - 23 mg/dL   Creatinine, Ser 4.136.78 (*) 0.50 - 1.35 mg/dL   Calcium 24.410.0  8.4 - 01.010.5 mg/dL   Total Protein 8.2  6.0 - 8.3 g/dL   Albumin 3.5  3.5 - 5.2 g/dL   AST 15  0 - 37 U/L   ALT 10  0 - 53 U/L   Alkaline Phosphatase 120 (*) 39 - 117 U/L   Total Bilirubin 0.4  0.3 - 1.2 mg/dL   GFR calc non Af Amer 9 (*) >90 mL/min   GFR calc Af Amer 10 (*) >90 mL/min  LIPID PANEL     Status: None   Collection Time    11/19/13  4:10 AM      Result Value Range   Cholesterol 137  0 - 200 mg/dL   Triglycerides 98  <272<150 mg/dL   HDL 43  >53>39 mg/dL   Total CHOL/HDL Ratio 3.2     VLDL 20  0 - 40 mg/dL   LDL Cholesterol 74  0 - 99 mg/dL  CBC     Status: Abnormal   Collection Time    11/19/13  4:10 AM      Result Value Range   WBC 6.2  4.0 - 10.5 K/uL   RBC 3.86 (*) 4.22 - 5.81 MIL/uL   Hemoglobin 11.6 (*) 13.0 - 17.0 g/dL   HCT 66.435.0 (*) 40.339.0 - 47.452.0 %   MCV 90.7  78.0 - 100.0 fL   MCH 30.1  26.0 - 34.0 pg   MCHC 33.1  30.0 - 36.0 g/dL   RDW 25.916.9 (*) 56.311.5 - 87.515.5 %   Platelets 180  150 - 400 K/uL  PROTIME-INR     Status: Abnormal   Collection Time    11/19/13  4:10 AM      Result Value Range   Prothrombin Time 22.7 (*) 11.6 - 15.2 seconds   INR  2.08 (*) 0.00 - 1.49  HEPARIN LEVEL (UNFRACTIONATED)     Status: Abnormal   Collection Time    11/19/13  4:10 AM      Result Value Range   Heparin Unfractionated 0.14 (*) 0.30 - 0.70 IU/mL    Studies/Results: @RISRSLT24 @  Medications: Reviewed   @PROBHOSP @  1.  CHB  Patiient remains in CHB  Narrow complex  Still on DA (14 mcg/kg/min) Patient is not abulatory  Would like to wean pressors  Follow BP and HR  May stay the same.  PRior BPs have been in 90s Discussed with S Klein  Unfort PPM would be difficult given vasc issues.  And, Would not regain AV synchrony if had PPM  Could consider adding NE or Isoprel to see if increasing HR helps  Would hold for now.    2.  NSTEMI  Minimal change in troponin.  Echo pending.  Continue current regimen  3.  Renal  Patinet usually gets dialysis [facility]  Renal has seen patient  4.  MRSA    LOS: 1 day   Dietrich Patesaula Jelina Paulsen 11/19/2013, 7:25 AM

## 2013-11-19 NOTE — Progress Notes (Signed)
ANTICOAGULATION CONSULT NOTE - Follow Up Consult  Pharmacy Consult for Heparin  Indication: chest pain/ACS  No Known Allergies  Patient Measurements: Height: 5\' 7"  (170.2 cm) Weight: 191 lb 5.8 oz (86.8 kg) IBW/kg (Calculated) : 66.1  Vital Signs: Temp: 98.3 F (36.8 C) (01/23 2000) Temp src: Oral (01/23 2000) BP: 98/53 mmHg (01/24 0400) Pulse Rate: 50 (01/24 0400)  Labs:  Recent Labs  11/10/2013 1938 11/19/13 0137 11/19/13 0410  HGB  --   --  11.6*  HCT  --   --  35.0*  PLT  --   --  180  LABPROT 20.7*  --  22.7*  INR 1.84*  --  2.08*  HEPARINUNFRC <0.10*  --  0.14*  TROPONINI 0.64* 0.61*  --    Estimated Creatinine Clearance: 9.2 ml/min (by C-G formula based on Cr of 10.3).  Medications:  Heparin 1600 units/hr  Assessment: 48 y/o M on heparin for NSTEMI, HL is 0.14, noted ESRD on HD, other labs as above, no issues per RN.   Goal of Therapy:  Heparin level 0.3-0.7 units/ml Monitor platelets by anticoagulation protocol: Yes   Plan:  -Increase heparin to 1800 units/hr -8 hour HL at 1330 -Daily CBC/HL -Monitor for bleeding  Abran DukeLedford, Ildefonso Keaney 11/19/2013,4:57 AM

## 2013-11-19 NOTE — Consult Note (Addendum)
  Asked to see the patient regarding access. Patient has a long history of multiple accesses with multiple failures. He currently is dialyzed via the left femoral loop graft and this is felt to be his last remaining access site. He recently underwent angioplasty of the venous anastomosis with Dr. Imogene Burnhen. He saw Dr. Imogene Burnhen in the office on 11/04/2013 was some concern regarding a small area of erosion over the graft. He was placed on 2 weeks of antibiotics.  Patient was admitted in heart block.  This morning he is awake. He reports that he is confused. He is hemodynamically stable Does have flow in his left femoral loop graft. The area of concern is completely healed there is no evidence of erythema or skin breakdown.  Will not follow actively. Please call if we can assist.

## 2013-11-19 NOTE — Progress Notes (Signed)
ANTICOAGULATION CONSULT NOTE - Follow Up Consult  Pharmacy Consult for Heparin  Indication: chest pain/ACS  No Known Allergies  Labs:  Recent Labs  11/19/2013 1938 11/19/13 0137 11/19/13 0410 11/19/13 0930 11/19/13 1405  HGB  --   --  11.6*  --   --   HCT  --   --  35.0*  --   --   PLT  --   --  180  --   --   LABPROT 20.7*  --  22.7*  --   --   INR 1.84*  --  2.08*  --   --   HEPARINUNFRC <0.10*  --  0.14*  --  0.49  CREATININE  --   --  6.78* 7.46*  --   TROPONINI 0.64* 0.61* 0.52*  --   --    Estimated Creatinine Clearance: 12.7 ml/min (by C-G formula based on Cr of 7.46).   Assessment: 48 y/o M on heparin for NSTEMI. HL therapeutic on heparin 1800 units / hr  Goal of Therapy:  Heparin level 0.3-0.7 units/ml Monitor platelets by anticoagulation protocol: Yes   Plan:  -Increase heaprin to 1800 units / hr -Follow up AM labs  Thank you. Okey RegalLisa Hedwig Mcfall, PharmD [phone] 11/19/2013,3:49 PM

## 2013-11-19 NOTE — Progress Notes (Signed)
Patient ID: [name], male   DOB: 08/21/1966, 48 y.o.   MRN: [id number]  [facility] Progress Note    Subjective:   Feels a little confused today but otherwise no new complaints   Objective:   BP 96/46  Pulse 51  Temp(Src) 97.3 F (36.3 C) (Oral)  Resp 12  Ht 5\' 7"  (1.702 m)  Wt 86.4 kg (190 lb 7.6 oz)  BMI 29.83 kg/m2  SpO2 93%  Intake/Output: I/O last 3 completed shifts: In: 375.9 [I.V.:375.9] Out: -    Intake/Output this shift:  Total I/O In: 237.5 [P.O.:200; I.V.:37.5] Out: -  Weight change:   Physical Exam: Gen:WD AAM in NAd WUJ:[id number]:bradycardic at 49 Resp:cta AOZ:HYQMVHAbd:benign Ext:L thigh AVG +T/B, tr edema  Labs: BMET  Recent Labs Lab 11/19/13 0410  NA 134*  K 5.0  CL 88*  CO2 22  GLUCOSE 91  BUN 35*  CREATININE 6.78*  ALBUMIN 3.5  CALCIUM 10.0   CBC  Recent Labs Lab 11/19/13 0410  WBC 6.2  HGB 11.6*  HCT 35.0*  MCV 90.7  PLT 180    @IMGRELPRIORS @ Medications:    . aspirin EC  81 mg Oral Daily  . Chlorhexidine Gluconate Cloth  6 each Topical Q0600  . famotidine  20 mg Oral Daily  . midodrine  10 mg Oral Q T,Th,Sa-HD  . multivitamin  1 tablet Oral QHS  . mupirocin ointment  1 application Nasal BID  . sevelamer carbonate  800 mg Oral TID WC  . simvastatin  40 mg Oral QPM  . sodium chloride  3 mL Intravenous Q12H     Dialysis Orders: Center: [facility] on TTS .  EDW 86.5 HD Bath 2K/2.25Ca Time 4.5 hours. Access L thigh AVG BFR 400 DFR 800  Hectoal 4mcg mcg IV/HD Epogen 1600 Units IV/HD Venofer 50mg   Assessment/Plan:  1. Complete Heart block- persists and will likely require an epicardial pacemaker because of his poor vascular access and clotted vessels 1. May benefit from repeat central venogram to explore options 2. NSTEMI- per Cardiology 3. Hypotension- in setting of NSTEMI and complete heart block. On dopamine. Management per Cardiology. Normally on midodrine before HD d/t chronic hypotension.  At baseline BP for  now. 4. Vascular access- LAVG with faint thrill and bruit but is patent (likely faint due to hypotension), agree with VVS eval to review central venograms and assess possible approaches for pacemaker if required. 5. ESRD - Cont with HD qTTS 6. Hypertension/volume - as above, on dopamine 7. Anemia - cont with epo 8. Metabolic bone disease - Cont with binders and vitamin D 9. Nutrition - follow 10. 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 Dispo- difficult situation.  Appreciate Cardiology's assistance.  Jamarques Pinedo A 11/19/2013, 9:53 AM

## 2013-11-19 NOTE — Progress Notes (Signed)
  Echocardiogram 2D Echocardiogram has been performed.  Crystal Scarberry FRANCES 11/19/2013, 5:21 PM

## 2013-11-20 DIAGNOSIS — R570 Cardiogenic shock: Secondary | ICD-10-CM

## 2013-11-20 LAB — CBC
HCT: 36.6 % — ABNORMAL LOW (ref 39.0–52.0)
HEMOGLOBIN: 11.8 g/dL — AB (ref 13.0–17.0)
MCH: 29.4 pg (ref 26.0–34.0)
MCHC: 32.2 g/dL (ref 30.0–36.0)
MCV: 91.3 fL (ref 78.0–100.0)
Platelets: 200 10*3/uL (ref 150–400)
RBC: 4.01 MIL/uL — ABNORMAL LOW (ref 4.22–5.81)
RDW: 16.9 % — ABNORMAL HIGH (ref 11.5–15.5)
WBC: 7.9 10*3/uL (ref 4.0–10.5)

## 2013-11-20 LAB — HEPARIN LEVEL (UNFRACTIONATED): Heparin Unfractionated: 0.53 IU/mL (ref 0.30–0.70)

## 2013-11-20 MED ORDER — ALUM & MAG HYDROXIDE-SIMETH 200-200-20 MG/5ML PO SUSP
30.0000 mL | Freq: Once | ORAL | Status: AC
  Administered 2013-11-20: 30 mL via ORAL
  Filled 2013-11-20: qty 30

## 2013-11-20 NOTE — Progress Notes (Signed)
ANTICOAGULATION CONSULT NOTE - Follow Up Consult  Pharmacy Consult for Heparin  Indication: chest pain/ACS  No Known Allergies  Labs:  Recent Labs  11/13/2013 1938 11/19/13 0137 11/19/13 0410 11/19/13 0930 11/19/13 1405 11/20/13 0335  HGB  --   --  11.6*  --   --  11.8*  HCT  --   --  35.0*  --   --  36.6*  PLT  --   --  180  --   --  200  LABPROT 20.7*  --  22.7*  --   --   --   INR 1.84*  --  2.08*  --   --   --   HEPARINUNFRC <0.10*  --  0.14*  --  0.49 0.53  CREATININE  --   --  6.78* 7.46*  --   --   TROPONINI 0.64* 0.61* 0.52*  --   --   --    Estimated Creatinine Clearance: 12.6 ml/min (by C-G formula based on Cr of 7.46).   Assessment: 48 y/o M with ESR[facility]-HD [facility] and complete heart block. [facility], and pharmacy was consulted to continue.  Heparin level this morning was therapeutic at 0.53, second level therapeutic on this rate of 1800 units/hr.  H/H is low but stable, and Plt are wnl.  No reported issues with bleeding.  Goal of Therapy:  Heparin level 0.3-0.7 units/ml Monitor platelets by anticoagulation protocol: Yes   Plan:  - continue heparin IV gtt at 1800 units/hr - daily HL/CBC - monitor for s/s of bleeding  Harrold DonathNathan E. Achilles Dunkope, PharmD Clinical Pharmacist - Resident Pager: [phone] Pharmacy: [phone] 11/20/2013 3:30 PM

## 2013-11-20 NOTE — Progress Notes (Addendum)
Subjective:  Denies SOB   No CP  Confused about what is going on   Meds:  . aspirin EC  81 mg Oral Daily  . Chlorhexidine Gluconate Cloth  6 each Topical Q0600  . famotidine  20 mg Oral Daily  . midodrine  10 mg Oral Q T,Th,Sa-HD  . multivitamin  1 tablet Oral QHS  . mupirocin ointment  1 application Nasal BID  . sevelamer carbonate  800 mg Oral TID WC  . simvastatin  40 mg Oral QPM  . sodium chloride  3 mL Intravenous Q12H   . DOPamine 10 mcg/kg/min (11/19/13 2145)  . heparin 1,800 Units/hr (11/19/13 2146)   Objective:  Filed Vitals:   11/20/13 0500 11/20/13 0600 11/20/13 0800 11/20/13 0841  BP: 93/42 98/41 81/33  109/51  Pulse:  45 44 49  Temp:   97.5 F (36.4 C)   TempSrc:   Axillary   Resp: 23 16 16 14   Height:      Weight: 187 lb 13.3 oz (85.2 kg)     SpO2:  100% 91% 97%   Weight change: -1 lb 8.7 oz (-0.7 kg)  Intake/Output Summary (Last 24 hours) at 11/20/13 1040 Last data filed at 11/20/13 0800  Gross per 24 hour  Intake 874.81 ml  Output    900 ml  Net -25.19 ml   General: Alert, awake, oriented x3, in no acute distress Neck:  JVP is normal Heart: Regular rate and rhythm, without murmurs, rubs, gallops.  Lungs: Clear to auscultation.  No rales or wheezes. Exemities:  No edema.  Feet warm Neuro: Grossly intact, nonfocal.  Tele:  CHB  47-51 bpm  Narrowcomplex. Lab Results: Results for orders placed during the hospital encounter of 11/17/2013 (from the past 24 hour(s))  HEPARIN LEVEL (UNFRACTIONATED)     Status: None   Collection Time    11/19/13  2:05 PM      Result Value Range   Heparin Unfractionated 0.49  0.30 - 0.70 IU/mL  HEPARIN LEVEL (UNFRACTIONATED)     Status: None   Collection Time    11/20/13  3:35 AM      Result Value Range   Heparin Unfractionated 0.53  0.30 - 0.70 IU/mL  CBC     Status: Abnormal   Collection Time    11/20/13  3:35 AM      Result Value Range   WBC 7.9  4.0 - 10.5 K/uL   RBC 4.01 (*) 4.22 - 5.81 MIL/uL   Hemoglobin 11.8 (*) 13.0 - 17.0 g/dL   HCT 16.1 (*) 09.6 - 04.5 %   MCV 91.3  78.0 - 100.0 fL   MCH 29.4  26.0 - 34.0 pg   MCHC 32.2  30.0 - 36.0 g/dL   RDW 40.9 (*) 81.1 - 91.4 %   Platelets 200  150 - 400 K/uL    Echocardiogram 11/19/2013  - Left ventricle: The cavity size was normal. Wall thickness was normal. Systolic function was normal. The estimated ejection fraction was 55%. There is hypokinesis of the basalinferior and inferoseptal myocardium. Features are consistent with a pseudonormal left ventricular filling pattern, with concomitant abnormal relaxation and increased filling pressure (grade 2 diastolic dysfunction). - Ventricular septum: The contour showed diastolic flattening and systolic flattening. - Aortic valve: Moderately calcified annulus. Probably trileaflet; mildly calcified leaflets. No significant regurgitation. - Mitral valve: Calcified annulus. Moderately thickened, moderately calcified leaflets . Leaflet separation was mildly reduced. Trivial regurgitation. Mean gradient: 2mm Hg (D). - Left atrium: The  atrium was moderately dilated. - Right ventricle: The cavity size was severely dilated. The moderator band was prominent. Systolic function was severely reduced. - Right atrium: The atrium was moderately dilated. Central venous pressure: 8mm Hg (est). - Tricuspid valve: Mild regurgitation. - Pulmonary arteries: Systolic pressure was moderately increased. PA peak pressure: 49mm Hg (S). - Pericardium, extracardiac: There was no pericardial effusion. Apparent thickening of the posterior pericardium. Impressions:  - Normal LV wall thickness with LVEF approximately 55%, basal inferior and inferoseptal hypokinesis. Grade 2 diastolic dysfunction. Moderate biatrial enlargement. MAC with moderately thickened and calcified mitral valve (no stenosis by mean gradient) and trivial tricuspid regurgitation. Severe RV dilatation and contractile dysfunction with  evidence of pressure and volume overload. Mild tricuspid regurgitation with PASP 49 mmHg. Apparent thickenimg of the posterior pericardium.   Assessment and Plan:  1.  CHB  Patiient remains in CHB  Narrow complex  Still on Dopamin (14 mcg/kg/min) and midodrine Patient is not ambulatory  Would like to wean pressors  Follow BP and HR  May stay the same.  PRior BPs have been in 90s Discussed with S Klein  Unfort PPM would be difficult given vasc issues.  And, Would not regain AV synchrony if had PPM  Could consider adding NE or Isoprel to see if increasing HR helps  Would hold for now.    2.  NSTEMI  Minimal change in troponin.  Echo shows hypokinesis of the inferior wall and severe right ventricular dilatation and systolic dysfunction. This might be possibly due to infarct in the RCA territory (RV infarct and inferior wall infarct). Cath should be considered, however this might not be possible due to limited access, we will discuss with Dr Graciela HusbandsKlein.    3.  Renal  Patinet usually gets dialysis T Th SAt  Renal has seen patient  4.  MRSA    LOS: 2 days   [name], [name], H 11/20/2013, 10:40 AM

## 2013-11-20 NOTE — Progress Notes (Signed)
Patient ID: [name], male   DOB: 05/29/1966, 48 y.o.   MRN: [id number]   [facility] Progress Note    Subjective:   No new complaints and tolerated HD well   Objective:   BP 109/51  Pulse 49  Temp(Src) 97.5 F (36.4 C) (Axillary)  Resp 14  Ht 5\' 7"  (1.702 m)  Wt 85.2 kg (187 lb 13.3 oz)  BMI 29.41 kg/m2  SpO2 97%  Intake/Output: I/O last 3 completed shifts: In: 1516.3 [P.O.:320; I.V.:1196.3] Out: 900 [Other:900]   Intake/Output this shift:  Total I/O In: 34.3 [I.V.:34.3] Out: -  Weight change: -0.7 kg (-1 lb 8.7 oz)  Physical Exam: Gen:NAD WUJ:[id number]:bradycardic Resp:cta AOZ:HYQMVHAbd:benign Ext:tr edema, L Thigh AVG +T/B  Labs: BMET  Recent Labs Lab 11/19/13 0410 11/19/13 0930  NA 134* 133*  K 5.0 4.5  CL 88* 87*  CO2 22 24  GLUCOSE 91 112*  BUN 35* 39*  CREATININE 6.78* 7.46*  ALBUMIN 3.5  --   CALCIUM 10.0 10.2   CBC  Recent Labs Lab 11/19/13 0410 11/20/13 0335  WBC 6.2 7.9  HGB 11.6* 11.8*  HCT 35.0* 36.6*  MCV 90.7 91.3  PLT 180 200    @IMGRELPRIORS @ Medications:    . aspirin EC  81 mg Oral Daily  . Chlorhexidine Gluconate Cloth  6 each Topical Q0600  . famotidine  20 mg Oral Daily  . midodrine  10 mg Oral Q T,Th,Sa-HD  . multivitamin  1 tablet Oral QHS  . mupirocin ointment  1 application Nasal BID  . sevelamer carbonate  800 mg Oral TID WC  . simvastatin  40 mg Oral QPM  . sodium chloride  3 mL Intravenous Q12H     Dialysis Orders: Center: [facility] on TTS .  EDW 86.5 HD Bath 2K/2.25Ca Time 4.5 hours. Access L thigh AVG BFR 400 DFR 800  Hectoal 4mcg mcg IV/HD Epogen 1600 Units IV/HD Venofer 50mg   Assessment/Plan:  1. Complete Heart block- persists and will likely require an epicardial pacemaker because of his poor vascular access and clotted vessels  1. May benefit from repeat central venogram to explore options for possible transvenous pacemaker rather than epircardial. 2. NSTEMI- per Cardiology 3. Hypotension- in setting  of NSTEMI and complete heart block. On dopamine. Management per Cardiology. Normally on midodrine before HD d/t chronic hypotension. At baseline BP for now. 4. Vascular access- L thigh AVG with faint thrill and bruit but is patent (likely faint due to hypotension), agree with VVS eval to review central venograms and assess possible approaches for pacemaker if required. 1. VVS without recommendations, would plan for central venogram with IR and explore possible options 5. ESRD - tolerated HD well yesterday.  Cont with HD qTTS 6. Hypertension/volume - as above, on dopamine 7. Anemia - cont with epo 8. Metabolic bone disease - Cont with binders and vitamin D 9. Nutrition - follow 10. 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 Dispo- difficult situation. Appreciate Cardiology's assistance. 13.   [name] 11/20/2013, 10:01 AM

## 2013-11-21 ENCOUNTER — Inpatient Hospital Stay (HOSPITAL_COMMUNITY): Payer: Medicare Other

## 2013-11-21 DIAGNOSIS — K922 Gastrointestinal hemorrhage, unspecified: Secondary | ICD-10-CM | POA: Diagnosis not present

## 2013-11-21 DIAGNOSIS — J9600 Acute respiratory failure, unspecified whether with hypoxia or hypercapnia: Secondary | ICD-10-CM

## 2013-11-21 DIAGNOSIS — I2589 Other forms of chronic ischemic heart disease: Secondary | ICD-10-CM

## 2013-11-21 DIAGNOSIS — I442 Atrioventricular block, complete: Secondary | ICD-10-CM

## 2013-11-21 DIAGNOSIS — K920 Hematemesis: Secondary | ICD-10-CM

## 2013-11-21 LAB — POCT I-STAT 3, ART BLOOD GAS (G3+)
ACID-BASE EXCESS: 2 mmol/L (ref 0.0–2.0)
Acid-Base Excess: 5 mmol/L — ABNORMAL HIGH (ref 0.0–2.0)
BICARBONATE: 28.3 meq/L — AB (ref 20.0–24.0)
Bicarbonate: 29.5 mEq/L — ABNORMAL HIGH (ref 20.0–24.0)
O2 Saturation: 100 %
O2 Saturation: 57 %
PH ART: 7.333 — AB (ref 7.350–7.450)
PO2 ART: 370 mmHg — AB (ref 80.0–100.0)
Patient temperature: 98.6
Patient temperature: 99.9
TCO2: 30 mmol/L (ref 0–100)
TCO2: 31 mmol/L (ref 0–100)
pCO2 arterial: 41.1 mmHg (ref 35.0–45.0)
pCO2 arterial: 53.8 mmHg — ABNORMAL HIGH (ref 35.0–45.0)
pH, Arterial: 7.463 — ABNORMAL HIGH (ref 7.350–7.450)
pO2, Arterial: 34 mmHg — CL (ref 80.0–100.0)

## 2013-11-21 LAB — RENAL FUNCTION PANEL
ALBUMIN: 3.5 g/dL (ref 3.5–5.2)
BUN: 28 mg/dL — AB (ref 6–23)
CO2: 23 mEq/L (ref 19–32)
CREATININE: 6.14 mg/dL — AB (ref 0.50–1.35)
Calcium: 10.3 mg/dL (ref 8.4–10.5)
Chloride: 89 mEq/L — ABNORMAL LOW (ref 96–112)
GFR calc Af Amer: 11 mL/min — ABNORMAL LOW (ref >90)
GFR calc non Af Amer: 10 mL/min — ABNORMAL LOW (ref >90)
GLUCOSE: 93 mg/dL (ref 70–99)
POTASSIUM: 4.3 meq/L (ref 3.7–5.3)
Phosphorus: 6.6 mg/dL — ABNORMAL HIGH (ref 2.3–4.6)
Sodium: 138 mEq/L (ref 137–147)

## 2013-11-21 LAB — CBC
HCT: 34.7 % — ABNORMAL LOW (ref 39.0–52.0)
HCT: 27.9 % — ABNORMAL LOW (ref 39.0–52.0)
HEMATOCRIT: 36 % — AB (ref 39.0–52.0)
HEMOGLOBIN: 11.9 g/dL — AB (ref 13.0–17.0)
HEMOGLOBIN: 11.1 g/dL — AB (ref 13.0–17.0)
HEMOGLOBIN: 9.2 g/dL — AB (ref 13.0–17.0)
MCH: 30 pg (ref 26.0–34.0)
MCH: 30.2 pg (ref 26.0–34.0)
MCH: 29.3 pg (ref 26.0–34.0)
MCHC: 33.1 g/dL (ref 30.0–36.0)
MCHC: 32 g/dL (ref 30.0–36.0)
MCHC: 33 g/dL (ref 30.0–36.0)
MCV: 90.7 fL (ref 78.0–100.0)
MCV: 94.3 fL (ref 78.0–100.0)
MCV: 88.9 fL (ref 78.0–100.0)
PLATELETS: 264 10*3/uL (ref 150–400)
Platelets: 219 10*3/uL (ref 150–400)
Platelets: 222 10*3/uL (ref 150–400)
RBC: 3.97 MIL/uL — ABNORMAL LOW (ref 4.22–5.81)
RBC: 3.68 MIL/uL — AB (ref 4.22–5.81)
RBC: 3.14 MIL/uL — ABNORMAL LOW (ref 4.22–5.81)
RDW: 16.8 % — AB (ref 11.5–15.5)
RDW: 17.1 % — ABNORMAL HIGH (ref 11.5–15.5)
RDW: 16.1 % — ABNORMAL HIGH (ref 11.5–15.5)
WBC: 7.2 10*3/uL (ref 4.0–10.5)
WBC: 11.2 10*3/uL — ABNORMAL HIGH (ref 4.0–10.5)
WBC: 10.6 10*3/uL — AB (ref 4.0–10.5)

## 2013-11-21 LAB — BLOOD GAS, ARTERIAL
ACID-BASE DEFICIT: 4.9 mmol/L — AB (ref 0.0–2.0)
Bicarbonate: 19.1 mEq/L — ABNORMAL LOW (ref 20.0–24.0)
FIO2: 0.5 %
LHR: 30 {breaths}/min
O2 Saturation: 98.9 %
PEEP: 10 cmH2O
PH ART: 7.395 (ref 7.350–7.450)
PO2 ART: 131 mmHg — AB (ref 80.0–100.0)
Patient temperature: 98.6
Pressure control: 14 cmH2O
TCO2: 20.1 mmol/L (ref 0–100)
pCO2 arterial: 31.9 mmHg — ABNORMAL LOW (ref 35.0–45.0)

## 2013-11-21 LAB — TYPE AND SCREEN
ABO/RH(D): B POS
Antibody Screen: NEGATIVE

## 2013-11-21 LAB — PROTIME-INR
INR: 2.98 — ABNORMAL HIGH (ref 0.00–1.49)
Prothrombin Time: 29.9 seconds — ABNORMAL HIGH (ref 11.6–15.2)

## 2013-11-21 LAB — LACTIC ACID, PLASMA: LACTIC ACID, VENOUS: 1 mmol/L (ref 0.5–2.2)

## 2013-11-21 LAB — HEPARIN LEVEL (UNFRACTIONATED): HEPARIN UNFRACTIONATED: 0.38 [IU]/mL (ref 0.30–0.70)

## 2013-11-21 MED ORDER — CHLORHEXIDINE GLUCONATE 0.12 % MT SOLN
15.0000 mL | Freq: Two times a day (BID) | OROMUCOSAL | Status: DC
  Administered 2013-11-21 – 2013-11-23 (×4): 15 mL via OROMUCOSAL
  Filled 2013-11-21 (×4): qty 15

## 2013-11-21 MED ORDER — ETOMIDATE 2 MG/ML IV SOLN
INTRAVENOUS | Status: AC
  Administered 2013-11-21: 20 mg
  Filled 2013-11-21: qty 20

## 2013-11-21 MED ORDER — BIOTENE DRY MOUTH MT LIQD
1.0000 | Freq: Four times a day (QID) | OROMUCOSAL | Status: DC
Start: 2013-11-21 — End: 2013-11-21
  Administered 2013-11-21: 15 mL via OROMUCOSAL

## 2013-11-21 MED ORDER — BIOTENE DRY MOUTH MT LIQD
15.0000 mL | Freq: Four times a day (QID) | OROMUCOSAL | Status: DC
  Administered 2013-11-22 – 2013-11-23 (×7): 15 mL via OROMUCOSAL

## 2013-11-21 MED ORDER — SODIUM CHLORIDE 0.9 % IV SOLN
8.0000 mg/h | INTRAVENOUS | Status: DC
  Administered 2013-11-21 – 2013-11-23 (×4): 8 mg/h via INTRAVENOUS
  Filled 2013-11-21 (×12): qty 80

## 2013-11-21 MED ORDER — CHLORHEXIDINE GLUCONATE 0.12 % MT SOLN
15.0000 mL | Freq: Two times a day (BID) | OROMUCOSAL | Status: DC
  Administered 2013-11-21: 15 mL via OROMUCOSAL
  Filled 2013-11-21: qty 15

## 2013-11-21 MED ORDER — ETOMIDATE 2 MG/ML IV SOLN
0.3000 mg/kg | Freq: Once | INTRAVENOUS | Status: AC
  Administered 2013-11-21: 20 mg via INTRAVENOUS

## 2013-11-21 MED ORDER — VITAMIN K1 10 MG/ML IJ SOLN
10.0000 mg | Freq: Once | INTRAVENOUS | Status: AC
  Administered 2013-11-21: 10 mg via INTRAVENOUS
  Filled 2013-11-21: qty 1

## 2013-11-21 MED ORDER — SODIUM CHLORIDE 0.9 % IV BOLUS (SEPSIS)
500.0000 mL | INTRAVENOUS | Status: AC
  Administered 2013-11-21: 500 mL via INTRAVENOUS

## 2013-11-21 MED ORDER — SODIUM CHLORIDE 0.9 % IV SOLN
1.0000 mg/h | INTRAVENOUS | Status: DC
  Administered 2013-11-21 – 2013-11-23 (×4): 4 mg/h via INTRAVENOUS
  Filled 2013-11-21 (×5): qty 10

## 2013-11-21 MED ORDER — ROCURONIUM BROMIDE 50 MG/5ML IV SOLN
INTRAVENOUS | Status: AC
  Administered 2013-11-21: 50 mg
  Filled 2013-11-21: qty 2

## 2013-11-21 MED ORDER — MIDAZOLAM HCL 2 MG/2ML IJ SOLN
INTRAMUSCULAR | Status: AC
Start: 2013-11-21 — End: 2013-11-21
  Administered 2013-11-21: 2 mg
  Filled 2013-11-21: qty 2

## 2013-11-21 MED ORDER — SODIUM CHLORIDE 0.9 % IV SOLN
25.0000 ug/h | INTRAVENOUS | Status: DC
  Administered 2013-11-21: 200 ug/h via INTRAVENOUS
  Administered 2013-11-22: 150 ug/h via INTRAVENOUS
  Administered 2013-11-22: 200 ug/h via INTRAVENOUS
  Filled 2013-11-21 (×4): qty 50

## 2013-11-21 MED ORDER — SUCCINYLCHOLINE CHLORIDE 20 MG/ML IJ SOLN
INTRAMUSCULAR | Status: AC
  Filled 2013-11-21: qty 1

## 2013-11-21 MED ORDER — LIDOCAINE HCL (CARDIAC) 20 MG/ML IV SOLN
INTRAVENOUS | Status: AC
  Filled 2013-11-21: qty 5

## 2013-11-21 MED ORDER — SODIUM CHLORIDE 0.9 % IV SOLN
INTRAVENOUS | Status: DC
  Administered 2013-11-21: 13:00:00 via INTRAVENOUS

## 2013-11-21 MED ORDER — FENTANYL CITRATE 0.05 MG/ML IJ SOLN
INTRAMUSCULAR | Status: AC
  Administered 2013-11-21: 100 ug
  Filled 2013-11-21: qty 2

## 2013-11-21 MED ORDER — NOREPINEPHRINE BITARTRATE 1 MG/ML IJ SOLN
2.0000 ug/min | INTRAVENOUS | Status: DC
  Administered 2013-11-21 (×2): 10 ug/min via INTRAVENOUS
  Filled 2013-11-21 (×2): qty 4

## 2013-11-21 MED ORDER — SODIUM CHLORIDE 0.9 % IV SOLN
80.0000 mg | INTRAVENOUS | Status: AC
  Administered 2013-11-21: 80 mg via INTRAVENOUS
  Filled 2013-11-21: qty 80

## 2013-11-21 MED FILL — Heparin Sodium (Porcine) 100 Unt/ML in Sodium Chloride 0.45%: INTRAMUSCULAR | Qty: 250 | Status: AC

## 2013-11-21 MED FILL — Dopamine in Dextrose 5% Inj 3.2 MG/ML: INTRAVENOUS | Qty: 250 | Status: AC

## 2013-11-21 NOTE — Consult Note (Signed)
PULMONARY / CRITICAL CARE MEDICINE  Name: [name] MRN: [id number] DOB: [birth year]    ADMISSION DATE:  11/26/2013 CONSULTATION DATE:  11/21/2013  REFERRING MD :  Cardiology PRIMARY SERVICE:  Cardiology  CHIEF COMPLAINT:  Acute upper GI hemorrhage  BRIEF PATIENT DESCRIPTION: 48 yo with past medical history of  Ischemic cardiomyopathy and ESRD on HD transferred from [facility] on 1/23 to cardiology service due to complete heart block associated with bradycardia and hypotension.  On 1/26 developed acute upper GI hemorrhage / hematemesis.  SIGNIFICANT EVENTS / STUDIES:  1/23  Transferred for [facility] in complete heart block 1/24  TTE >>> Preserved EF, diastolic dysfunction  1/26  Hematemesis, PCCM consulted, intubated for airway protection  LINES / TUBES: OETT 1/16 >>>  CULTURES: 1/23  MRSA PCR >>> POSITIVE  ANTIBIOTICS:  The patient is encephalopathic and unable to provide history, which was obtained for available medical records.  HISTORY OF PRESENT ILLNESS:  48 yo with past medical history of  Ischemic cardiomyopathy and ESRD on HD transferred from [facility] on 1/23 to cardiology service due to complete heart block associated with bradycardia and hypotension.  On 1/26 developed acute upper GI hemorrhage / hematemesis.  PAST MEDICAL HISTORY :  Past Medical History  Diagnosis Date  . Blindness   . Anxiety   . Hypertension   . Depression   . GERD (gastroesophageal reflux disease)   . Pneumonia   . Complication of anesthesia     "Wake up in the middle of procedure."  . Dysrhythmia   . Stroke 2003    Legally Blind  . Chronic kidney disease     Tues, Thurs, Sat  . History of blood transfusion   . Myocardial infarction 2000    no cardiologist-  . Cardiomyopathy, ischemic 11/15/2013  . Complete heart block 11/15/2013  . Non-STEMI (non-ST elevated myocardial infarction) 11/13/2013   Past Surgical History  Procedure Laterality Date  . Arteriovenous graft placement  11-07-1998    right  upper extremity   . Arteriovenous graft placement  10-18-1998    right upper extremity  . Cardiac catheterization      Stents  . Arteriovenous graft placement      Right arm x 2  . Artreriovenous      Graft Right leg  . 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  . Peritoneal catheter insertion    . Peritoneal catheter removal    . Colon resection      due to peritonitis  . Nephrostomy      left  . Sp declot avgg  04/19/12    Left Thigh  . Fistulogram Left 01/19/2013    Procedure: FISTULOGRAM;  Surgeon: Fransisco Hertz, MD;  Location: [facility] OR;  Service: Vascular;  Laterality: Left;  SHUNTOGRAM  . Angioplasty Left 01/19/2013    Procedure: Balloon Angioplasty;  Surgeon: Fransisco Hertz, MD;  Location: [facility] OR;  Service: Vascular;  Laterality: Left;  Left thigh fistulogram and balloon angioplasty   Prior to Admission medications   Medication Sig Start Date End Date Taking? Authorizing Provider  aspirin EC 81 MG tablet Take 81 mg by mouth daily.   Yes Historical Provider, MD  aspirin-acetaminophen-caffeine (EXCEDRIN MIGRAINE) [id number] MG per tablet Take 2-3 tablets by mouth every 6 (six) hours as needed for pain.   Yes Historical Provider, MD  b complex-vitamin c-folic acid (NEPHRO-VITE) 0.8 MG TABS Take 0.8 mg by mouth daily.   Yes Historical Provider, MD  famotidine (  PEPCID) 20 MG tablet Take 20 mg by mouth daily.   Yes Historical Provider, MD  hydrOXYzine (ATARAX/VISTARIL) 25 MG tablet Take 25 mg by mouth every 6 (six) hours as needed. For itching   Yes Historical Provider, MD  LORazepam (ATIVAN) 2 MG tablet Take 2 mg by mouth 2 (two) times daily as needed. For anxiety   Yes Historical Provider, MD  midodrine (PROAMATINE) 5 MG tablet Take 10 mg by mouth 3 (three) times a week.    Yes Historical Provider, MD  sevelamer carbonate (RENVELA) 800 MG tablet Take 3,200 mg by mouth 3 (three) times daily with meals.   Yes Historical Provider, MD  simvastatin (ZOCOR) 40 MG tablet Take 40 mg by  mouth every evening.   Yes Historical Provider, MD  warfarin (COUMADIN) 5 MG tablet Take 5 mg by mouth daily.   Yes Historical Provider, MD  zolpidem (AMBIEN) 5 MG tablet Take 5 mg by mouth at bedtime as needed for sleep.    Yes Historical Provider, MD   No Known Allergies  FAMILY HISTORY:  Family History  Problem Relation Age of Onset  . Cancer Father     Lung   SOCIAL HISTORY:  reports that he has never smoked. He has never used smokeless tobacco. He reports that he does not drink alcohol or use illicit drugs.  REVIEW OF SYSTEMS:  Unable to provide.  INTERVAL HISTORY:  VITAL SIGNS: Temp:  [96.9 F (36.1 C)-98.4 F (36.9 C)] 96.9 F (36.1 C) (01/26 1100) Pulse Rate:  [39-185] 46 (01/26 1144) Resp:  [10-28] 12 (01/26 1144) BP: (83-112)/(33-50) 88/38 mmHg (01/26 1144) SpO2:  [38 %-100 %] 83 % (01/26 1144) Weight:  [85.9 kg (189 lb 6 oz)] 85.9 kg (189 lb 6 oz) (01/26 0500)  HEMODYNAMICS:   VENTILATOR SETTINGS:   INTAKE / OUTPUT: Intake/Output     01/25 0701 - 01/26 0700 01/26 0701 - 01/27 0700   P.O. 600    I.V. (mL/kg) 651.7 (7.6)    Total Intake(mL/kg) 1251.7 (14.6)    Other     Total Output       Net +1251.7           PHYSICAL EXAMINATION: General:  Appears acutely ill, mechanically ventilated, synchronous Neuro:  Encephalopathic, nonfocal, cough / gag diminished HEENT:  PERRL, OETT, blood in oral cavity Cardiovascular:  Bradycardic, regular Lungs:  Bilateral air entry, coarse rhonchi Abdomen:  Soft, nontender, bowel sounds diminished Musculoskeletal:  Moves all extremities Skin:  Intact  LABS: CBC  Recent Labs Lab 11/20/13 0335 11/21/13 0605 11/21/13 1318  WBC 7.9 7.2 11.2*  HGB 11.8* 11.9* 11.1*  HCT 36.6* 36.0* 34.7*  PLT 200 219 222   Coag's  Recent Labs Lab 11/02/2013 1938 11/19/13 0410  INR 1.84* 2.08*   BMET  Recent Labs Lab 11/19/13 0410 11/19/13 0930 11/21/13 0605  NA 134* 133* 138  K 5.0 4.5 4.3  CL 88* 87* 89*  CO2 22 24  23   BUN 35* 39* 28*  CREATININE 6.78* 7.46* 6.14*  GLUCOSE 91 112* 93   Electrolytes  Recent Labs Lab 11/04/2013 1938 11/19/13 0410 11/19/13 0930 11/21/13 0605  CALCIUM  --  10.0 10.2 10.3  MG 2.2  --   --   --   PHOS  --   --   --  6.6*   Sepsis Markers No results found for this basename: LATICACIDVEN, PROCALCITON, O2SATVEN,  in the last 168 hours ABG No results found for this basename: PHART, PCO2ART, PO2ART,  in the last 168 hours Liver Enzymes  Recent Labs Lab 11/19/13 0410 11/21/13 0605  AST 15  --   ALT 10  --   ALKPHOS 120*  --   BILITOT 0.4  --   ALBUMIN 3.5 3.5   Cardiac Enzymes  Recent Labs Lab 11/13/2013 1938 11/19/13 0137 11/19/13 0410  TROPONINI 0.64* 0.61* 0.52*  PROBNP 7073.0*  --   --    Glucose No results found for this basename: GLUCAP,  in the last 168 hours  CXR:  1/26 >>>  ASSESSMENT / PLAN:  PULMONARY A:   Acute respiratory failure in setting of possible aspiration Aspiration pneumonitis vs pneumonia P:   Goal SpO2>92, pH>7.30 Full mechanical support Daily SBT Ventilator bundle Trend ABG / CXR  CARDIOVASCULAR A:  Complete heart block / bradycardia NSTEMI? Shock secondary to bradycardia / acute blood and volume loss CAD / non-ischemic cardiomyopathy Chronic diastolic heart failure Poor IV acces ( IJ cannot be visualized with US bilaterally / L Robertson difficulty threading / bilateral femoral grafts? ) P:  Cardiology following, needs pacer but poor candidate Goal MAP>65, HR>60 Trend troponin / lactate Levophed to goal MAP Dopamine to goal HR Request IR to place IV access if necessary Hold ASA Zocor when able Midodrine when able  RENAL A:   ESRD on HD P:   Renal following Trend BMP  GASTROINTESTINAL A:   Upper GI hemorrhage P:   GI following EGD planned NPO Protonix gtt D/c IVF  HEMATOLOGIC A:   Coagulopathy secondary to Warfarin Suspect acute blood loss anemia P:  Trend CBC q6h Hold Heparin /  Coumadin FFP x 2 Vit K 10 x 1 SCD  INFECTIOUS A:   No active issues  P:   No intervention required  ENDOCRINE  A:   No active issues P:   No intervention required  NEUROLOGIC A:  Acute encephalopathy P:   Goal RASS 0 to -1 Daily WUA Fentanyl / Versed gtt  I have personally obtained history, examined patient, evaluated and interpreted laboratory and imaging results, reviewed medical records, formulated assessment / plan and placed orders.  CRITICAL CARE:  The patient is critically ill with multiple organ systems failure and requires high complexity decision making for assessment and support, frequent evaluation and titration of therapies, application of advanced monitoring technologies and extensive interpretation of multiple databases. Critical Care Time devoted to patient care services described in this note is 60 minutes.   Lonia FarberZUBELEVITSKIY, Teigen Parslow, MD Pulmonary and Critical Care Medicine [facility] Pager: [phone]  11/21/2013, 2:44 PM

## 2013-11-21 NOTE — Procedures (Signed)
Name: [name] MRN: [id number] DOB: 07/19/1966   PROCEDURE NOTE  Procedure:  Endotracheal intubation.  Indication:  Acute respiratory failure  Consent:  Consent was implied due to the emergency nature of the procedure.  Anesthesia:  A total of 10 mg of Etomidate was given intravenously.  Procedure summary:  Appropriate equipment was assembled. The patient was identified as [name] and safety timeout was performed. The patient was placed supine, with head in sniffing position. After adequate level of anesthesia was achieved, a GS#3 blade was inserted into the oropharynx and the vocal cords were visualized. A 8.0 endotracheal tube was inserted without difficulty and visualized going through the vocal cords. The stylette was removed and cuff inflated. Colorimetric change was noted on the CO2 meter. Breath sounds were heard over both lung fields equally. ETT was secured.  Post procedure chest xray was ordered.  Complications:  No immediate complications were noted.  Hemodynamic parameters and oxygenation remained stable throughout the procedure.    Orlean BradfordK. Andrew Soria, M.D. Pulmonary and Critical Care Medicine [facility] Pager: [phone]  11/21/2013, 3:35 PM

## 2013-11-21 NOTE — Consult Note (Signed)
[facility] Consult: 2:42 PM 11/21/2013  LOS: 3 days    Referring Provider: dr Debara Pickett  Primary Care Physician:  Placido Sou, MD Primary Gastroenterologist: unassigned     Reason for Consultation:  Acute single episode of hematemesis.    HPI: [name] is a 48 y.o. male.  Chronically ill.  Numerous medical problems.  He is blind and is on HD for end stage renal disease.  He has CHF.  S/p 2002 sigmoid resection for diverticulitis.  Admitted to [place] after falling out of electric wheelchair.  Was in [place] 1 heart block, progressed to CHB and transferred to [facility] 3 days ago .  Ruled in for non STEMI.  Treated with dopamine, heparin drip.  This morning had single episode of large volume Hematemesis and heparin stopped.  BPs persistently low since arrival, in fact was 80s /50s at midnite.    PPI drip initiated.  INR is elevated, Hgb 11.5 at transfer yesterday,  11.1 at 1300 today after the emesis at noon Waiting on another set of coags.   Pt says he had previous colonoscopy and EGD.  Suspect it was in [place], as no records of GI studies exist in [place].  He takes about 8 Anacin daily for frequent headaches.  Takes Pepcid at home.   Appetite generally good, but poor po intake in last several days.  No nausea or emesis before today. Normally daily BMs, not bloody.  However it has been about 5 days since last BM.  Denies abdominal pain.  Observed abdominal bloating is normal for him    Past Medical History  Diagnosis Date  . Blindness   . Anxiety   . Hypertension   . Depression   . GERD (gastroesophageal reflux disease)   . Pneumonia   . Complication of anesthesia     "Wake up in the middle of procedure."  . Dysrhythmia   . Stroke 2003    Legally Blind  . Chronic kidney disease     Tues,  Thurs, Sat  . History of blood transfusion   . Myocardial infarction 2000    no cardiologist-  . Cardiomyopathy, ischemic 11/10/2013  . Complete heart block 11/09/2013  . Non-STEMI (non-ST elevated myocardial infarction) 10/29/2013    Past Surgical History  Procedure Laterality Date  . Arteriovenous graft placement  11-07-1998    right upper extremity   . Arteriovenous graft placement  10-18-1998    right upper extremity  . Cardiac catheterization      Stents  . Arteriovenous graft placement      Right arm x 2  . Artreriovenous      Graft Right leg  . 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  . Peritoneal catheter insertion    . Peritoneal catheter removal    . Colon resection      due to peritonitis  . Nephrostomy      left  . Sp declot avgg  04/19/12    Left Thigh  . Fistulogram Left 01/19/2013    Procedure:  FISTULOGRAM;  Surgeon: Conrad Bridgetown, MD;  Location: [place];  Service: Vascular;  Laterality: Left;  SHUNTOGRAM  . Angioplasty Left 01/19/2013    Procedure: Balloon Angioplasty;  Surgeon: Conrad Tennyson, MD;  Location: [place];  Service: Vascular;  Laterality: Left;  Left thigh fistulogram and balloon angioplasty    Prior to Admission medications   Medication Sig Start Date End Date Taking? Authorizing Provider  aspirin EC 81 MG tablet Take 81 mg by mouth daily.   Yes Historical Provider, MD  aspirin-acetaminophen-caffeine (EXCEDRIN MIGRAINE) [phone] MG per tablet Take 2-3 tablets by mouth every 6 (six) hours as needed for pain.   Yes Historical Provider, MD  b complex-vitamin c-folic acid (NEPHRO-VITE) 0.8 MG TABS Take 0.8 mg by mouth daily.   Yes Historical Provider, MD  famotidine (PEPCID) 20 MG tablet Take 20 mg by mouth daily.   Yes Historical Provider, MD  hydrOXYzine (ATARAX/VISTARIL) 25 MG tablet Take 25 mg by mouth every 6 (six) hours as needed. For itching   Yes Historical Provider, MD  LORazepam (ATIVAN) 2 MG tablet Take 2 mg by mouth 2 (two) times daily as  needed. For anxiety   Yes Historical Provider, MD  midodrine (PROAMATINE) 5 MG tablet Take 10 mg by mouth 3 (three) times a week.    Yes Historical Provider, MD  sevelamer carbonate (RENVELA) 800 MG tablet Take 3,200 mg by mouth 3 (three) times daily with meals.   Yes Historical Provider, MD  simvastatin (ZOCOR) 40 MG tablet Take 40 mg by mouth every evening.   Yes Historical Provider, MD  warfarin (COUMADIN) 5 MG tablet Take 5 mg by mouth daily.   Yes Historical Provider, MD  zolpidem (AMBIEN) 5 MG tablet Take 5 mg by mouth at bedtime as needed for sleep.    Yes Historical Provider, MD    Scheduled Meds: . aspirin EC  81 mg Oral Daily  . Chlorhexidine Gluconate Cloth  6 each Topical Q0600  . famotidine  20 mg Oral Daily  . midodrine  10 mg Oral Q T,Th,Sa-HD  . multivitamin  1 tablet Oral QHS  . mupirocin ointment  1 application Nasal BID  . sevelamer carbonate  800 mg Oral TID WC  . simvastatin  40 mg Oral QPM  . sodium chloride  3 mL Intravenous Q12H   Infusions: . sodium chloride 75 mL/hr at 11/21/13 1307  . DOPamine 10 mcg/kg/min (11/21/13 0904)  . heparin 1,800 Units/hr (11/21/13 0246)  . pantoprozole (PROTONIX) infusion     PRN Meds: sodium chloride, acetaminophen, lactulose, LORazepam, nitroGLYCERIN, ondansetron (ZOFRAN) IV, oxyCODONE, sodium chloride, zolpidem   Allergies as of 10/28/2013  . (No Known Allergies)    Family History  Problem Relation Age of Onset  . Cancer Father     Lung    History   Social History  . Marital Status: Single    Spouse Name: N/A    Number of Children: N/A  . Years of Education: N/A   Occupational History  . Not on file.   Social History Main Topics  . Smoking status: Never Smoker   . Smokeless tobacco: Never Used  . Alcohol Use: No  . Drug Use: No  . Sexual Activity: Not Currently   Other Topics Concern  . Not on file   Social History Narrative  . No narrative on file    REVIEW OF SYSTEMS: Constitutional:  Stable  weight ENT:  No nose bleeds Pulm:  No SOB or cough CV:  No  palpitations, no LE edema.  GU:  anuric GI:  Per HPI  Heme:  + anemia.  Transfusions:  Yes in ~ 2002 or 2003 Neuro:  No headaches, no peripheral tingling or numbness Derm:  No itching, no rash or sores.  Endocrine:  No sweats or chills.  No polyuria or dysuria Immunization:  Flu shot current Travel:  None beyond local counties in last few months.    PHYSICAL EXAM: Vital signs in last 24 hours: Filed Vitals:   11/21/13 1144  BP: 88/38  Pulse: 46  Temp:   Resp: 12   Wt Readings from Last 3 Encounters:  11/21/13 85.9 kg (189 lb 6 oz)  11/04/13 86.183 kg (190 lb)  10/03/13 86.5 kg (190 lb 11.2 oz)    General: ill looking AAM.  Comfortable and alert Head:  No swelling or asymmetry  Eyes:  No icterus or pallor Ears:  Not HOH  Nose:  No congestion or discharge Mouth:  Moist, clear Neck:  No mass Lungs:  Clear in front Heart: RRR Abdomen:  Tight and distended.   No mass.  BS present Rectal: not done   Musc/Skeltl: contractures of legs. Deformity of the knees Extremities:  Feet cool.  No mottling.  Thrill in thigh AVG Neurologic:  Oriented x 3.  Good historian.  No tremor.  Moves all 4s Skin:  No rash.  Some healing small sores on the legs Tattoos:  none Nodes:  No cervical adenopathy   Psych:  Pleasant, affect subdued.  Relaxed   Intake/Output from previous day: 01/25 0701 - 01/26 0700 In: 1251.7 [P.O.:600; I.V.:651.7] Out: -  Intake/Output this shift:    LAB RESULTS:  Recent Labs  11/20/13 0335 11/21/13 0605 11/21/13 1318  WBC 7.9 7.2 11.2*  HGB 11.8* 11.9* 11.1*  HCT 36.6* 36.0* 34.7*  PLT 200 219 222   BMET Lab Results  Component Value Date   NA 138 11/21/2013   NA 133* 11/19/2013   NA 134* 11/19/2013   K 4.3 11/21/2013   K 4.5 11/19/2013   K 5.0 11/19/2013   CL 89* 11/21/2013   CL 87* 11/19/2013   CL 88* 11/19/2013   CO2 23 11/21/2013   CO2 24 11/19/2013   CO2 22 11/19/2013   GLUCOSE 93  11/21/2013   GLUCOSE 112* 11/19/2013   GLUCOSE 91 11/19/2013   BUN 28* 11/21/2013   BUN 39* 11/19/2013   BUN 35* 11/19/2013   CREATININE 6.14* 11/21/2013   CREATININE 7.46* 11/19/2013   CREATININE 6.78* 11/19/2013   CALCIUM 10.3 11/21/2013   CALCIUM 10.2 11/19/2013   CALCIUM 10.0 11/19/2013   LFT  Recent Labs  11/19/13 0410 11/21/13 0605  PROT 8.2  --   ALBUMIN 3.5 3.5  AST 15  --   ALT 10  --   ALKPHOS 120*  --   BILITOT 0.4  --    PT/INR Lab Results  Component Value Date   INR 2.08* 11/19/2013   INR 1.84* 11/20/2013   INR 1.17 10/03/2013   Hepatitis Panel No results found for this basename: HEPBSAG, HCVAB, HEPAIGM, HEPBIGM,  in the last 72 hours C-Diff No components found with this basename: cdiff   Lipase  No results found for this basename: lipase    Drugs of Abuse  No results found for this basename: labopia,  cocainscrnur,  labbenz,  amphetmu,  thcu,  labbarb     RADIOLOGY STUDIES: No results found.  ENDOSCOPIC STUDIES: Nothing found  IMPRESSION:   *  Acute hematemesis  x one.  Heparin discontinued.  PPI drip initiated.  Take 8 Excedrin per day so high liklihood this is an NSAID ulcer.   *  Coagulopathy.  Pt has not gotten Warfarin. LFTs normal except for  Alk phos of 120, no hx of liver disease.   *  CHB and non STEMI.    *  ESRD.  All vascular access exhausted and dializes via catheter in his leg.      PLAN:     *  CCM plans to "premptively" intubate pt now.   *  Waiting on new set of coags.lab will re collect the stat order from 1253 today.   *  Will need INR at or closer to normal before EGD.  Orders for EGD in [place].   Azucena Freed  11/21/2013, 2:42 PM Pager: [phone]  GI ATTENDING  History, laboratories, x-rays reviewed. Patient personally seen and examined. Agree with H&P as outlined above. The patient is now intubated. His mother is in the room. Very complicated case with multiple significant chronic and acute medical problems. Complicated  by hematemesis while anticoagulated. Prothrombin time is elevated currently. No further bleeding after initial episode. Agree with plans for intubation. Transfuse as needed. I know you would like the ProTime to drift due to his known coagulopathy. However, if significant recurrent bleeding occurs she will need to give FFP and vitamin K. With plans for endoscopy tomorrow his coagulation parameters improved reasonably. We'll follow closely with you. Discussed with his mother. Thank you  Docia Chuck. Geri Seminole., M.D. [facility] Division of Gastroenterology

## 2013-11-21 NOTE — Progress Notes (Signed)
Patient ID: [name], male   DOB: 11/06/1965, 48 y.o.   MRN: [id number] Subjective:  C/o abdominal discomfort. No sob. Blood pressure is low.  Objective:  Vital Signs in the last 24 hours: Temp:  [96.9 F (36.1 C)-98.4 F (36.9 C)] 96.9 F (36.1 C) (01/26 1100) Pulse Rate:  [39-185] 46 (01/26 1144) Resp:  [10-28] 12 (01/26 1144) BP: (83-112)/(33-50) 88/38 mmHg (01/26 1144) SpO2:  [38 %-100 %] 83 % (01/26 1144) Weight:  [189 lb 6 oz (85.9 kg)] 189 lb 6 oz (85.9 kg) (01/26 0500)  Intake/Output from previous day: 01/25 0701 - 01/26 0700 In: 1251.7 [P.O.:600; I.V.:651.7] Out: -  Intake/Output from this shift:    Physical Exam: anxious appearing NAD HEENT: Unremarkable Neck:  7 cmJVD, no thyromegally Back:  No CVA tenderness Lungs:  Clear except for scattered rales HEART:  Regular rate rhythm, no murmurs, no rubs, no clicks Abd:  soft, positive bowel sounds, no organomegally, no rebound, no guarding Ext:  2 plus pulses, no edema, no cyanosis, no clubbing Skin:  No rashes no nodules Neuro:  CN II through XII intact, motor grossly intact  Lab Results:  Recent Labs  11/21/13 0605 11/21/13 1318  WBC 7.2 11.2*  HGB 11.9* 11.1*  PLT 219 222    Recent Labs  11/19/13 0930 11/21/13 0605  NA 133* 138  K 4.5 4.3  CL 87* 89*  CO2 24 23  GLUCOSE 112* 93  BUN 39* 28*  CREATININE 7.46* 6.14*    Recent Labs  11/19/13 0137 11/19/13 0410  TROPONINI 0.61* 0.52*   Hepatic Function Panel  Recent Labs  11/19/13 0410 11/21/13 0605  PROT 8.2  --   ALBUMIN 3.5 3.5  AST 15  --   ALT 10  --   ALKPHOS 120*  --   BILITOT 0.4  --     Recent Labs  11/19/13 0410  CHOL 137   No results found for this basename: PROTIME,  in the last 72 hours  Imaging: No results found.  Cardiac Studies: Tele - nsr with CHB and a ventricular escape of 40-50/min Assessment/Plan:  1. CHB - he has a reasonable escape rhythm. His pacing options are poor. He would almost cerainly  need an epicardial PPM. 2. Hematemesis - will ask for GI input 3. Hypotension 4. ESRD on HD Rec: The situation is critical. We have stopped his heparin. He will get fluids but difficult with access. He has peripheral IV. His dialysis site is in the left leg. Will ask for CCM assistance.   LOS: 3 days    Oris Staffieri,M.D. 11/21/2013, 2:58 PM

## 2013-11-21 NOTE — Progress Notes (Addendum)
CCU Acute Intervention Note:  Called to the bedside emergently for patient who is vomiting blood. Copious amounts of blood was noted.  Brief oral exam shows no trauma. He was on a heparin gtts which was stopped this morning. He appears to be maintaining his airway. O2 saturations are 95%.  Will start a protonix gtts. STAT H/H and type and screen.  Place NG tube to LIWS. Keep NPO. Will need GI consult.  Would bolus 500 cc normal saline now for hypotension. - then saline infusion at 75 cc/hr.  CRITICAL CARE:  The patient is critically ill with multi-organ system failure and requires high complexity decision making for assessment and support, frequent evaluation and titration of therapies, application of advanced monitoring technologies and extensive interpretation of multiple databases.  TIME SPENT STABILIZING PATIENT: 35 minutes  Chrystie NoseKenneth C. Rafia Shedden, MD, [facility] Attending Cardiologist [facility]

## 2013-11-21 NOTE — Progress Notes (Signed)
Patient ID: [name], male   DOB: 06/18/1966, 48 y.o.   MRN: [id number]  [facility] Progress Note    Subjective:   Just had large volume hematemesis with BRB and clots, no SOB or CP or abd pain    Objective:   BP 88/38  Pulse 46  Temp(Src) 96.9 F (36.1 C) (Oral)  Resp 12  Ht 5\' 7"  (1.702 m)  Wt 85.9 kg (189 lb 6 oz)  BMI 29.65 kg/m2  SpO2 83%  Intake/Output: I/O last 3 completed shifts: In: 1817.6 [P.O.:720; I.V.:1097.6] Out: -    Intake/Output this shift:    Weight change: -0.2 kg (-7.1 oz)  Physical Exam: Gen:NAD ZHY:QMVHQIONGEXCVS:bradycardic Resp:cta BMW:UXLKGMAbd:benign Ext:tr edema, L Thigh AVG +T/B  Labs: BMET  Recent Labs Lab 11/19/13 0410 11/19/13 0930 11/21/13 0605  NA 134* 133* 138  K 5.0 4.5 4.3  CL 88* 87* 89*  CO2 22 24 23   GLUCOSE 91 112* 93  BUN 35* 39* 28*  CREATININE 6.78* 7.46* 6.14*  ALBUMIN 3.5  --  3.5  CALCIUM 10.0 10.2 10.3  PHOS  --   --  6.6*   CBC  Recent Labs Lab 11/19/13 0410 11/20/13 0335 11/21/13 0605  WBC 6.2 7.9 7.2  HGB 11.6* 11.8* 11.9*  HCT 35.0* 36.6* 36.0*  MCV 90.7 91.3 90.7  PLT 180 200 219    @IMGRELPRIORS @ Medications:    . aspirin EC  81 mg Oral Daily  . Chlorhexidine Gluconate Cloth  6 each Topical Q0600  . famotidine  20 mg Oral Daily  . midodrine  10 mg Oral Q T,Th,Sa-HD  . multivitamin  1 tablet Oral QHS  . mupirocin ointment  1 application Nasal BID  . pantoprazole (PROTONIX) IV  80 mg Intravenous STAT  . sevelamer carbonate  800 mg Oral TID WC  . simvastatin  40 mg Oral QPM  . sodium chloride  500 mL Intravenous STAT  . sodium chloride  3 mL Intravenous Q12H     Dialysis Orders: Center: [facility] on TTS .  EDW 86.5 HD Bath 2K/2.25Ca Time 4.5 hours. Access L thigh AVG BFR 400 DFR 800  Hectoal 4mcg mcg IV/HD Epogen 1600 Units IV/HD Venofer 50mg    Assessment/Plan 1. NSTEMI - per cardiology 2. Complete Heart block- heart block persists and may need epicardial pacemaker because of his poor  vascular  per Cardiology in setting of NSTEMI and complete heart block. On dopamine. Management per Cardiology. Normally on midodrine before HD d/t chronic hypotension. At baseline BP for now 3. Hematemesis- new, no hep with HD tomorrow 4. Vascular access- L thigh AVG with faint thrill and bruit but is patent (likely faint due to hypotension), agree with VVS eval to review central venograms and assess possible approaches for pacemaker 5. ESRD - cont HD TTS 6. Hypertension/volume - as above, on dopamine, midodrine at home, at dry weight, BP 90's, no vol excess on exam. Keep even with HD tomorrow 7. Anemia - cont with epo 8. Metabolic bone disease - Cont with binders and vitamin D 9. Nutrition - follow 10. 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 Dispo- difficult situation. Appreciate Cardiology's assistance.   Vinson Moselleob Hyde Sires MD (pgr) [phone]    ([phone]) [phone] 11/21/2013, 1:05 PM

## 2013-11-21 NOTE — Progress Notes (Signed)
ANTICOAGULATION CONSULT NOTE - Follow Up Consult  Pharmacy Consult for Heparin Indication: NSTEMI  No Known Allergies  Patient Measurements: Height: 5\' 7"  (170.2 cm) Weight: 189 lb 6 oz (85.9 kg) IBW/kg (Calculated) : 66.1 Heparin Dosing Weight: 84 kg  Vital Signs: Temp: 97.1 F (36.2 C) (01/26 0700) Temp src: Oral (01/26 0700) BP: 85/33 mmHg (01/26 0700) Pulse Rate: 44 (01/26 0700)  Labs:  Recent Labs  11/07/2013 1938 11/19/13 0137  11/19/13 0410 11/19/13 0930 11/19/13 1405 11/20/13 0335 11/21/13 0605  HGB  --   --   < > 11.6*  --   --  11.8* 11.9*  HCT  --   --   --  35.0*  --   --  36.6* 36.0*  PLT  --   --   --  180  --   --  200 219  LABPROT 20.7*  --   --  22.7*  --   --   --   --   INR 1.84*  --   --  2.08*  --   --   --   --   HEPARINUNFRC <0.10*  --   --  0.14*  --  0.49 0.53 0.38  CREATININE  --   --   --  6.78* 7.46*  --   --  6.14*  TROPONINI 0.64* 0.61*  --  0.52*  --   --   --   --   < > = values in this interval not displayed.  Estimated Creatinine Clearance: 15.4 ml/min (by C-G formula based on Cr of 6.14).  Assessment: 48 y/o M with ESRD-HD [facility] with NSTEMI and complete heart block. Also on coumadin pta for hx CVA and his last INR was 1.9.  [facility]. Heparin level 0.38 in goal. CBC stable this am.  Infectious Disease: Afebrile. MRSA PCR +.  Cardiovascular: 85/33, HR 44 (32-185). EF 55% on Echo. Pt in CHB. May need pacemaker. Meds: ASA81, Midodrine, Zocor, DA drip  Endocrinology: Glucose 93  Gastrointestinal / Nutrition: Pepcid  Neurology  Nephrology: ESRD TTS. Ca 10.3. Renvela  Pulmonary: 97% RZ  Hematology / Oncology: Anemia of chronic dz  PTA Medication Issues: PRN meds.  Best Practices: Mupirocin, Pepcid, Heparin  Goal of Therapy:  Heparin level 0.3-0.7 units/ml Monitor platelets by anticoagulation protocol: Yes   Plan:  Continue IV heparin at 1800 units/hr. F/u  plan   Baron Parmelee S. Merilynn Finlandobertson, PharmD, BCPS Clinical Staff Pharmacist Pager [phone]  Misty Stanleyobertson, Aleesha Ringstad Stillinger 11/21/2013,9:57 AM

## 2013-11-21 NOTE — Procedures (Signed)
Arterial Catheter Insertion Procedure Note Raechel AcheDonald R Fadeley [id number] 01/30/1966  Procedure: Insertion of Arterial Catheter  Indications: Blood pressure monitoring and Frequent blood sampling  Procedure Details Consent: Risks of procedure as well as the alternatives and risks of each were explained to the (patient/caregiver).  Consent for procedure obtained. and Unable to obtain consent because of emergent medical necessity. Time Out: Verified patient identification, verified procedure, site/side was marked, verified correct patient position, special equipment/implants available, medications/allergies/relevent history reviewed, required imaging and test results available.  Performed  Maximum sterile technique was used including antiseptics, cap, gloves, hand hygiene, mask and sheet. Skin prep: Chlorhexidine; local anesthetic administered 22 gauge catheter was inserted into right radial artery using the Seldinger technique.  Evaluation Blood flow good; BP tracing good. Complications: No apparent complications. Positive allen test arterial pressure 113/42.  Leafy HalfSnider, Airon Sahni Dale 11/21/2013

## 2013-11-21 NOTE — Care Management Note (Signed)
    Page 1 of 1   11/21/2013     1:08:26 PM   CARE MANAGEMENT NOTE 11/21/2013  Patient:  [name],[name]   Account Number:  [id number]  Date Initiated:  11/21/2013  Documentation initiated by:  Junius CreamerWELL,DEBBIE  Subjective/Objective Assessment:   adm w mi and complete heart block     Action/Plan:   live alone, pcp dr deterding   Anticipated DC Date:     Anticipated DC Plan:           Choice offered to / List presented to:             Status of service:   Medicare Important Message given?   (If response is "NO", the following Medicare IM given date fields will be blank) Date Medicare IM given:   Date Additional Medicare IM given:    Discharge Disposition:    Per UR Regulation:  Reviewed for med. necessity/level of care/duration of stay  If discussed at Long Length of Stay Meetings, dates discussed:    Comments:

## 2013-11-21 NOTE — Progress Notes (Signed)
Patient ID: [name], male   DOB: 10/17/1966, 48 y.o.   MRN: [id number]   Request had been made by Renal MD for central venogram to evaluate for pacemaker access if needed per Cardiology.  Notes indicate patient is unlikely a candidate for pacemaker per Cardio. Now with new UGI bleed---on hold for any procedure now regardless.  Imaging of previous venogram of 2010 and 2011 was reviewed by Dr Archer AsaMcCullough on weekend.  Occlusions were noted Bilaterally. Repeat central venogram is unnecessary per Dr Archer AsaMcCullough.  Occlusions were noted then and will remain now.  Will be available if need us for anything. Let us know

## 2013-11-21 NOTE — Significant Event (Signed)
On several pressors.  Will have aline placed.  Coralyn HellingVineet Lilinoe Acklin, MD 11/21/2013, 9:24 PM

## 2013-11-21 NOTE — Progress Notes (Signed)
DAILY PROGRESS NOTE  Subjective:  Hypotension - complete heart block persists.  Hypotension is resolving with IV fluids. Mr. [name] is on the EP service, but had not been seen this morning. Called emergently to the bedside for acute upper GI bleed - see my separate acute intervention note.  Objective:  Temp:  [96.9 F (36.1 C)-98.4 F (36.9 C)] 96.9 F (36.1 C) (01/26 1100) Pulse Rate:  [32-185] 46 (01/26 1144) Resp:  [10-28] 12 (01/26 1144) BP: (83-112)/(33-50) 88/38 mmHg (01/26 1144) SpO2:  [38 %-100 %] 83 % (01/26 1144) Weight:  [189 lb 6 oz (85.9 kg)] 189 lb 6 oz (85.9 kg) (01/26 0500) Weight change: -7.1 oz (-0.2 kg)  Intake/Output from previous day: 01/25 0701 - 01/26 0700 In: 1251.7 [P.O.:600; I.V.:651.7] Out: -   Intake/Output from this shift:    Medications: Current Facility-Administered Medications  Medication Dose Route Frequency Provider Last Rate Last Dose  . 0.9 %  sodium chloride infusion  250 mL Intravenous PRN Perry Mount, PA-C      . 0.9 %  sodium chloride infusion   Intravenous Continuous Pixie Casino, MD      . acetaminophen (TYLENOL) tablet 650 mg  650 mg Oral Q4H PRN Perry Mount, PA-C   650 mg at 11/19/13 2147  . aspirin EC tablet 81 mg  81 mg Oral Daily Perry Mount, PA-C   81 mg at 11/21/13 1057  . Chlorhexidine Gluconate Cloth 2 % PADS 6 each  6 each Topical Q0600 Deboraha Sprang, MD   6 each at 11/21/13 [phone]  . DOPamine (INTROPIN) 800 mg in dextrose 5 % 250 mL infusion  2-10 mcg/kg/min Intravenous Titrated Brittainy Simmons, PA-C 16.3 mL/hr at 11/21/13 0904 10 mcg/kg/min at 11/21/13 0904  . famotidine (PEPCID) tablet 20 mg  20 mg Oral Daily Perry Mount, PA-C   20 mg at 11/21/13 1057  . heparin ADULT infusion 100 units/mL (25000 units/250 mL)  1,800 Units/hr Intravenous Continuous Narda Bonds, RPH 18 mL/hr at 11/21/13 0246 1,800 Units/hr at 11/21/13 0246  . lactulose (CHRONULAC) 10 GM/15ML solution 20 g  20 g Oral BID PRN Javier Docker, MD    20 g at 11/19/13 2148  . LORazepam (ATIVAN) tablet 2 mg  2 mg Oral BID PRN Perry Mount, PA-C   2 mg at 11/20/13 2142  . midodrine (PROAMATINE) tablet 10 mg  10 mg Oral Q T,Th,Sa-HD Perry Mount, PA-C   10 mg at 11/19/13 1203  . multivitamin (RENA-VIT) tablet 1 tablet  1 tablet Oral QHS Perry Mount, PA-C   1 tablet at 11/20/13 2141  . mupirocin ointment (BACTROBAN) 2 % 1 application  1 application Nasal BID Deboraha Sprang, MD   1 application at 96/04/54 1059  . nitroGLYCERIN (NITROSTAT) SL tablet 0.4 mg  0.4 mg Sublingual Q5 Min x 3 PRN Perry Mount, PA-C      . ondansetron [facility]) injection 4 mg  4 mg Intravenous Q6H PRN Perry Mount, PA-C      . oxyCODONE (Oxy IR/ROXICODONE) immediate release tablet 10 mg  10 mg Oral Q4H PRN Javier Docker, MD   10 mg at 11/21/13 1058  . pantoprazole (PROTONIX) 80 mg in sodium chloride 0.9 % 100 mL IVPB  80 mg Intravenous STAT Pixie Casino, MD      . pantoprazole (PROTONIX) 80 mg in sodium chloride 0.9 % 250 mL infusion  8 mg/hr Intravenous Continuous Pixie Casino, MD      . sevelamer carbonate (RENVELA) tablet  800 mg  800 mg Oral TID WC Perry Mount, PA-C   800 mg at 11/20/13 1818  . simvastatin (ZOCOR) tablet 40 mg  40 mg Oral QPM Perry Mount, PA-C   40 mg at 11/20/13 1818  . sodium chloride 0.9 % bolus 500 mL  500 mL Intravenous STAT Pixie Casino, MD      . sodium chloride 0.9 % injection 3 mL  3 mL Intravenous Q12H Perry Mount, PA-C   3 mL at 11/20/13 2200  . sodium chloride 0.9 % injection 3 mL  3 mL Intravenous PRN Perry Mount, PA-C      . zolpidem (AMBIEN) tablet 5 mg  5 mg Oral QHS PRN Perry Mount, PA-C   5 mg at 11/19/13 0304   Facility-Administered Medications Ordered in Other Encounters  Medication Dose Route Frequency Provider Last Rate Last Dose  . fentaNYL (SUBLIMAZE) injection   Intravenous PRN Art A Hoss, MD   25 mcg at 06/12/12 1049  . midazolam (VERSED) 5 MG/5ML injection   Intravenous PRN Art A Hoss, MD   1 mg at  06/12/12 1048    Physical Exam: General appearance: alert, mild distress and vomiting blood Neck: no carotid bruit and no JVD Lungs: diminished breath sounds bilaterally Heart: regular bradycardia Abdomen: soft, mildly distended, mild TTP Extremities: extremities normal, atraumatic, no cyanosis or edema Pulses: 2+ and symmetric Skin: Skin color, texture, turgor normal. No rashes or lesions Neurologic: Mental status: Alert, oriented, thought content appropriate .  Lab Results: Results for orders placed during the hospital encounter of 11/04/2013 (from the past 48 hour(s))  HEPARIN LEVEL (UNFRACTIONATED)     Status: None   Collection Time    11/19/13  2:05 PM      Result Value Range   Heparin Unfractionated 0.49  0.30 - 0.70 IU/mL   Comment:            IF HEPARIN RESULTS ARE BELOW     EXPECTED VALUES, AND PATIENT     DOSAGE HAS BEEN CONFIRMED,     SUGGEST FOLLOW UP TESTING     OF ANTITHROMBIN III LEVELS.  HEPARIN LEVEL (UNFRACTIONATED)     Status: None   Collection Time    11/20/13  3:35 AM      Result Value Range   Heparin Unfractionated 0.53  0.30 - 0.70 IU/mL   Comment:            IF HEPARIN RESULTS ARE BELOW     EXPECTED VALUES, AND PATIENT     DOSAGE HAS BEEN CONFIRMED,     SUGGEST FOLLOW UP TESTING     OF ANTITHROMBIN III LEVELS.  CBC     Status: Abnormal   Collection Time    11/20/13  3:35 AM      Result Value Range   WBC 7.9  4.0 - 10.5 K/uL   RBC 4.01 (*) 4.22 - 5.81 MIL/uL   Hemoglobin 11.8 (*) 13.0 - 17.0 g/dL   HCT 36.6 (*) 39.0 - 52.0 %   MCV 91.3  78.0 - 100.0 fL   MCH 29.4  26.0 - 34.0 pg   MCHC 32.2  30.0 - 36.0 g/dL   RDW 16.9 (*) 11.5 - 15.5 %   Platelets 200  150 - 400 K/uL  HEPARIN LEVEL (UNFRACTIONATED)     Status: None   Collection Time    11/21/13  6:05 AM      Result Value Range   Heparin Unfractionated 0.38  0.30 - 0.70  IU/mL   Comment:            IF HEPARIN RESULTS ARE BELOW     EXPECTED VALUES, AND PATIENT     DOSAGE HAS BEEN  CONFIRMED,     SUGGEST FOLLOW UP TESTING     OF ANTITHROMBIN III LEVELS.  CBC     Status: Abnormal   Collection Time    11/21/13  6:05 AM      Result Value Range   WBC 7.2  4.0 - 10.5 K/uL   Comment: WHITE COUNT CONFIRMED ON SMEAR   RBC 3.97 (*) 4.22 - 5.81 MIL/uL   Hemoglobin 11.9 (*) 13.0 - 17.0 g/dL   HCT 36.0 (*) 39.0 - 52.0 %   MCV 90.7  78.0 - 100.0 fL   MCH 30.0  26.0 - 34.0 pg   MCHC 33.1  30.0 - 36.0 g/dL   RDW 16.8 (*) 11.5 - 15.5 %   Platelets 219  150 - 400 K/uL  RENAL FUNCTION PANEL     Status: Abnormal   Collection Time    11/21/13  6:05 AM      Result Value Range   Sodium 138  137 - 147 mEq/L   Potassium 4.3  3.7 - 5.3 mEq/L   Chloride 89 (*) 96 - 112 mEq/L   CO2 23  19 - 32 mEq/L   Glucose, Bld 93  70 - 99 mg/dL   BUN 28 (*) 6 - 23 mg/dL   Creatinine, Ser 6.14 (*) 0.50 - 1.35 mg/dL   Calcium 10.3  8.4 - 10.5 mg/dL   Phosphorus 6.6 (*) 2.3 - 4.6 mg/dL   Albumin 3.5  3.5 - 5.2 g/dL   GFR calc non Af Amer 10 (*) >90 mL/min   GFR calc Af Amer 11 (*) >90 mL/min   Comment: (NOTE)     The eGFR has been calculated using the CKD EPI equation.     This calculation has not been validated in all clinical situations.     eGFR's persistently <90 mL/min signify possible Chronic Kidney     Disease.    Imaging: No results found.  Assessment:  Principal Problem:   Complete heart block Active Problems:   Mechanical complication of other vascular device, implant, and graft   Cardiogenic shock   Non-STEMI (non-ST elevated myocardial infarction)   Cardiomyopathy, ischemic   Acute upper GI bleeding   Plan:  1. Upper GI bleed - see prior note. GI consulted. Now on protonix gtts. Heparin discontinued. Checking STAT CBC, T+S, STAT INR. He is on chronic warfarin for APS.  May need to consider low dose Vit K.  Place NG tube. 2. Complete heart block - appears stable with HR in the upper 40's. BP improving with IV fluids. Not a candidate for pacer at this time until GI  bleeding issues have resolved. 3. Cardiogenic shock - EF appears preserved at 55%, no signs of CHF - ok for IV fluids. 4. ESRD on HD - tolerated HD yesterday. Appreciate nephrology input.  May need blood with HD if he continues to have losses. 5. NSTEMI - medical therapy at this time, too high risk for cath/PCI.  Time Spent Directly with Patient:  35 minutes  Length of Stay:  LOS: 3 days   Pixie Casino, MD, [facility] Attending Cardiologist [facility]  HILTY,Kenneth C 11/21/2013, 12:51 PM

## 2013-11-22 ENCOUNTER — Encounter (HOSPITAL_COMMUNITY): Admission: AD | Disposition: E | Payer: Self-pay | Source: Ambulatory Visit | Attending: Internal Medicine

## 2013-11-22 DIAGNOSIS — D689 Coagulation defect, unspecified: Secondary | ICD-10-CM

## 2013-11-22 LAB — CBC
HCT: 29 % — ABNORMAL LOW (ref 39.0–52.0)
Hemoglobin: 9.6 g/dL — ABNORMAL LOW (ref 13.0–17.0)
MCH: 29.4 pg (ref 26.0–34.0)
MCHC: 33.1 g/dL (ref 30.0–36.0)
MCV: 88.7 fL (ref 78.0–100.0)
Platelets: 269 10*3/uL (ref 150–400)
RBC: 3.27 MIL/uL — ABNORMAL LOW (ref 4.22–5.81)
RDW: 16 % — AB (ref 11.5–15.5)
WBC: 11.9 10*3/uL — AB (ref 4.0–10.5)

## 2013-11-22 LAB — PREPARE FRESH FROZEN PLASMA
Unit division: 0
Unit division: 0

## 2013-11-22 LAB — PROTIME-INR
INR: 1.36 (ref 0.00–1.49)
INR: 1.37 (ref 0.00–1.49)
PROTHROMBIN TIME: 16.4 s — AB (ref 11.6–15.2)
Prothrombin Time: 16.5 seconds — ABNORMAL HIGH (ref 11.6–15.2)

## 2013-11-22 LAB — LACTIC ACID, PLASMA: LACTIC ACID, VENOUS: 0.9 mmol/L (ref 0.5–2.2)

## 2013-11-22 SURGERY — EGD (ESOPHAGOGASTRODUODENOSCOPY)
Anesthesia: Moderate Sedation

## 2013-11-22 MED ORDER — NOREPINEPHRINE BITARTRATE 1 MG/ML IJ SOLN
2.0000 ug/min | INTRAVENOUS | Status: DC
  Administered 2013-11-22: 15 ug/min via INTRAVENOUS
  Administered 2013-11-23 (×2): 30 ug/min via INTRAVENOUS
  Filled 2013-11-22 (×4): qty 16

## 2013-11-22 NOTE — Progress Notes (Signed)
Hemodialysis-Attempted to set up machine in pt room for 2nd time today. Told no HD for pt. Please call if any changes.

## 2013-11-22 NOTE — Progress Notes (Signed)
INITIAL NUTRITION ASSESSMENT  DOCUMENTATION CODES Per approved criteria  -Not Applicable   INTERVENTION: 1.  Enteral nutrition; if enteral nutrition warranted after EGD today, recommend initiation of trophic feeds with Vital HP @ 10 mL/hr continuous.    NUTRITION DIAGNOSIS: Inadequate oral intake related to inability to eat as evidenced by GIB, vent.   Monitor:  1.  Enteral nutrition; initiation with tolerance.  Pt to meet >/=90% estimated needs with nutrition support.  2.  Wt/wt change; monitor trends 3.  Gastrointestinal; resolve of GI bleed and resume of enteral feeds  Reason for Assessment: vent  48 y.o. male  Admitting Dx: Complete heart block  ASSESSMENT: Pt admitted with complete heart block, also with hematemesis yesterday prompting intubation.  Pt scheduled for EGD today.   Patient is currently intubated on ventilator support.  MV: 10.3 L/min Temp (24hrs), Avg:99.5 F (37.5 C), Min:96.9 F (36.1 C), Max:100.6 F (38.1 C)  Propofol: none  Discussed with RN who reports blood work currently underway prior to doing EGD today.  Palliative has also been consulted.  Per MD note, pt's prognosis is poor.  May get HD today.  Height: Ht Readings from Last 1 Encounters:  November 26, 2013 5\' 7"  (1.702 m)    Weight: Wt Readings from Last 1 Encounters:  10/27/2013 195 lb 12.3 oz (88.8 kg)    Ideal Body Weight: 67.3 kg  % Ideal Body Weight: 132%  Wt Readings from Last 10 Encounters:  11/01/2013 195 lb 12.3 oz (88.8 kg)  11/13/2013 195 lb 12.3 oz (88.8 kg)  11/04/13 190 lb (86.183 kg)  10/03/13 190 lb 11.2 oz (86.5 kg)  10/03/13 190 lb 11.2 oz (86.5 kg)  09/16/13 190 lb 11.2 oz (86.5 kg)  08/15/13 190 lb 11.2 oz (86.5 kg)  01/19/13 191 lb 12.8 oz (87 kg)  01/19/13 191 lb 12.8 oz (87 kg)  01/07/13 191 lb 12.8 oz (87 kg)    Usual Body Weight: 190-195 lbs  % Usual Body Weight: 100%  BMI:  Body mass index is 30.65 kg/(m^2). Per Nephrology note, pt's dry wt is 187-190 lbs  (BMI <30 at dry wt).  Estimated Nutritional Needs: Kcal: 2118 Protein: 80-95g Fluid: >2.0 L/day  Skin: intact  Diet Order: NPO  EDUCATION NEEDS: -Education needs addressed   Intake/Output Summary (Last 24 hours) at 11/04/2013 0914 Last data filed at 10/29/2013 0700  Gross per 24 hour  Intake 3447.48 ml  Output      0 ml  Net 3447.48 ml    Last BM: PTA (4 days ago per pt report on admission)   Labs:   Recent Labs Lab 11-26-13 1938 11/19/13 0410 11/19/13 0930 11/21/13 0605  NA  --  134* 133* 138  K  --  5.0 4.5 4.3  CL  --  88* 87* 89*  CO2  --  22 24 23   BUN  --  35* 39* 28*  CREATININE  --  6.78* 7.46* 6.14*  CALCIUM  --  10.0 10.2 10.3  MG 2.2  --   --   --   PHOS  --   --   --  6.6*  GLUCOSE  --  91 112* 93    CBG (last 3)  No results found for this basename: GLUCAP,  in the last 72 hours  Scheduled Meds: . antiseptic oral rinse  15 mL Mouth Rinse QID  . chlorhexidine  15 mL Mouth Rinse BID  . Chlorhexidine Gluconate Cloth  6 each Topical Q0600  . midodrine  10 mg Oral Q T,Th,Sa-HD  . mupirocin ointment  1 application Nasal BID  . sevelamer carbonate  800 mg Oral TID WC  . sodium chloride  3 mL Intravenous Q12H    Continuous Infusions: . DOPamine 18 mcg/kg/min (11/25/2013 0630)  . fentaNYL infusion INTRAVENOUS 200 mcg/hr (11/13/2013 0630)  . midazolam (VERSED) infusion 4 mg/hr (11/25/2013 0445)  . norepinephrine (LEVOPHED) Adult infusion 20 mcg/min (11/25/2013 0445)  . pantoprozole (PROTONIX) infusion 8 mg/hr (11/04/2013 0425)    Past Medical History  Diagnosis Date  . Blindness   . Anxiety   . Hypertension   . Depression   . GERD (gastroesophageal reflux disease)   . Pneumonia   . Complication of anesthesia     "Wake up in the middle of procedure."  . Dysrhythmia   . Stroke 2003    Legally Blind  . Chronic kidney disease     Tues, Thurs, Sat  . History of blood transfusion   . Myocardial infarction 2000    no cardiologist-  . Cardiomyopathy,  ischemic 11/25/2013  . Complete heart block 11/14/2013  . Non-STEMI (non-ST elevated myocardial infarction) 11/18/2013    Past Surgical History  Procedure Laterality Date  . Arteriovenous graft placement  11-07-1998    right upper extremity   . Arteriovenous graft placement  10-18-1998    right upper extremity  . Cardiac catheterization      Stents  . Arteriovenous graft placement      Right arm x 2  . Artreriovenous      Graft Right leg  . 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  . Peritoneal catheter insertion    . Peritoneal catheter removal    . Colon resection      due to peritonitis  . Nephrostomy      left  . Sp declot avgg  04/19/12    Left Thigh  . Fistulogram Left 01/19/2013    Procedure: FISTULOGRAM;  Surgeon: Fransisco HertzBrian L Chen, MD;  [facility];  Service: Vascular;  Laterality: Left;  SHUNTOGRAM  . Angioplasty Left 01/19/2013    Procedure: Balloon Angioplasty;  Surgeon: Fransisco HertzBrian L Chen, MD;  [facility];  Service: Vascular;  Laterality: Left;  Left thigh fistulogram and balloon angioplasty    Loyce DysKacie Lakita Sahlin, MS RD LDN Clinical Inpatient Dietitian Pager: [phone] Weekend/After hours pager: [phone]

## 2013-11-22 NOTE — Progress Notes (Signed)
          Daily Rounding Note  11/12/2013, 8:32 AM  LOS: 4 days   SUBJECTIVE:       No further bleeding, no emesis, no stools.  Remains intubatecd  OBJECTIVE:         Vital signs in last 24 hours:    Temp:  [96.9 F (36.1 C)-100.6 F (38.1 C)] 100.6 F (38.1 C) (01/27 0400) Pulse Rate:  [43-96] 61 (01/27 0700) Resp:  [10-27] 20 (01/27 0700) BP: (83-153)/(33-74) 153/53 mmHg (01/27 0325) SpO2:  [79 %-100 %] 100 % (01/27 0700) Arterial Line BP: (121-145)/(43-49) 134/47 mmHg (01/27 0700) FiO2 (%):  [40 %-100 %] 40 % (01/27 0826) Weight:  [88.8 kg (195 lb 12.3 oz)] 88.8 kg (195 lb 12.3 oz) (01/27 0500) Last BM Date:  (Pt stated "4 days ago") Did not reexamine pt who remains intubated.   Intake/Output from previous day: 01/26 0701 - 01/27 0700 In: 3516.1 [I.V.:2863.1; Blood:603; IV Piggyback:50] Out: -   Intake/Output this shift:    Lab Results:  Recent Labs  11/21/13 1318 11/21/13 2219 11/11/2013 0430  WBC 11.2* 10.6* 11.9*  HGB 11.1* 9.2* 9.6*  HCT 34.7* 27.9* 29.0*  PLT 222 264 269   BMET  Recent Labs  11/19/13 0930 11/21/13 0605  NA 133* 138  K 4.5 4.3  CL 87* 89*  CO2 24 23  GLUCOSE 112* 93  BUN 39* 28*  CREATININE 7.46* 6.14*  CALCIUM 10.2 10.3   LFT  Recent Labs  11/21/13 0605  ALBUMIN 3.5   PT/INR  Recent Labs  11/21/13 1424  LABPROT 29.9*  INR 2.98*    Studies/Results: Dg Chest Port 1 View  11/21/2013   CLINICAL DATA:  Intubation  EXAM: PORTABLE CHEST - 1 VIEW  COMPARISON:  04/19/2012  FINDINGS: Endotracheal tube 2 cm above the carina. Cardiomegaly evident with vascular and interstitial prominence, suspect early edema pattern. Minimal basilar atelectasis, worse in the left lower lobe. Chronic left lower lobe retrocardiac scarring as well when compared to the prior study. No enlarging effusion or pneumothorax. Postop changes from thyroidectomy. Left jugular region peripheral IV noted.   IMPRESSION: Endotracheal tube 2 cm above the carina  Mild interstitial edema pattern, basilar atelectasis and chronic left lower lobe scarring  No pneumothorax   Electronically Signed   By: Daryll Brod M.D.   On: 11/21/2013 18:47    ASSESMENT:   * Acute hematemesis x one. Heparin discontinued. PPI drip initiated.  Take 8 Excedrin per day so high liklihood this is an NSAID ulcer.  On PPI drip * Coagulopathy. Pt has not gotten Warfarin. LFTs normal except for Alk phos of 120, no hx of liver disease.  Given vitamin k  10 mg IV and 2 FFP last night +  ABL anemia.  hgb drop of 1.5 grams since yesterday.  * CHB and non STEMI.  * ESRD. All vascular access exhausted and dializes via catheter in his leg.     PLAN   *  EGD today.  At noon  Azucena Freed  11/13/2013, 8:32 AM Pager: [phone]  GI ATTENDING  Interval history and laboratories reviewed. Patient seen and examined. Agree with above. No active GI bleeding. The decision has been made to make the patient DO NOT RESUSCITATE. Endoscopy not desired by the family. This is quite reasonable. Will sign off.  Docia Chuck. Geri Seminole., M.D. [facility] Division of Gastroenterology

## 2013-11-22 NOTE — Progress Notes (Signed)
PULMONARY / CRITICAL CARE MEDICINE  Name: [name] MRN: [id number] DOB: 06/11/1966    ADMISSION DATE:  11/07/2013 CONSULTATION DATE:  11/21/2013  REFERRING MD :  Cardiology PRIMARY SERVICE:  Cardiology  CHIEF COMPLAINT:  Acute upper GI hemorrhage  BRIEF PATIENT DESCRIPTION: 48 yo with past medical history of  Ischemic cardiomyopathy and ESRD on HD [facility] to cardiology service due to complete heart block associated with bradycardia and hypotension.  On 1/26 developed acute upper GI hemorrhage / hematemesis.  SIGNIFICANT EVENTS / STUDIES:  1/23  [facility] in complete heart block 1/24  TTE >>> Preserved EF, diastolic dysfunction  1/26  Hematemesis, PCCM consulted, intubated for airway protection  LINES / TUBES: OETT 1/16 >>>  CULTURES: 1/23  MRSA PCR >>> POSITIVE  ANTIBIOTICS: none  INTERVAL HISTORY: Patient intubated and sedated, not in any distress  VITAL SIGNS: Temp:  [96.9 F (36.1 C)-100.1 F (37.8 C)] 100.1 F (37.8 C) (01/27 0000) Pulse Rate:  [43-96] 58 (01/27 0500) Resp:  [10-27] 19 (01/27 0500) BP: (83-153)/(33-74) 153/53 mmHg (01/27 0325) SpO2:  [79 %-100 %] 100 % (01/27 0500) Arterial Line BP: (121-145)/(43-49) 141/46 mmHg (01/27 0500) FiO2 (%):  [40 %-100 %] 40 % (01/27 0500) Weight:  [195 lb 12.3 oz (88.8 kg)] 195 lb 12.3 oz (88.8 kg) (01/27 0500)  HEMODYNAMICS:   VENTILATOR SETTINGS: Vent Mode:  [-] PRVC FiO2 (%):  [40 %-100 %] 40 % Set Rate:  [16 bmp] 16 bmp Vt Set:  [520 mL-530 mL] 520 mL PEEP:  [5 cmH20] 5 cmH20 Plateau Pressure:  [21 cmH20] 21 cmH20  INTAKE / OUTPUT: Intake/Output     01/26 0701 - 01/27 0700   I.V. (mL/kg) 2663.7 (30)   Blood 603   IV Piggyback 50   Total Intake(mL/kg) 3316.7 (37.4)   Net +3316.7        PHYSICAL EXAMINATION: General:  Appears acutely ill, mechanically ventilated, synchronous Neuro:  Encephalopathic, nonfocal HEENT:  PERRL, OETT Cardiovascular:  Bradycardic,  regular Lungs:  Bilateral air entry, coarse rhonchi Abdomen:  Soft, nontender, bowel sounds diminished Musculoskeletal:  Moves all extremities, L thigh access in place with good thrill and bruit Skin:  Intact  LABS: CBC  Recent Labs Lab 11/21/13 1318 11/21/13 2219 11/16/2013 0430  WBC 11.2* 10.6* 11.9*  HGB 11.1* 9.2* 9.6*  HCT 34.7* 27.9* 29.0*  PLT 222 264 269   Coag's  Recent Labs Lab 11/01/2013 1938 11/19/13 0410 11/21/13 1424  INR 1.84* 2.08* 2.98*   BMET  Recent Labs Lab 11/19/13 0410 11/19/13 0930 11/21/13 0605  NA 134* 133* 138  K 5.0 4.5 4.3  CL 88* 87* 89*  CO2 22 24 23   BUN 35* 39* 28*  CREATININE 6.78* 7.46* 6.14*  GLUCOSE 91 112* 93   Electrolytes  Recent Labs Lab 11/16/2013 1938 11/19/13 0410 11/19/13 0930 11/21/13 0605  CALCIUM  --  10.0 10.2 10.3  MG 2.2  --   --   --   PHOS  --   --   --  6.6*   Sepsis Markers  Recent Labs Lab 11/21/13 2220 11/10/2013 0430  LATICACIDVEN 1.0 0.9   ABG  Recent Labs Lab 11/21/13 1630 11/21/13 1712 11/21/13 1729  PHART 7.395 7.333* 7.463*  PCO2ART 31.9* 53.8* 41.1  PO2ART 131.0* 34.0* 370.0*   Liver Enzymes  Recent Labs Lab 11/19/13 0410 11/21/13 0605  AST 15  --   ALT 10  --   ALKPHOS 120*  --  BILITOT 0.4  --   ALBUMIN 3.5 3.5   Cardiac Enzymes  Recent Labs Lab 11/07/2013 1938 11/19/13 0137 11/19/13 0410  TROPONINI 0.64* 0.61* 0.52*  PROBNP 7073.0*  --   --    Glucose No results found for this basename: GLUCAP,  in the last 168 hours  CXR:  1/26 >>>thyroidectomy, ETT in place, chronic changes  ASSESSMENT / PLAN:  PULMONARY A:   Acute respiratory failure in setting of possible aspiration Aspiration pneumonitis vs pneumonia P:   - Goal SpO2>92, pH>7.30. - Full mechanical support until cardiology communicates with family regarding plan of care. - Daily SBT. - Ventilator bundle. - Trend CXR.  CARDIOVASCULAR  A:  Complete heart block / bradycardia NSTEMI? Shock  secondary to bradycardia / acute blood and volume loss CAD / non-ischemic cardiomyopathy Chronic diastolic heart failure Poor IV acces ( IJ cannot be visualized with [place] bilaterally / L [place] difficulty threading / bilateral femoral grafts? ) P:  - Cardiology following, needs pacer but poor candidate. - Goal MAP>65, HR>60. - Trend troponin. - Levophed to goal MAP. - Dopamine to goal HR. - Request IR to place IV access pending plan of care discussion between family and cardiology today. - Hold ASA. - Zocor when able. - Midodrine when able to get GI access.  RENAL A:   ESRD on HD Tu/Th/Sa.  End stage access, on pressors, unlikely to tolerate intermittent HD, will need CVVH but unable to get any access at this point. P:   - Renal following. - Trend BMP. - Goals of care meeting with family today.  GASTROINTESTINAL A:   Upper GI hemorrhage P:   - GI following. - EGD planned for today. - NPO. - Protonix gtt.  HEMATOLOGIC A:   Coagulopathy secondary to Warfarin Suspect acute blood loss anemia P:  - Trend CBC q24h. - Hold Heparin / Coumadin. - SCD.  INFECTIOUS A:   No active issues  P:   - No intervention required.  ENDOCRINE  A:   No active issues P:   - No intervention required.  NEUROLOGIC A:  Acute encephalopathy P:   - Goal RASS 0 to -1. - Daily WUA. - Fentanyl / Versed gtt.  Genella Mech, PGY-3 11/25/2013, 6:52 AM  Today's Summary: Hg stable with protonix drip and plans for EGD today.  BMET today after goals of care meeting.  Goals of care meeting with family and cardiology today, pending that will need to see what therapies are available.  CC time 35 min.  Patient seen and examined, agree with above note.  I dictated the care and orders written for this patient under my direction.  Alyson Reedy, MD [phone]

## 2013-11-22 NOTE — Progress Notes (Addendum)
SUBJECTIVE: Intubated and sedated after acute episode of hematemesis yesterday  Currently on dopamine and Levophed  CURRENT MEDICATIONS: . antiseptic oral rinse  15 mL Mouth Rinse QID  . chlorhexidine  15 mL Mouth Rinse BID  . Chlorhexidine Gluconate Cloth  6 each Topical Q0600  . midodrine  10 mg Oral Q T,Th,Sa-HD  . mupirocin ointment  1 application Nasal BID  . sevelamer carbonate  800 mg Oral TID WC  . simvastatin  40 mg Oral QPM  . sodium chloride  3 mL Intravenous Q12H   . DOPamine 15 mcg/kg/min (11/16/2013 16100621)  . fentaNYL infusion INTRAVENOUS 150 mcg/hr (10/28/2013 0425)  . midazolam (VERSED) infusion 4 mg/hr (11/08/2013 0445)  . norepinephrine (LEVOPHED) Adult infusion 20 mcg/min (11/06/2013 0445)  . pantoprozole (PROTONIX) infusion 8 mg/hr (11/17/2013 0425)    OBJECTIVE: Physical Exam: Filed Vitals:   11/21/2013 0400 11/13/2013 0415 11/09/2013 0430 11/09/2013 0500  BP:      Pulse: 58 57 57 58  Temp:      TempSrc:      Resp: 18 19 27 19   Height:      Weight:    195 lb 12.3 oz (88.8 kg)  SpO2: 100% 100% 100% 100%    Intake/Output Summary (Last 24 hours) at 11/02/2013 0651 Last data filed at 11/19/2013 0500  Gross per 24 hour  Intake 3488.23 ml  Output      0 ml  Net 3488.23 ml    Telemetry reveals complete heart block with narrow complex ventricular escape in the 50's  GEN- The patient is ill appearing, sedated on the vent Head- normocephalic, atraumatic Oropharynx- ETT Neck- supple  Lungs- Clear to ausculation bilaterally  Heart- Regular rate and rhythm  GI- soft, NT, ND, + BS Extremities- no clubbing, cyanosis, + dependant edema Skin- no rash or lesion Psych- sedated Neuro- sedated  LABS: Basic Metabolic Panel:  Recent Labs  96/01/5400/24/15 0930 11/21/13 0605  NA 133* 138  K 4.5 4.3  CL 87* 89*  CO2 24 23  GLUCOSE 112* 93  BUN 39* 28*  CREATININE 7.46* 6.14*  CALCIUM 10.2 10.3  PHOS  --  6.6*   Liver Function Tests:  Recent Labs  11/21/13 0605    ALBUMIN 3.5   CBC:  Recent Labs  11/21/13 2219 11/18/2013 0430  WBC 10.6* 11.9*  HGB 9.2* 9.6*  HCT 27.9* 29.0*  MCV 88.9 88.7  PLT 264 269   RADIOLOGY: Dg Chest Port 1 View 11/21/2013   CLINICAL DATA:  Intubation  EXAM: PORTABLE CHEST - 1 VIEW  COMPARISON:  04/19/2012  FINDINGS: Endotracheal tube 2 cm above the carina. Cardiomegaly evident with vascular and interstitial prominence, suspect early edema pattern. Minimal basilar atelectasis, worse in the left lower lobe. Chronic left lower lobe retrocardiac scarring as well when compared to the prior study. No enlarging effusion or pneumothorax. Postop changes from thyroidectomy. Left jugular region peripheral IV noted.  IMPRESSION: Endotracheal tube 2 cm above the carina  Mild interstitial edema pattern, basilar atelectasis and chronic left lower lobe scarring  No pneumothorax   Electronically Signed   By: Ruel Favorsrevor  Shick M.D.   On: 11/21/2013 18:47   ASSESSMENT AND PLAN:  Principal Problem:   Complete heart block Active Problems:   Mechanical complication of other vascular device, implant, and graft   Cardiogenic shock   Non-STEMI (non-ST elevated myocardial infarction)   Cardiomyopathy, ischemic   Acute upper GI bleeding   Acute respiratory failure  1. Complete heart block Has not  resolved Not a candidate for temporary or permanent transvenous pacemaker implant.  Presently too sick for epicardial pacing system.  Continue dopamine for now.  Transcutaneous pacing would not be a reasonable option.  2. GI bleed Appears stable presently  3. 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 acure respiratory failure On vent  Per PCCM  5. Hemorraghic shock Remains on pressors S/p Vit K and FFP yesterday  6. ESRD Due for HD today  7. NSTEMI Heparin discontinued Not presently a candidate for cath.  Prognosis is very poor.  I will discuss  this with his family.  I think that palliative measures are probably most appropriate at this point.   The patient is critically ill with multiple organ systems failure and requires high complexity decision making for assessment and support, frequent evaluation and titration of therapies, application of advanced monitoring technologies and extensive interpretation of multiple databases.   Total CCT spent directly with the patient today is 30 minutes.   Addendum:  I have spoken at length with the patients mother and two sisters today.  They understand that his prognosis is very poor.  They state that his wishes are for DNR.  We will continue to dialyze and provide current level of pressor/ airway support.  They are interested in palliative discussions and therefore palliative care consultation is ordered. I have spoken with GI and we will defer endoscopy for now until prognosis is more clear.  I have also spoken with Dr Arlean Hopping who plans to dialyze today.   Given AV block in this young patient with multiple risks factors for bacteremia, I will order blood cultures to evaluate for bacteremia.  Echo is reviewed and does not reveal obvious endocarditis.  I think he is too sick for TEE presently. He is not a candidate for PPM implant as I have stated before.  Hillis Range MD

## 2013-11-22 NOTE — Progress Notes (Signed)
Family (Sister and Mother) has refused HD, Endoscopy and blood cultures.  Dialysis department notified and HD MD was paged.  Dr. Marina GoodellPerry informed and aware regarding not performing Endoscopy.

## 2013-11-22 NOTE — Progress Notes (Addendum)
  [facility] Progress Note   Subjective: Acute GIB yest with hematemesis, low BP's, on vent now. Got 2u prbc, Hb 9's today. Unable to get central access, CCM attempted bilat upper access and no vein on US of R femoral region.  On pressors via peripheral IV's.   Filed Vitals:   11/03/2013 0600 11/12/2013 0700 11/17/2013 0800 11/02/2013 0900  BP:      Pulse: 57 61 59 58  Temp:   99.6 F (37.6 C)   TempSrc:   Oral   Resp: 18 20 19 23   Height:      Weight:      SpO2: 100% 100% 100% 100%  On vent, sedated Facial edema Chest is clear bilat RRR 2/6 M, no rub or gallop Abd obese, nondistended No LE or UE edema L thigh AVG is patent  Dialysis: [facility] ([facility]) 4.5h  86.5kg  2K/2.25 Bath  L thigh AVG  Heparin 8000 Hect 4    EPO 1600   Venofer 50mg /wk  Assessment 1. Complete heart block, persistent- no access for transvenous PM and too unstable for epicardial lead. On dopamine 2. Cardiogenic shock 3. Acute UGIB, s/p 2u prbc yest 4. Acute resp failure 5. ESRD on hemodialysis, end-stage access 6. NSTEMI 7. 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  Richard Moselleob Marbeth Smedley MD pager [phone]    cell [phone] 11/06/2013, 11:47 AM   Recent Labs Lab 11/19/13 0410 11/19/13 0930 11/21/13 0605  NA 134* 133* 138  K 5.0 4.5 4.3  CL 88* 87* 89*  CO2 22 24 23   GLUCOSE 91 112* 93  BUN 35* 39* 28*  CREATININE 6.78* 7.46* 6.14*  CALCIUM 10.0 10.2 10.3  PHOS  --   --  6.6*    Recent Labs Lab 11/19/13 0410 11/21/13 0605  AST 15  --   ALT 10  --   ALKPHOS 120*  --   BILITOT 0.4  --   PROT 8.2  --   ALBUMIN 3.5 3.5    Recent Labs Lab 11/21/13 1318 11/21/13 2219 10/30/2013 0430  WBC 11.2* 10.6* 11.9*  HGB 11.1* 9.2* 9.6*  HCT 34.7* 27.9* 29.0*  MCV 94.3 88.9 88.7  PLT 222 264 269   . antiseptic oral rinse  15 mL Mouth Rinse QID  . chlorhexidine   15 mL Mouth Rinse BID  . Chlorhexidine Gluconate Cloth  6 each Topical Q0600  . midodrine  10 mg Oral Q T,Th,Sa-HD  . mupirocin ointment  1 application Nasal BID  . sevelamer carbonate  800 mg Oral TID WC  . sodium chloride  3 mL Intravenous Q12H   . DOPamine 18 mcg/kg/min (11/01/2013 0630)  . fentaNYL infusion INTRAVENOUS 200 mcg/hr (10/30/2013 0630)  . midazolam (VERSED) infusion 4 mg/hr (11/21/2013 0445)  . norepinephrine (LEVOPHED) Adult infusion 20 mcg/min (10/27/2013 0445)  . pantoprozole (PROTONIX) infusion 8 mg/hr (10/29/2013 0425)   sodium chloride, acetaminophen, ondansetron (ZOFRAN) IV, sodium chloride

## 2013-11-23 LAB — RENAL FUNCTION PANEL
ALBUMIN: 3 g/dL — AB (ref 3.5–5.2)
BUN: 58 mg/dL — AB (ref 6–23)
CALCIUM: 9.9 mg/dL (ref 8.4–10.5)
CO2: 20 mEq/L (ref 19–32)
Chloride: 92 mEq/L — ABNORMAL LOW (ref 96–112)
Creatinine, Ser: 9.15 mg/dL — ABNORMAL HIGH (ref 0.50–1.35)
GFR calc Af Amer: 7 mL/min — ABNORMAL LOW (ref >90)
GFR calc non Af Amer: 6 mL/min — ABNORMAL LOW (ref >90)
Glucose, Bld: 88 mg/dL (ref 70–99)
PHOSPHORUS: 5.6 mg/dL — AB (ref 2.3–4.6)
POTASSIUM: 4.3 meq/L (ref 3.7–5.3)
Sodium: 134 mEq/L — ABNORMAL LOW (ref 137–147)

## 2013-11-23 LAB — CBC
HCT: 28.3 % — ABNORMAL LOW (ref 39.0–52.0)
Hemoglobin: 9.5 g/dL — ABNORMAL LOW (ref 13.0–17.0)
MCH: 29.5 pg (ref 26.0–34.0)
MCHC: 33.6 g/dL (ref 30.0–36.0)
MCV: 87.9 fL (ref 78.0–100.0)
Platelets: 270 10*3/uL (ref 150–400)
RBC: 3.22 MIL/uL — ABNORMAL LOW (ref 4.22–5.81)
RDW: 16 % — AB (ref 11.5–15.5)
WBC: 12.6 10*3/uL — ABNORMAL HIGH (ref 4.0–10.5)

## 2013-11-23 MED ORDER — MORPHINE SULFATE 10 MG/ML IJ SOLN
10.0000 mg/h | INTRAMUSCULAR | Status: DC
  Administered 2013-11-23: 10 mg/h via INTRAVENOUS
  Filled 2013-11-23: qty 10

## 2013-11-23 MED ORDER — MORPHINE BOLUS VIA INFUSION
5.0000 mg | INTRAVENOUS | Status: DC | PRN
  Filled 2013-11-23: qty 20

## 2013-11-27 NOTE — Progress Notes (Signed)
Wasted 60 mg morphine and 20 mg versed in sink witnessed by two RN'S.

## 2013-11-27 NOTE — Progress Notes (Signed)
Patient Name: [name]      SUBJECTIVE  Events noted  Plan is to withdraw care and no further HD  Past Medical History  Diagnosis Date  . Blindness   . Anxiety   . Hypertension   . Depression   . GERD (gastroesophageal reflux disease)   . Pneumonia   . Complication of anesthesia     "Wake up in the middle of procedure."  . Dysrhythmia   . Stroke 2003    Legally Blind  . Chronic kidney disease     Tues, Thurs, Sat  . History of blood transfusion   . Myocardial infarction 2000    no cardiologist-  . Cardiomyopathy, ischemic Jul 18, 2014  . Complete heart block Jul 18, 2014  . Non-STEMI (non-ST elevated myocardial infarction) Jul 18, 2014    Scheduled Meds:  Scheduled Meds: . antiseptic oral rinse  15 mL Mouth Rinse QID  . chlorhexidine  15 mL Mouth Rinse BID  . Chlorhexidine Gluconate Cloth  6 each Topical Q0600  . mupirocin ointment  1 application Nasal BID  . sodium chloride  3 mL Intravenous Q12H   Continuous Infusions: . DOPamine 20 mcg/kg/min (11/12/2013 0200)  . fentaNYL infusion INTRAVENOUS 200 mcg/hr (11/04/2013 2304)  . midazolam (VERSED) infusion 4 mg/hr (11/12/2013 1900)  . norepinephrine (LEVOPHED) Adult infusion 30 mcg/min (11/07/2013 0530)  . pantoprozole (PROTONIX) infusion 8 mg/hr (10/29/2013 0300)    PHYSICAL EXAM Filed Vitals:   11/26/2013 0400 10/30/2013 0500 11/05/2013 0600 11/11/2013 0700  BP:      Pulse: 56 58 52 52  Temp: 99.9 F (37.7 C)     TempSrc: Oral     Resp: 19 21 19 16   Height:      Weight:  197 lb 12 oz (89.7 kg)    SpO2: 100% 100% 100% 100%    Intubated sedated   TELEMETRY: Reviewed telemetry pt in  Some improved conduction    Intake/Output Summary (Last 24 hours) at 11/17/2013 0805 Last data filed at 11/05/2013 0700  Gross per 24 hour  Intake 2599.12 ml  Output      0 ml  Net 2599.12 ml    LABS: Basic Metabolic Panel:  Recent Labs Lab 26-Oct-2014 1938  11/19/13 0410 11/19/13 0930 11/21/13 0605 11/12/2013 0500  NA  --    --  134* 133* 138 134*  K  --   --  5.0 4.5 4.3 4.3  CL  --   --  88* 87* 89* 92*  CO2  --   --  22 24 23 20   GLUCOSE  --   --  91 112* 93 88  BUN  --   --  35* 39* 28* 58*  CREATININE  --   --  6.78* 7.46* 6.14* 9.15*  CALCIUM  --   < > 10.0 10.2 10.3 9.9  MG 2.2  --   --   --   --   --   PHOS  --   --   --   --  6.6* 5.6*  < > = values in this interval not displayed. Cardiac Enzymes: No results found for this basename: CKTOTAL, CKMB, CKMBINDEX, TROPONINI,  in the last 72 hours CBC:  Recent Labs Lab 11/19/13 0410 11/20/13 0335 11/21/13 0605 11/21/13 1318 11/21/13 2219 11/03/2013 0430 11/11/2013 0500  WBC 6.2 7.9 7.2 11.2* 10.6* 11.9* 12.6*  HGB 11.6* 11.8* 11.9* 11.1* 9.2* 9.6* 9.5*  HCT 35.0* 36.6* 36.0* 34.7* 27.9* 29.0* 28.3*  MCV 90.7  91.3 90.7 94.3 88.9 88.7 87.9  PLT 180 200 219 222 264 269 270   PROTIME:  Recent Labs  11/21/13 1424 11/17/2013 0840 10/28/2013 1049  LABPROT 29.9* 16.4* 16.5*  INR 2.98* 1.36 1.37   Liver Function Tests:  Recent Labs  11/21/13 0605 10/28/2013 0500  ALBUMIN 3.5 3.0*   No results found for this basename: LIPASE, AMYLASE,  in the last 72 hours BNP: BNP (last 3 results)  Recent Labs  December 04, 2013 1938  PROBNP 7073.0*   D-Dimer:    ASSESSMENT AND PLAN:  Principal Problem:   Complete heart block Active Problems:   Non-STEMI (non-ST elevated myocardial infarction)   Mechanical complication of other vascular device, implant, and graft   Cardiogenic shock   Cardiomyopathy, ischemic   Acute upper GI bleeding   Acute respiratory failure  Improved  On our service  Nothing to add Will talk with family when they arrive  Signed, Sherryl Manges MD  10/27/2013

## 2013-11-27 NOTE — Progress Notes (Signed)
PULMONARY / CRITICAL CARE MEDICINE  Name: [name] MRN: [id number] DOB: 10/29/1965    ADMISSION DATE:  11/10/2013 CONSULTATION DATE:  11/21/2013  REFERRING MD :  Cardiology PRIMARY SERVICE:  Cardiology  CHIEF COMPLAINT:  Acute upper GI hemorrhage  BRIEF PATIENT DESCRIPTION: 48 yo with past medical history of  Ischemic cardiomyopathy and ESRD on HD [facility] to cardiology service due to complete heart block associated with bradycardia and hypotension.  On 1/26 developed acute upper GI hemorrhage / hematemesis.  SIGNIFICANT EVENTS / STUDIES:  1/23  [facility] block 1/24  TTE >>> Preserved EF, diastolic dysfunction  1/26  Hematemesis, PCCM consulted, intubated for airway protection 1/27 Goals of care talk, no EGD, no HD, DNR  LINES / TUBES: OETT 1/16 >>>  CULTURES: 1/23  MRSA PCR >>> POSITIVE  ANTIBIOTICS: none  INTERVAL HISTORY: Patient intubated and sedated, not in any distress  VITAL SIGNS: Temp:  [99.6 F (37.6 C)-100.9 F (38.3 C)] 100.6 F (38.1 C) (01/28 0000) Pulse Rate:  [52-166] 52 (01/28 0600) Resp:  [18-23] 19 (01/28 0600) SpO2:  [99 %-100 %] 100 % (01/28 0600) Arterial Line BP: (93-134)/(41-61) 104/42 mmHg (01/28 0600) FiO2 (%):  [40 %] 40 % (01/28 0600) Weight:  [197 lb 12 oz (89.7 kg)] 197 lb 12 oz (89.7 kg) (01/28 0500)  HEMODYNAMICS:   VENTILATOR SETTINGS: Vent Mode:  [-] PRVC FiO2 (%):  [40 %] 40 % Set Rate:  [16 bmp] 16 bmp Vt Set:  [520 mL] 520 mL PEEP:  [5 cmH20] 5 cmH20 Plateau Pressure:  [11 cmH20-24 cmH20] 21 cmH20  INTAKE / OUTPUT: Intake/Output     01/27 0701 - 01/28 0700   I.V. (mL/kg) 2586.5 (28.8)   Total Intake(mL/kg) 2586.5 (28.8)   Net +2586.5        PHYSICAL EXAMINATION: General:  Appears acutely ill, mechanically ventilated, synchronous Neuro:  Encephalopathic, nonfocal HEENT:  PERRL, OETT Cardiovascular:  Bradycardic, regular Lungs:  Bilateral air entry, coarse  rhonchi Abdomen:  Soft, nontender, bowel sounds diminished Musculoskeletal:  Moves all extremities, L thigh access in place with good thrill Skin:  Intact  LABS: CBC  Recent Labs Lab 11/21/13 2219 11/12/2013 0430 09/23/2014 0500  WBC 10.6* 11.9* 12.6*  HGB 9.2* 9.6* 9.5*  HCT 27.9* 29.0* 28.3*  PLT 264 269 270   Coag's  Recent Labs Lab 11/21/13 1424 11/03/2013 0840 10/29/2013 1049  INR 2.98* 1.36 1.37   BMET  Recent Labs Lab 11/19/13 0930 11/21/13 0605 09/23/2014 0500  NA 133* 138 134*  K 4.5 4.3 4.3  CL 87* 89* 92*  CO2 24 23 20   BUN 39* 28* 58*  CREATININE 7.46* 6.14* 9.15*  GLUCOSE 112* 93 88   Electrolytes  Recent Labs Lab 11/19/2013 1938  11/19/13 0930 11/21/13 0605 09/23/2014 0500  CALCIUM  --   < > 10.2 10.3 9.9  MG 2.2  --   --   --   --   PHOS  --   --   --  6.6* 5.6*  < > = values in this interval not displayed. Sepsis Markers  Recent Labs Lab 11/21/13 2220 11/13/2013 0430  LATICACIDVEN 1.0 0.9   ABG  Recent Labs Lab 11/21/13 1630 11/21/13 1712 11/21/13 1729  PHART 7.395 7.333* 7.463*  PCO2ART 31.9* 53.8* 41.1  PO2ART 131.0* 34.0* 370.0*   Liver Enzymes  Recent Labs Lab 11/19/13 0410 11/21/13 0605 09/23/2014 0500  AST 15  --   --  ALT 10  --   --   ALKPHOS 120*  --   --   BILITOT 0.4  --   --   ALBUMIN 3.5 3.5 3.0*   Cardiac Enzymes  Recent Labs Lab 11/03/2013 1938 11/19/13 0137 11/19/13 0410  TROPONINI 0.64* 0.61* 0.52*  PROBNP 7073.0*  --   --    Glucose No results found for this basename: GLUCAP,  in the last 168 hours  CXR:  No new  ASSESSMENT / PLAN:  PULMONARY A:   Acute respiratory failure in setting of possible aspiration Aspiration pneumonitis vs pneumonia P:   - Goal SpO2>92, pH>7.30. - Full mechanical support for now, no new interventions - Hold daily SBT - Ventilator bundle.  CARDIOVASCULAR  A:  Complete heart block / bradycardia NSTEMI? Shock secondary to bradycardia / acute blood and volume  loss CAD / non-ischemic cardiomyopathy Chronic diastolic heart failure Poor IV acces ( IJ cannot be visualized with [place] bilaterally / L [place] difficulty threading / bilateral femoral grafts? ) P:  - Cardiology following, needs pacer but poor candidate. - Goal MAP>65, HR>60. - Levophed to goal MAP. - Dopamine to goal HR. - Hold ASA. - Zocor when able. - Midodrine when able to get GI access.  RENAL A:   ESRD on HD Tu/Th/Sa.  End stage access, on pressors, unlikely to tolerate intermittent HD, will need CVVH but unable to get any access at this point. P:   - Renal following. -No HD - Trend BMP.  GASTROINTESTINAL A:   Upper GI hemorrhage, Hg stable. P:   - GI following. -No EGD - NPO. - Protonix gtt.  HEMATOLOGIC A:   Coagulopathy secondary to Warfarin Suspect acute blood loss anemia P:  - Trend CBC q24h. - Hold Heparin / Coumadin. - SCD.  INFECTIOUS A:   No active issues  P:   - No intervention required.  ENDOCRINE  A:   No active issues P:   - No intervention required.  NEUROLOGIC A:  Acute encephalopathy P:   - Goal RASS 0 to -1. - Daily WUA. - Fentanyl / Versed gtt.  Genella Mech, PGY-3 6:54 AM,11/05/2013  Family arrived, ready for withdrawal.  Question answered.  Will place withdrawal orders in.  CC time 35 min.  Alyson Reedy, M.D. [facility] Pulmonary/Critical Care Medicine. Pager: [phone]. After hours pager: [phone].

## 2013-11-27 NOTE — Progress Notes (Signed)
Pronounced death at 1637 by two RN'S. Asystole in two EKG  leads, 0 breath or heart sounds, no pupil response.

## 2013-11-27 NOTE — Progress Notes (Signed)
  [facility] Progress Note   Subjective: Family asked not to do HD last night. Patient was made DNR last night by cardiology with orders to not advance care. I spoke with pt's mother just now and confirmed that they do not want any further dialysis. Per staff RN the family is waiting for out-of town members to arrive.   Filed Vitals:   11/04/2013 0500 11/14/2013 0600 10/31/2013 0700 11/05/2013 0800  BP:      Pulse: 58 52 52 52  Temp:    100 F (37.8 C)  TempSrc:    Oral  Resp: 21 19 16 17   Height:      Weight: 89.7 kg (197 lb 12 oz)     SpO2: 100% 100% 100% 100%  On vent, sedated Facial edema Chest is clear bilat RRR 2/6 M, no rub or gallop Abd obese, nondistended No LE or UE edema L thigh AVG is patent  Dialysis: [facility] ([facility]) 4.5h  86.5kg  2K/2.25 Bath  L thigh AVG  Heparin 8000 Hect 4    EPO 1600   Venofer 50mg /wk  Assessment 1. Complete heart block, persistent- no access for transvenous PM and too unstable for epicardial lead. On dopamine 2. Cardiogenic shock 3. Acute UGIB, s/p 2u prbc yest 4. Acute resp failure 5. ESRD on hemodialysis, end-stage access 6. NSTEMI 7. 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 Pt DNR, family reportedly planning to withdraw support after out-of town family members arrive.     Vinson Moselleob Keyetta Hollingworth MD pager [phone]    cell [phone] 11/11/2013, 8:46 AM   Recent Labs Lab 11/19/13 0930 11/21/13 0605 11/14/2013 0500  NA 133* 138 134*  K 4.5 4.3 4.3  CL 87* 89* 92*  CO2 24 23 20   GLUCOSE 112* 93 88  BUN 39* 28* 58*  CREATININE 7.46* 6.14* 9.15*  CALCIUM 10.2 10.3 9.9  PHOS  --  6.6* 5.6*    Recent Labs Lab 11/19/13 0410 11/21/13 0605 11/12/2013 0500  AST 15  --   --   ALT 10  --   --   ALKPHOS 120*  --   --   BILITOT 0.4  --   --   PROT 8.2  --   --   ALBUMIN 3.5 3.5 3.0*    Recent Labs Lab 11/21/13 2219 11/16/2013 0430 11/18/2013 0500  WBC 10.6* 11.9*  12.6*  HGB 9.2* 9.6* 9.5*  HCT 27.9* 29.0* 28.3*  MCV 88.9 88.7 87.9  PLT 264 269 270   . antiseptic oral rinse  15 mL Mouth Rinse QID  . chlorhexidine  15 mL Mouth Rinse BID  . Chlorhexidine Gluconate Cloth  6 each Topical Q0600  . mupirocin ointment  1 application Nasal BID  . sodium chloride  3 mL Intravenous Q12H   . DOPamine 20 mcg/kg/min (11/17/2013 0841)  . fentaNYL infusion INTRAVENOUS 200 mcg/hr (11/21/2013 2304)  . midazolam (VERSED) infusion 4 mg/hr (11/20/2013 0840)  . norepinephrine (LEVOPHED) Adult infusion 30 mcg/min (11/14/2013 0530)  . pantoprozole (PROTONIX) infusion 8 mg/hr (11/18/2013 0300)   sodium chloride, acetaminophen, ondansetron (ZOFRAN) IV, sodium chloride

## 2013-11-27 NOTE — Progress Notes (Signed)
Respiratory therapy note- Patient was terminally extubated to room air per family and patients wishes. RN at bedside at this time.

## 2013-11-27 DEATH — deceased

## 2013-11-28 NOTE — Discharge Summary (Signed)
Pt admitted in transfer from [facility] because of complete heart block in the setting of end-stage renal disease and no vascular access. Troponins were borderline positive. Over the ensuing days, rhythm improved somewhat the patient's hypotension became increasingly refractory to pressor support  he had upper GI bleeding respiratory failure with the family decision not to continue hemodialysis.  He subsequently died.

## 2013-12-01 ENCOUNTER — Telehealth: Payer: Self-pay | Admitting: Internal Medicine

## 2013-12-01 NOTE — Telephone Encounter (Signed)
[facility] called [facility] needing to Know If Dr.Klein will Sign Pt's D/C. Dr.Klein agreed To sign  [place] M[facility] Is aware He is in office today (2.5.15)And will be Off  Friday Returning Monday (2.9.15) . She Stated D/C Will be Dropped Off today.  Sherri aware. [facility] # [phone]

## 2013-12-02 ENCOUNTER — Telehealth: Payer: Self-pay | Admitting: Internal Medicine

## 2013-12-02 NOTE — Telephone Encounter (Signed)
Death Certificate Picked Up 2.6.15/kdm

## 2013-12-02 NOTE — Telephone Encounter (Signed)
Dr.Klein Signed D/C [facility] Aware Ready For Pick up 2.6.15/kdm

## 2013-12-13 IMAGING — XA IR DECLOT *L*
1 series · 12 of 24 positions shown · non-contrast
Comparison: none

Clinical Data/Indication: Thrombosed AVG. Left thigh

DIALYSIS GRAFT THROMBECTOMY, ULTRASOUND VENOUS ACCESS, PTA VENOUS
Sedation: Versed three mg, Fentanyl 150 mcg.
Total Moderate Sedation Time: 60 minutes.
Contrast Volume: 50 ml [id number].
Additional Medications: Heparin 2444 units.  TPA 2 mg..
Fluoroscopy Time: 9.0 minutes.
Procedure: The procedure, risks, benefits, and alternatives were
explained to the patient. Questions regarding the procedure were
encouraged and answered. The patient understands and consents to
the procedure.
The left thigh and was prepped with betadine in a sterile fashion,
and a sterile drape was applied covering the operative field. A
sterile gown and sterile gloves were used for the procedure.
The graft was noted to be occluded  initially with ultrasound.
Under sonographic guidance, a micropuncture needle was inserted
into the graft (Ultrasound image documentation was performed)
directed towards the venous anastomosis.  It was removed over a 018
wire.  A 5-French transitional dilator was inserted.  1 mg t-PA was
injected.  The dilator was exchanged over a Benson wire for a 6-
French sheath.  The identical procedure was performed towards the
arterial anastomosis.
The Kumpe catheter was utilized to advance the Kumpe catheter
across both the arterial and venous anastomoses.  Central
venography was performed.
A six mm angioplasty device was utilized to dilate the venous limb
of the graft, the venous anastomosis.  The Fogarty balloon was then
utilized to sweep the arterial and venous limbs of the graft.
Final imaging was performed.  Sheaths were removed and hemostasis
was achieved with two figure-of-eight O Prolene stitches.

[Series 1: run · 12 of 39 slices shown]
[im 2/39]
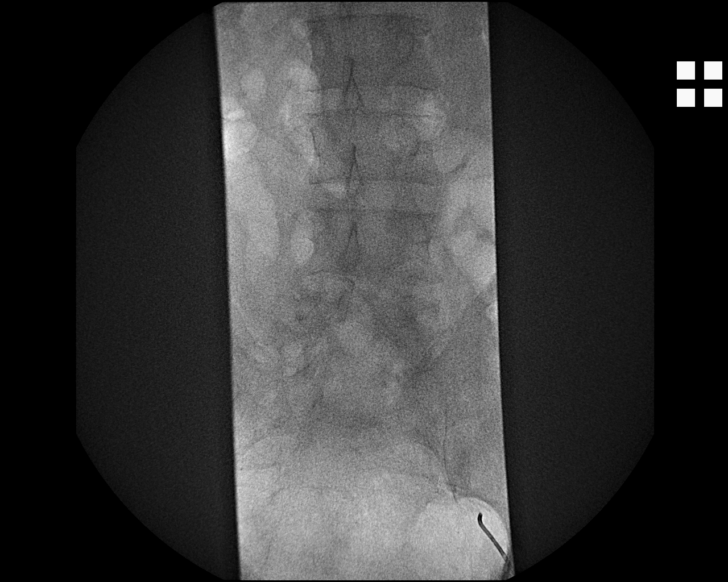
[im 5/39]
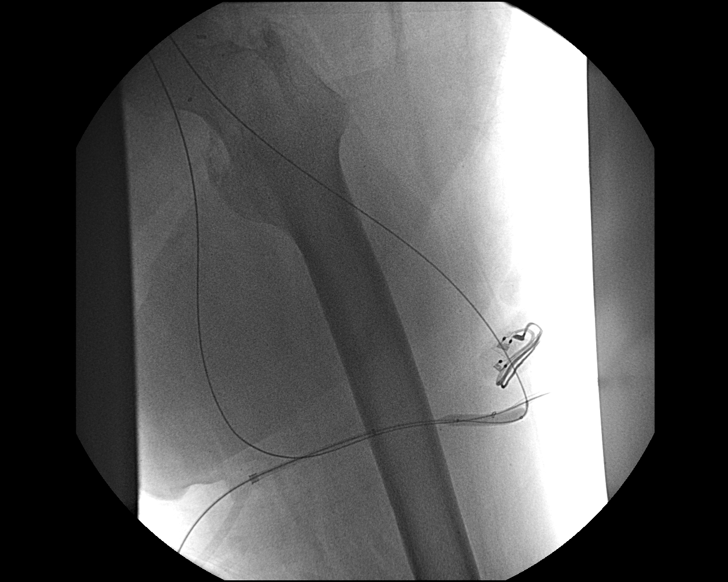
[im 9/39]
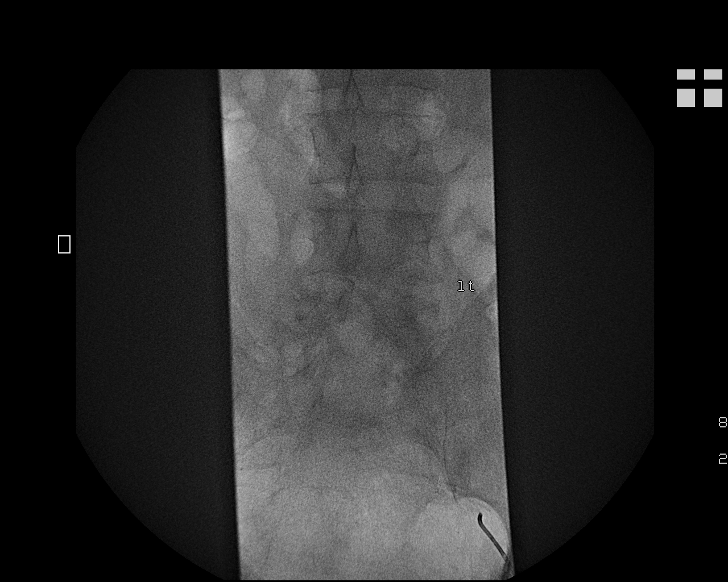
[im 12/39]
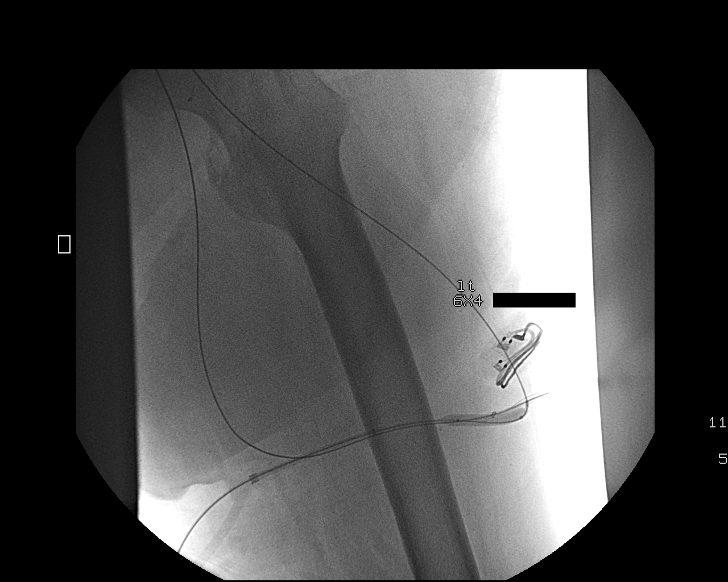
[im 15/39]
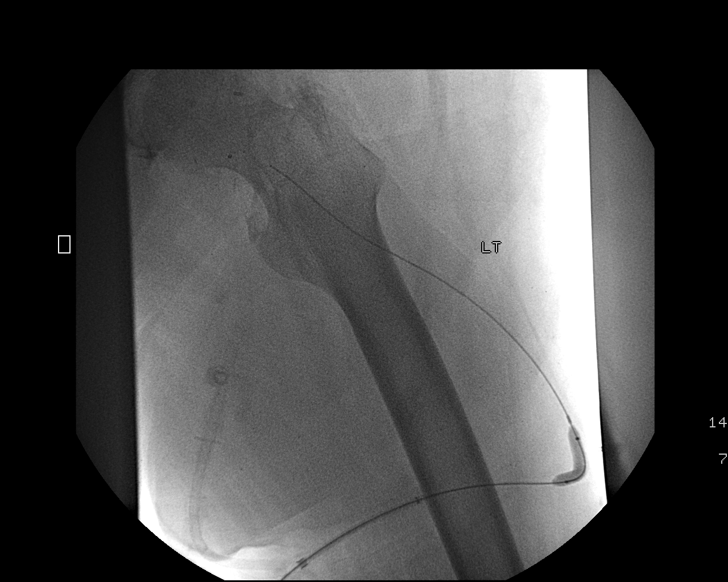
[im 19/39]
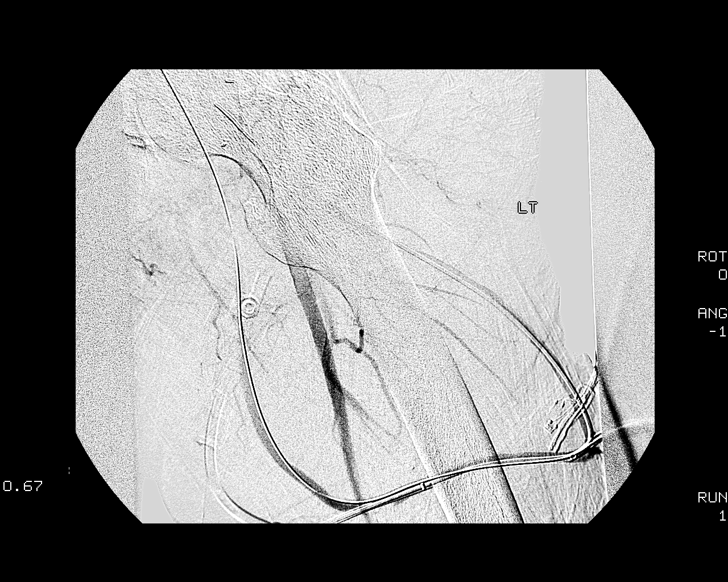
[im 22/39]
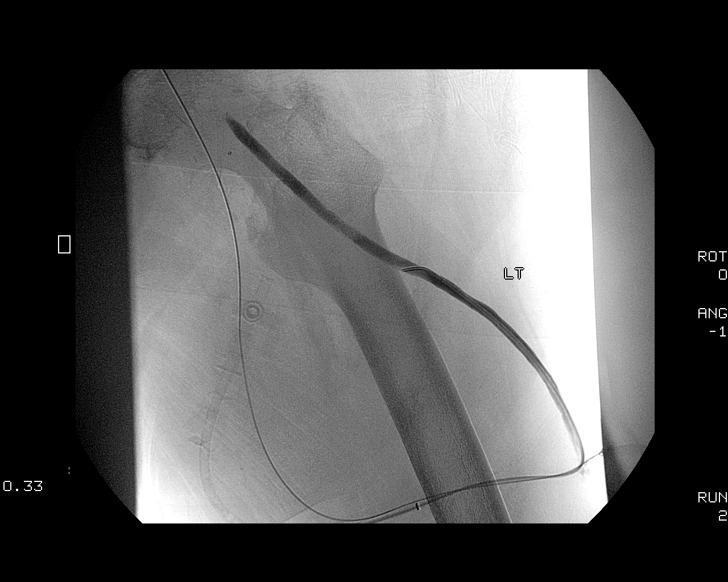
[im 25/39]
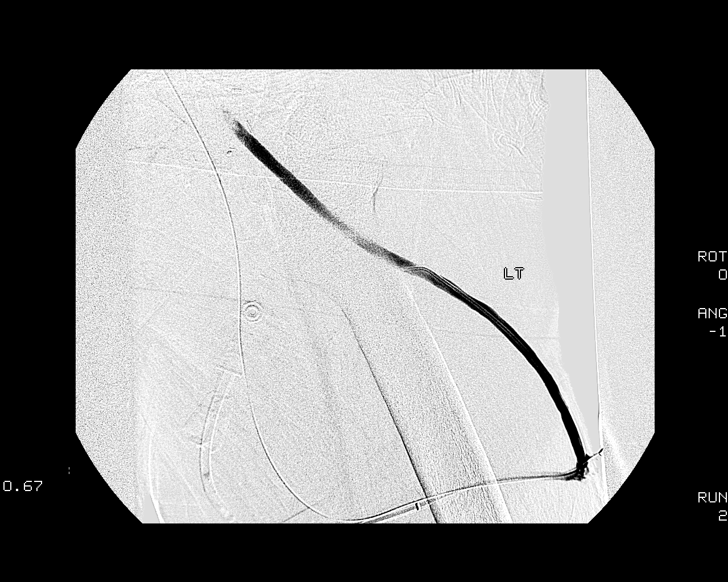
[im 29/39]
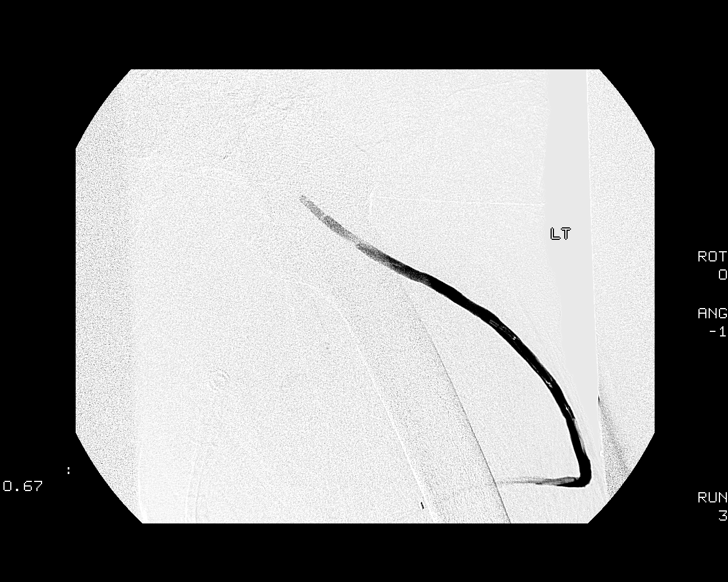
[im 32/39]
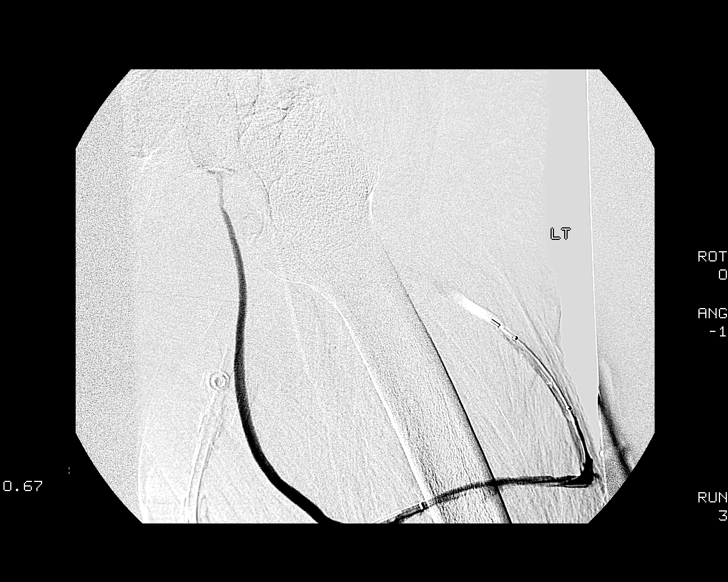
[im 35/39]
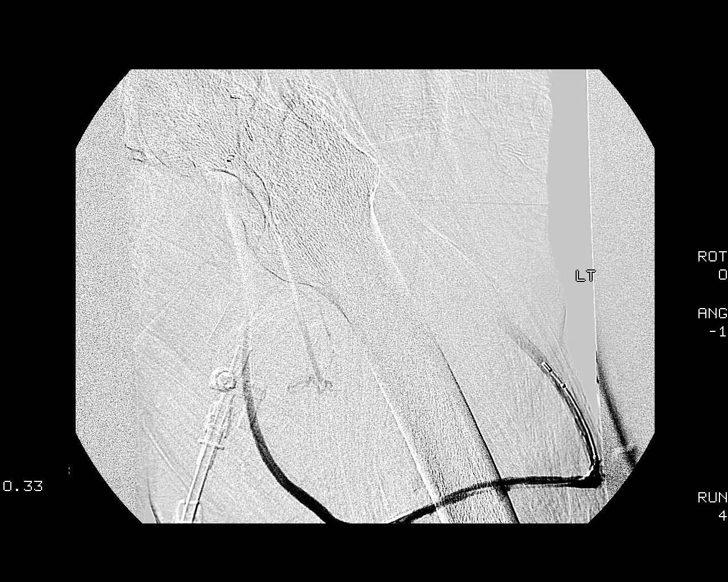
[im 39/39]
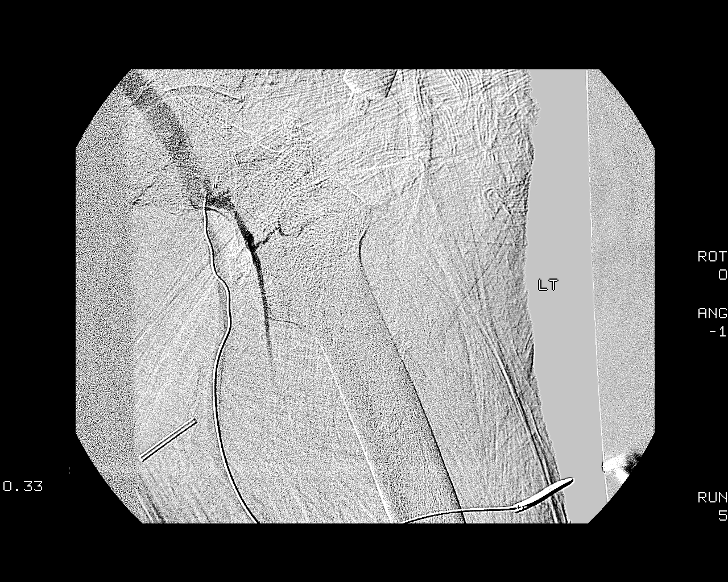

[12 of 24 positions shown; findings below may reference images not displayed]

FINDINGS: Central venography demonstrates patency.

Post thrombectomy imaging demonstrates wide patency of the AV
graft.

Complications: [None]
IMPRESSION: Successful left thigh AV graft thrombectomy and dilatation of the
venous anastomosis to 6 mm.

## 2014-02-10 ENCOUNTER — Other Ambulatory Visit (HOSPITAL_COMMUNITY): Payer: Medicare Other

## 2014-02-10 ENCOUNTER — Ambulatory Visit: Payer: Medicare Other | Admitting: Vascular Surgery

## 2014-10-05 ENCOUNTER — Encounter (HOSPITAL_COMMUNITY): Payer: Self-pay | Admitting: Vascular Surgery

## 2014-11-09 ENCOUNTER — Encounter (HOSPITAL_COMMUNITY): Payer: Self-pay | Admitting: Internal Medicine

## 2015-02-17 NOTE — Consult Note (Signed)
Brief Consult Note: Diagnosis: COMPLETEHEART BLOCK//+ troponin//  MIN VENOUS ACCESS  ONLY L Femoral and this is used for dialysis.   Patient was seen by consultant.   Discussed with Attending MD.   Comments: will write full note in EPIC no venous access for pacing + troponin with known CAD  + MI evident of Myoview 2004 with EF 44% perhaps CHB with narrow QRS assoc with MI will transfer to [facility]pate observation for a few days to see if conduction recovers will need CVTS consult for epicardial pacer if not contnue heparin BP chronically low and now lower cause not clear although could be related to loss of AV synchrony.  Electronic Signatures: Duke SalviaKlein, Steven C (MD)  (Signed 23-Jan-15 13:21)  Authored: Brief Consult Note   Last Updated: 23-Jan-15 13:21 by Duke SalviaKlein, Steven C (MD)
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
## 2015-02-17 NOTE — H&P (Signed)
PATIENT NAME:  [name], [name] MR#:  [id number] DATE OF BIRTH:  09/23/1966  DATE OF ADMISSION:  11/16/2013  PRIMARY CARE PHYSICIAN: Dr. Truitt Leep (?), who is a kidney specialist.   CHIEF COMPLAINT: Lightheadedness and hip pain.   HISTORY OF PRESENT ILLNESS: This is a 49 year old male with end-stage renal disease on hemodialysis who, over the past few days, has been complaining of lightheaded. Actually yesterday after dialysis on his way home, he felt very lightheaded and apparently he felt lightheaded all day and throughout this morning.   Today when he was trying to reach for something on the couch in his wheelchair, he felt lightheaded and ended up falling on his left hip. EMS was called.When EMS arrived, he was hypotensive and bradycardic. Here, his blood pressures were systolic in the 58P, heart rates were 39 to 42. Dr. Humphrey Rolls has seen the patient. On EKG, it looks like he has a Mobitz type I. He is placed on a dopamine drip. The patient received fentanyl right before I saw the patient, so he is very lethargic; however, earlier today, he was mentating well and talking to the medical staff here in the hospital.   REVIEW OF SYSTEMS:  CONSTITUTIONAL: Denies any fever. Positive fatigue, weakness. He has got pain in the left hip.  EYES: He is legally blind.  ENT: No hearing loss, epistaxis, or ear pain.  RESPIRATORY: No cough, wheezing, hemoptysis, COPD.  CARDIOVASCULAR: He denies any chest pain, palpitations, orthopnea. He denies any syncope. He has a history of atrial fibrillation on Coumadin.  GASTROINTESTINAL: No nausea, vomiting, diarrhea, abdominal pain, melena or ulcers.  GENITOURINARY: No dysuria or hematuria.  ENDOCRINE: No heat or cold intolerance.  HEMOLYMPHATIC: Positive easy anemia and easy bruising.  SKIN: No rashes.  MUSCULOSKELETAL: He is wheelchair bound. He has got contractures.  NEUROLOGIC: No history of CVA, TIA, or seizures.  PSYCHIATRIC: No history of anxiety or  depression.   PAST MEDICAL HISTORY:  1.  End-stage renal disease on hemodialysis.  2.  Hypotension. The patient is on midodrine.  3.  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  4.  History of antiphospholipid antibody syndrome.  5.  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  6.  Chronic atrial fibrillation.  7.  Hyperlipidemia.  8.  GERD.  9.  Secondary hyperparathyroidism.  10.  Coronary artery disease, status post MI in 2000.  11.  History of infected peritoneal dialysis catheter.   ALLERGIES: ANCEF.   PAST SURGICAL HISTORY:  1.  Left nephrectomy in 2007 for unspecified renal mass.  2.  Multiple surgeries for access and fistula creation, both arms, which have failed.  3.  Coronary stenting.   MEDICATIONS:  1.  Zocor 40 mg daily.  2.  Renvela 800 mg t.i.d.  3.  Nephro-Vite 1 tablet daily.  4.  Midodrine 10 mg prior to dialysis.  5.  Aspirin 81 mg daily.  6.  Coumadin 2.5 mg daily, Tuesday/Thursday/Saturday, and 5 mg Monday/Wednesday/Friday/Sunday.  7.  Ativan 2 mg b.i.d. p.r.n.  8.  Hydroxyzine p.r.n. itching q.6 hours.  9.  Ambien 5 mg at bedtime p.r.n. sleep.  10.  Famotidine 20 mg daily.    FAMILY HISTORY: Per the old chart review, hypertension and history of lung cancer.   SOCIAL HISTORY: The patient lives alone. No tobacco, alcohol or drug use.   PHYSICAL EXAMINATION:  VITAL SIGNS: Temperature is 98, pulse is 39 to 44, respirations 18 to 20, blood pressure 79 to 88 over  39 to 50. Pulse oximetry is 99% on room air.  GENERAL: Lethargic. He responds to questions; however, apparently, he was more awake. He did receive 25 mcg of fentanyl for his left hip pain.  HEENT: Head is atraumatic.  No scleral icterus. No drainage or external lesions in the ears. No lesions or exudates in the mouth.  NECK: Supple and symmetric. No masses. Thyroid is midline, not enlarged. No JVD. Nontender.  LUNGS: Clear to auscultation without crackles, rales, rhonchi  or wheezing. Normal symmetrical expansion. No egophony or tactile fremitus.  CARDIOVASCULAR: Very slow heart rate, bradycardia. No murmurs, gallops, or rubs. Cardiac palpation  is within normal limits.  GASTROINTESTINAL: Bowel sounds positive. Nontender, nondistended. No hepatosplenomegaly. No rebound, guarding or rigidity.  EXTREMITIES: He has got contractures at his hips. He has got a large scar on the left thigh where he receives dialysis.  SKIN: Inspection is within normal limits except for the large scar on the left thigh.  NEUROLOGIC: The patient is lethargic. He is able to move all extremities. He is cooperative and answers to the best of his ability. He answers all questions.   LABORATORIES: White blood cells 8, hemoglobin 13, hematocrit 40.2, platelets 111. CK is 74. CPK-MB 2.7. Troponin is 0.15. Sodium 134, potassium 4.2, chloride 96, bicarb 24, BUN 28, creatinine 6.05. Glucose is 91. Alk phos 168, bilirubin 0.4, calcium 10.2, ALT 22, AST 24, total protein 9.4. Albumin is 4.1.   Chest x-ray shows no acute cardiopulmonary disease.   Pelvis x-ray shows no fracture.   BNP 6752.   EKG shows Mobitz type I.   ASSESSMENT AND PLAN: A 49 year old male who has been complaining of dizziness and lightheaded for the past 2 days. He had an accidental fall, called EMS, and was incidentally found to have bradycardia and hypotension.   1.  Bradycardia with Mobitz type I on EKG. The patient will be admitted to critical care unit. Will check a TSH. He is not on any medications that could cause this. Dr. Humphrey Rolls has been consulted. The patient may likely need a pacemaker as he is symptomatic from his bradycardia. We will continue to monitor very closely.  2.  Hypotension. Will provide some very gentle hydration, watching fluid status as he is a dialysis patient. Does not appear that the patient is on any blood pressure medications at this time. He is on dopamine which we will continue.  3.  End-stage renal  disease, on hemodialysis. We will consult nephrology.  4.  Elevated troponin, likely secondary to poor renal clearance from his end-stage renal disease or hypotension. We will continue to monitor his troponins. Cardiology is consulted.  5.  History of superior vena cava syndrome and atrial fibrillation. We will hold Coumadin therapy for now as the patient may require pacemaker. I have started heparin gtt. We will also check an INR as it has not been checked here in the Emergency Room.  6.  Lethargy. This is secondary to fentanyl. Hold any IV pain medication due to the blood pressure and IV fentanyl for now.  7.  Thrombocytopenia. We will continue to closely monitor as the patient may be on heparin.   CODE STATUS:  FULL CODE.  CRITICAL CARE TIME SPENT: Approximately 57  minutes.   ____________________________ Donell Beers. Benjie Karvonen, MD spm:np D: 11/16/2013 14:18:55 ET T: 11/16/2013 15:36:33 ET JOB#: [id number]  cc: Teia Freitas P. Benjie Karvonen, MD, <Dictator> Dr. Truitt Leep, [facility] Manuella Blackson P Akita Maxim MD ELECTRONICALLY SIGNED 11/16/2013 16:53

## 2015-02-17 NOTE — Discharge Summary (Signed)
PATIENT NAME:  [name], [name] MR#:  [name] DATE OF BIRTH:  [birth year]  DATE OF ADMISSION:  11/16/2013 DATE OF TRANSFER:  July 20, 2014  TRANSFER DIAGNOSES:  Includes:  1.  Mobitz type I and AV dissociation block, symptomatic bradycardia.  2.  Hypotension.  3.  End-stage renal disease, on hemodialysis.  4.  Anemia of chronic disease.  5.  Chronic hyponatremia.  6.  Non-ST segment elevation myocardial infarction.   CONSULTANTS: 1.  Dr. Welton FlakesKhan, Dr. Juel BurrowMasoud and Dr. Graciela HusbandsKlein of cardiology.  2.  Dr. Cherylann RatelLateef with nephrology.   ADMITTING HISTORY AND PHYSICAL AND HOSPITAL COURSE:  Please see detailed H and P dictated previously. In brief, a 49 year old male patient presented to the hospital sent in from the dialysis center after the patient had lightheadedness, some hip pain, was found to be hypotensive and bradycardic started on a dopamine drip, admitted to the hospitalist service on the CCU. The patient initially had Mobitz type 1 prior to dopamine but after starting dopamine, the patient had AV dissociation continuing to be bradycardic. Initially plan was to get a pacemaker placement [facility] at [facility]. Dr. Juel BurrowMasoud and Dr. Graciela HusbandsKlein were consulted from cardiology. But considering the patient's poor venous access, Dr. Graciela HusbandsKlein recommended the patient be transferred to a tertiary care center for pacemaker placement by cardiothoracic surgery. [facility] Dr. Graciela HusbandsKlein. I discussed the case with the patient's family and Dr. Graciela HusbandsKlein on the day of discharge.   Prior to discharge, the patient's vitals showed temperature 98.5, pulse of 50, blood pressure 98/42, saturating 98% on room air.   The patient also had NSTEMI with elevated troponin for which he has been on a heparin drip. Possibly his bradycardia could be from right-sided NSTEMI. The patient is on aspirin, statin, heparin drip. No beta blockers secondary to bradycardia.   Time spent on this  critically ill patient on day of discharge was 50 minutes.  ____________________________ [name], MD srs:ce D: 11/19/2013 14:35:44 ET T: 11/19/2013 18:53:25 ET JOB#: [id number]  cc: Wardell HeathSrikar [name]. Dayne Dekay, MD, <Dictator> Orie FishermanSRIKAR [name] Kawanda Drumheller MD ELECTRONICALLY SIGNED 11/21/2013 10:19
